# Patient Record
Sex: Female | Born: 1940 | Race: Black or African American | Hispanic: No | Smoking: Former smoker
Health system: Southern US, Community
[De-identification: ages and names within clinical notes are randomized; demographics above are authoritative.]

## PROBLEM LIST (undated history)

## (undated) DIAGNOSIS — E785 Hyperlipidemia, unspecified: Secondary | ICD-10-CM

## (undated) DIAGNOSIS — I10 Essential (primary) hypertension: Secondary | ICD-10-CM

## (undated) DIAGNOSIS — M1990 Unspecified osteoarthritis, unspecified site: Secondary | ICD-10-CM

## (undated) DIAGNOSIS — D333 Benign neoplasm of cranial nerves: Secondary | ICD-10-CM

## (undated) DIAGNOSIS — K8020 Calculus of gallbladder without cholecystitis without obstruction: Secondary | ICD-10-CM

## (undated) DIAGNOSIS — N62 Hypertrophy of breast: Secondary | ICD-10-CM

## (undated) DIAGNOSIS — T4145XA Adverse effect of unspecified anesthetic, initial encounter: Secondary | ICD-10-CM

## (undated) DIAGNOSIS — T7840XA Allergy, unspecified, initial encounter: Secondary | ICD-10-CM

## (undated) DIAGNOSIS — K5730 Diverticulosis of large intestine without perforation or abscess without bleeding: Secondary | ICD-10-CM

## (undated) DIAGNOSIS — T8859XA Other complications of anesthesia, initial encounter: Secondary | ICD-10-CM

## (undated) HISTORY — DX: Essential (primary) hypertension: I10

## (undated) HISTORY — DX: Hyperlipidemia, unspecified: E78.5

## (undated) HISTORY — DX: Benign neoplasm of cranial nerves: D33.3

## (undated) HISTORY — PX: TUBAL LIGATION: SHX77

## (undated) HISTORY — DX: Allergy, unspecified, initial encounter: T78.40XA

## (undated) HISTORY — PX: JOINT REPLACEMENT: SHX530

## (undated) HISTORY — DX: Diverticulosis of large intestine without perforation or abscess without bleeding: K57.30

## (undated) HISTORY — DX: Calculus of gallbladder without cholecystitis without obstruction: K80.20

## (undated) HISTORY — DX: Unspecified osteoarthritis, unspecified site: M19.90

---

## 1980-11-26 HISTORY — PX: CHOLECYSTECTOMY: SHX55

## 2003-11-27 HISTORY — PX: TONSILLECTOMY: SUR1361

## 2003-11-27 HISTORY — PX: REPLACEMENT TOTAL KNEE BILATERAL: SUR1225

## 2003-11-27 HISTORY — PX: OTHER SURGICAL HISTORY: SHX169

## 2005-11-26 HISTORY — PX: OTHER SURGICAL HISTORY: SHX169

## 2006-07-27 HISTORY — PX: OTHER SURGICAL HISTORY: SHX169

## 2006-12-04 ENCOUNTER — Ambulatory Visit: Payer: Self-pay | Admitting: Family Medicine

## 2006-12-27 HISTORY — PX: OTHER SURGICAL HISTORY: SHX169

## 2006-12-31 ENCOUNTER — Ambulatory Visit: Payer: Self-pay | Admitting: Family Medicine

## 2007-01-20 ENCOUNTER — Ambulatory Visit: Payer: Self-pay | Admitting: Family Medicine

## 2007-01-20 LAB — CONVERTED CEMR LAB
HDL: 43.2 mg/dL (ref >39.0)
Total CHOL/HDL Ratio: 4.4
Triglycerides: 56 mg/dL (ref 0–149)
VLDL: 11 mg/dL (ref 0–40)

## 2007-02-13 ENCOUNTER — Ambulatory Visit: Payer: Self-pay | Admitting: Internal Medicine

## 2007-02-18 ENCOUNTER — Other Ambulatory Visit: Admission: RE | Admit: 2007-02-18 | Discharge: 2007-02-18 | Payer: Self-pay | Admitting: Family Medicine

## 2007-02-18 ENCOUNTER — Encounter: Payer: Self-pay | Admitting: Family Medicine

## 2007-02-18 ENCOUNTER — Ambulatory Visit: Payer: Self-pay | Admitting: Family Medicine

## 2007-02-25 ENCOUNTER — Encounter: Payer: Self-pay | Admitting: Family Medicine

## 2007-02-25 ENCOUNTER — Ambulatory Visit: Payer: Self-pay | Admitting: Internal Medicine

## 2007-02-25 LAB — CONVERTED CEMR LAB: Pap Smear: NORMAL

## 2007-05-12 ENCOUNTER — Ambulatory Visit: Payer: Self-pay | Admitting: Family Medicine

## 2007-05-28 ENCOUNTER — Telehealth (INDEPENDENT_AMBULATORY_CARE_PROVIDER_SITE_OTHER): Payer: Self-pay | Admitting: *Deleted

## 2007-06-02 ENCOUNTER — Encounter: Payer: Self-pay | Admitting: Family Medicine

## 2007-07-18 ENCOUNTER — Ambulatory Visit: Payer: Self-pay | Admitting: Family Medicine

## 2007-07-18 DIAGNOSIS — R229 Localized swelling, mass and lump, unspecified: Secondary | ICD-10-CM

## 2007-08-01 ENCOUNTER — Telehealth: Payer: Self-pay | Admitting: Family Medicine

## 2007-08-01 ENCOUNTER — Ambulatory Visit: Payer: Self-pay | Admitting: Family Medicine

## 2007-08-22 ENCOUNTER — Encounter: Payer: Self-pay | Admitting: Family Medicine

## 2007-09-19 ENCOUNTER — Ambulatory Visit: Payer: Self-pay | Admitting: Family Medicine

## 2007-09-22 ENCOUNTER — Encounter: Payer: Self-pay | Admitting: Family Medicine

## 2007-09-22 DIAGNOSIS — K5730 Diverticulosis of large intestine without perforation or abscess without bleeding: Secondary | ICD-10-CM | POA: Insufficient documentation

## 2007-09-22 DIAGNOSIS — E7800 Pure hypercholesterolemia, unspecified: Secondary | ICD-10-CM | POA: Insufficient documentation

## 2007-09-22 DIAGNOSIS — J309 Allergic rhinitis, unspecified: Secondary | ICD-10-CM | POA: Insufficient documentation

## 2007-09-22 DIAGNOSIS — I8390 Asymptomatic varicose veins of unspecified lower extremity: Secondary | ICD-10-CM | POA: Insufficient documentation

## 2007-09-22 DIAGNOSIS — J4520 Mild intermittent asthma, uncomplicated: Secondary | ICD-10-CM | POA: Insufficient documentation

## 2007-09-22 DIAGNOSIS — E669 Obesity, unspecified: Secondary | ICD-10-CM | POA: Insufficient documentation

## 2007-09-22 DIAGNOSIS — M1990 Unspecified osteoarthritis, unspecified site: Secondary | ICD-10-CM

## 2007-09-22 DIAGNOSIS — I10 Essential (primary) hypertension: Secondary | ICD-10-CM | POA: Insufficient documentation

## 2007-09-22 DIAGNOSIS — I83819 Varicose veins of unspecified lower extremities with pain: Secondary | ICD-10-CM | POA: Insufficient documentation

## 2007-09-24 ENCOUNTER — Ambulatory Visit: Payer: Self-pay | Admitting: Family Medicine

## 2007-09-25 DIAGNOSIS — M5386 Other specified dorsopathies, lumbar region: Secondary | ICD-10-CM | POA: Insufficient documentation

## 2007-11-06 ENCOUNTER — Encounter: Payer: Self-pay | Admitting: Family Medicine

## 2008-03-23 ENCOUNTER — Ambulatory Visit: Payer: Self-pay | Admitting: Family Medicine

## 2008-03-23 DIAGNOSIS — R300 Dysuria: Secondary | ICD-10-CM | POA: Insufficient documentation

## 2008-03-23 LAB — CONVERTED CEMR LAB
Bacteria, UA: 0
Epithelial cells, urine: 0 /lpf
Ketones, urine, test strip: NEGATIVE
Mucus, UA: 0
Nitrite: NEGATIVE
RBC / HPF: 0
Specific Gravity, Urine: 1.015
Urine crystals, microscopic: 0 /hpf
Urobilinogen, UA: NEGATIVE

## 2008-03-24 ENCOUNTER — Ambulatory Visit: Payer: Self-pay | Admitting: Family Medicine

## 2008-03-26 LAB — CONVERTED CEMR LAB
ALT: 18 units/L (ref 0–35)
AST: 18 units/L (ref 0–37)
Bilirubin, Direct: 0.1 mg/dL (ref 0.0–0.3)
CO2: 31 meq/L (ref 19–32)
Calcium: 9.2 mg/dL (ref 8.4–10.5)
Chloride: 101 meq/L (ref 96–112)
Glucose, Bld: 81 mg/dL (ref 70–99)
HDL: 33.6 mg/dL — ABNORMAL LOW (ref >39.0)
Sodium: 138 meq/L (ref 135–145)
Total Bilirubin: 0.5 mg/dL (ref 0.3–1.2)
Total CHOL/HDL Ratio: 5.7
Total Protein: 7.5 g/dL (ref 6.0–8.3)
Triglycerides: 63 mg/dL (ref 0–149)

## 2008-03-31 ENCOUNTER — Encounter: Payer: Self-pay | Admitting: Family Medicine

## 2008-06-29 ENCOUNTER — Ambulatory Visit: Payer: Self-pay | Admitting: Family Medicine

## 2008-07-05 LAB — CONVERTED CEMR LAB
ALT: 18 units/L (ref 0–35)
AST: 20 units/L (ref 0–37)
LDL Cholesterol: 127 mg/dL — ABNORMAL HIGH (ref 0–99)
Total CHOL/HDL Ratio: 5

## 2008-07-07 ENCOUNTER — Encounter: Payer: Self-pay | Admitting: Family Medicine

## 2008-08-23 ENCOUNTER — Ambulatory Visit: Payer: Self-pay | Admitting: Family Medicine

## 2008-09-08 ENCOUNTER — Encounter: Payer: Self-pay | Admitting: Family Medicine

## 2008-09-16 ENCOUNTER — Telehealth: Payer: Self-pay | Admitting: Family Medicine

## 2008-09-21 ENCOUNTER — Ambulatory Visit: Payer: Self-pay | Admitting: Family Medicine

## 2008-10-26 ENCOUNTER — Encounter: Payer: Self-pay | Admitting: Family Medicine

## 2008-11-03 ENCOUNTER — Encounter (INDEPENDENT_AMBULATORY_CARE_PROVIDER_SITE_OTHER): Payer: Self-pay | Admitting: *Deleted

## 2008-11-23 ENCOUNTER — Ambulatory Visit: Payer: Self-pay | Admitting: Family Medicine

## 2008-11-23 LAB — CONVERTED CEMR LAB
Bilirubin Urine: NEGATIVE
Glucose, Urine, Semiquant: NEGATIVE
Specific Gravity, Urine: 1.01
WBC Urine, dipstick: NEGATIVE
pH: 6.5

## 2009-01-12 ENCOUNTER — Ambulatory Visit: Payer: Self-pay | Admitting: Family Medicine

## 2009-01-12 LAB — CONVERTED CEMR LAB
Cholesterol: 176 mg/dL (ref 0–200)
VLDL: 18 mg/dL (ref 0–40)

## 2009-03-19 ENCOUNTER — Encounter: Payer: Self-pay | Admitting: Family Medicine

## 2009-03-21 ENCOUNTER — Telehealth: Payer: Self-pay | Admitting: Family Medicine

## 2009-03-22 ENCOUNTER — Ambulatory Visit: Payer: Self-pay | Admitting: Family Medicine

## 2009-03-22 LAB — CONVERTED CEMR LAB
Bilirubin Urine: NEGATIVE
Glucose, Urine, Semiquant: NEGATIVE
Ketones, urine, test strip: NEGATIVE
Specific Gravity, Urine: 1.025

## 2009-03-28 ENCOUNTER — Ambulatory Visit: Payer: Self-pay | Admitting: Family Medicine

## 2009-03-30 ENCOUNTER — Ambulatory Visit: Payer: Self-pay | Admitting: Gastroenterology

## 2009-03-30 DIAGNOSIS — R1031 Right lower quadrant pain: Secondary | ICD-10-CM

## 2009-03-31 ENCOUNTER — Encounter: Payer: Self-pay | Admitting: Gastroenterology

## 2009-03-31 ENCOUNTER — Telehealth: Payer: Self-pay | Admitting: Gastroenterology

## 2009-03-31 LAB — CONVERTED CEMR LAB
Basophils Absolute: 0.1 10*3/uL (ref 0.0–0.1)
Eosinophils Absolute: 0.1 10*3/uL (ref 0.0–0.7)
Hemoglobin: 13.6 g/dL (ref 12.0–15.0)
INR: 1.1 — ABNORMAL HIGH (ref 0.8–1.0)
Lymphocytes Relative: 37.8 % (ref 12.0–46.0)
MCHC: 33 g/dL (ref 30.0–36.0)
MCV: 80.3 fL (ref 78.0–100.0)
Monocytes Absolute: 0.4 10*3/uL (ref 0.1–1.0)
Neutro Abs: 3.8 10*3/uL (ref 1.4–7.7)
Nitrite: POSITIVE
RDW: 14.5 % (ref 11.5–14.6)
Specific Gravity, Urine: 1.015 (ref 1.000–1.030)
Total Protein, Urine: NEGATIVE mg/dL
Urine Glucose: NEGATIVE mg/dL
pH: 6 (ref 5.0–8.0)

## 2009-04-04 ENCOUNTER — Telehealth: Payer: Self-pay | Admitting: Gastroenterology

## 2009-04-07 ENCOUNTER — Telehealth: Payer: Self-pay | Admitting: Gastroenterology

## 2009-04-07 ENCOUNTER — Ambulatory Visit (HOSPITAL_COMMUNITY): Admission: RE | Admit: 2009-04-07 | Discharge: 2009-04-07 | Payer: Self-pay | Admitting: Gastroenterology

## 2009-04-07 ENCOUNTER — Encounter: Payer: Self-pay | Admitting: Gastroenterology

## 2009-04-08 ENCOUNTER — Ambulatory Visit: Payer: Self-pay | Admitting: Family Medicine

## 2009-05-09 ENCOUNTER — Ambulatory Visit: Payer: Self-pay | Admitting: Family Medicine

## 2009-05-09 DIAGNOSIS — M25559 Pain in unspecified hip: Secondary | ICD-10-CM | POA: Insufficient documentation

## 2009-05-10 ENCOUNTER — Ambulatory Visit: Payer: Self-pay | Admitting: Internal Medicine

## 2009-05-10 ENCOUNTER — Encounter: Payer: Self-pay | Admitting: Family Medicine

## 2009-05-10 ENCOUNTER — Encounter: Admission: RE | Admit: 2009-05-10 | Discharge: 2009-05-10 | Payer: Self-pay | Admitting: Family Medicine

## 2009-05-13 ENCOUNTER — Telehealth: Payer: Self-pay | Admitting: Family Medicine

## 2009-05-23 DIAGNOSIS — M8580 Other specified disorders of bone density and structure, unspecified site: Secondary | ICD-10-CM

## 2009-05-24 ENCOUNTER — Encounter (INDEPENDENT_AMBULATORY_CARE_PROVIDER_SITE_OTHER): Payer: Self-pay | Admitting: *Deleted

## 2009-08-11 ENCOUNTER — Telehealth: Payer: Self-pay | Admitting: Family Medicine

## 2009-09-07 ENCOUNTER — Ambulatory Visit: Payer: Self-pay | Admitting: Family Medicine

## 2009-11-24 ENCOUNTER — Telehealth: Payer: Self-pay | Admitting: Family Medicine

## 2010-02-10 ENCOUNTER — Ambulatory Visit: Payer: Self-pay | Admitting: Family Medicine

## 2010-02-10 DIAGNOSIS — J40 Bronchitis, not specified as acute or chronic: Secondary | ICD-10-CM

## 2010-06-13 ENCOUNTER — Ambulatory Visit: Payer: Self-pay | Admitting: Family Medicine

## 2010-06-13 ENCOUNTER — Telehealth (INDEPENDENT_AMBULATORY_CARE_PROVIDER_SITE_OTHER): Payer: Self-pay | Admitting: *Deleted

## 2010-06-13 LAB — CONVERTED CEMR LAB
ALT: 15 units/L (ref 0–35)
AST: 19 units/L (ref 0–37)
BUN: 17 mg/dL (ref 6–23)
CO2: 33 meq/L — ABNORMAL HIGH (ref 19–32)
Chloride: 103 meq/L (ref 96–112)
Cholesterol: 190 mg/dL (ref 0–200)
Creatinine, Ser: 0.7 mg/dL (ref 0.4–1.2)
Glucose, Bld: 81 mg/dL (ref 70–99)
Potassium: 4.2 meq/L (ref 3.5–5.1)
Total Bilirubin: 0.3 mg/dL (ref 0.3–1.2)
Total Protein: 7.5 g/dL (ref 6.0–8.3)
Triglycerides: 62 mg/dL (ref 0.0–149.0)

## 2010-06-26 ENCOUNTER — Ambulatory Visit: Payer: Self-pay | Admitting: Family Medicine

## 2010-06-26 LAB — CONVERTED CEMR LAB

## 2010-07-25 ENCOUNTER — Encounter: Payer: Self-pay | Admitting: Family Medicine

## 2010-07-25 ENCOUNTER — Ambulatory Visit: Payer: Self-pay | Admitting: Family Medicine

## 2010-07-25 LAB — HM MAMMOGRAPHY: HM Mammogram: NORMAL

## 2010-08-03 ENCOUNTER — Encounter (INDEPENDENT_AMBULATORY_CARE_PROVIDER_SITE_OTHER): Payer: Self-pay | Admitting: *Deleted

## 2010-08-03 ENCOUNTER — Encounter: Payer: Self-pay | Admitting: Family Medicine

## 2010-08-30 ENCOUNTER — Ambulatory Visit: Payer: Self-pay | Admitting: Family Medicine

## 2010-11-03 ENCOUNTER — Ambulatory Visit: Payer: Self-pay | Admitting: Family Medicine

## 2010-12-26 NOTE — Assessment & Plan Note (Signed)
Summary: CPX/DLO   Vital Signs:  Patient profile:   70 year old female Height:      62.5 inches Weight:      167.4 pounds BMI:     30.24 Temp:     98.0 degrees F oral Pulse rate:   76 / minute Pulse rhythm:   regular BP sitting:   130 / 86  (left arm) Cuff size:   regular  Vitals Entered By: Benny Lennert CMA [facility]) (June 26, 2010 3:02 PM)  History of Present Illness: Chief complaint adult cpx  The patient is here for annual wellness exam and preventative care.    Has noted left leg peripheral swelling... has venous insufficincy. Has had laser treatment etc.  Has not been wearing compression hose. Intermittant tenderness.  Preventive Screening-Counseling & Management  Caffeine-Diet-Exercise     Diet Comments: healthy     Diet Counseling: to improve diet; diet is suboptimal     Does Patient Exercise: yes     Type of exercise: walking/bike     Exercise (avg: min/session): 30-60     Times/week: 4     Exercise Counseling: to improve exercise regimen  Problems Prior to Update: 1)  Acute Bronchitis  (ICD-466.0) 2)  Osteopenia  (ICD-733.90) 3)  Hip Pain, Right  (ICD-719.45) 4)  Abdominal Pain Right Lower Quadrant  (ICD-789.03) 5)  Need Prophylactic Vaccination&inoculation Flu  (ICD-V04.81) 6)  Dysuria, Chronic  (ICD-788.1) 7)  Varicose Veins, Lower Extremities  (ICD-454.9) 8)  Special Screening For Osteoporosis  (ICD-V82.81) 9)  Sciatica  (ICD-724.3) 10)  Obesity  (ICD-278.00) 11)  Loss, Neural Hearing,unilateral, 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 Take 1 Tablet By Mouth Once A Day 2)  Amlodipine Besylate 10 Mg Tabs (Amlodipine Besylate) .... Take 1 Tablet By Mouth Once A Day 3)  Hydrochlorothiazide 25 Mg Tabs (Hydrochlorothiazide) .... Take 1 Tablet By Mouth Once A Day 4)  Meloxicam 15 Mg Tabs (Meloxicam) .Marland Kitchen.. 1 Tab By Mouth Daily 5)  Proair Hfa 108 (90 Base) Mcg/act Aers (Albuterol Sulfate) .Marland Kitchen.. 1 Inh  Every 4-6 Hours As Needed For Wheeze, Sob  Allergies: 1)  ! * Contrast Dye  Past History:  Past medical, surgical, family and social histories (including risk factors) reviewed, and no changes noted (except as noted below).  Past Medical History: Reviewed history from 03/30/2009 and no changes required. Allergic rhinitis Asthma Diverticulosis, colon Hyperlipidemia Hypertension Osteoarthritis Arthritis Gallstones  Past Surgical History: Reviewed history from 09/22/2007 and no changes required. 2007        MRI brain:  acoustic neuroma (check yearly with MRI) 2005         Bilateral knee replacement                  BTL                  C-Section                  Tonsillectomy 1982  Cholecystectomy 12/2006      PFT's (-)   FEV1/FVC 79%, FEV1 84% 12/2006      CXR 07/2006      Carotid doppler (-)  Family History: Reviewed history from 03/30/2009 and no changes required. Father: Died 76 prostateCA Mother: Died 29, lung CA, CVA Siblings: None No MI < 21  Social History: Reviewed history from 09/22/2007 and no changes required. Former Smoker, 15 PYH (few cigs./day) Alcohol use-no Drug use-no Regular exercise-yes, walks daily Marital Status: Married x 43 years Children: 4, healthy Occupation: Retired, Forensic scientist Diet:  Fruits and veggies, rare FF, no water  Review of Systems General:  Denies fatigue and fever. ENT:  Denies decreased hearing. CV:  Denies chest pain or discomfort. Resp:   Denies shortness of breath. GI:  Denies abdominal pain, bloody stools, constipation, and diarrhea. GU:  Denies dysuria. Neuro:  occ pain in right wrist..no weakness, no numbness. occ back pain. Psych:  Denies anxiety and depression.  Physical Exam  General:  obverweight appearing female in NAD  Ears:  External ear exam shows no significant lesions or deformities.  Otoscopic examination reveals clear canals, tympanic membranes are intact bilaterally without bulging, retraction, inflammation or discharge. Hearing is grossly normal bilaterally. Nose:  External nasal examination shows no deformity or inflammation. Nasal mucosa are pink and moist without lesions or exudates. Mouth:  Oral mucosa and oropharynx without lesions or exudates.  Teeth in good repair. Neck:  no carotid bruit or thyromegaly no cervical or supraclavicular lymphadenopathy  Chest Wall:  No deformities, masses, or tenderness noted. Breasts:  No mass, nodules, thickening, tenderness, bulging, retraction, inflamation, nipple discharge or skin changes noted.   Lungs:  Normal respiratory effort, chest expands symmetrically. Lungs are clear to auscultation, no crackles or wheezes. Heart:  Normal rate and regular rhythm. S1 and S2 normal without gallop, murmur, click, rub or other extra sounds. Abdomen:  Bowel sounds positive,abdomen soft and mild diffse tender without masses, organomegaly or hernias noted. Genitalia:  Pelvic Exam:        External: normal female genitalia without lesions or masses        Vagina: normal without lesions or masses, vaginal atrophy and tightness        Cervix: nml by palpation        Adnexa: normal bimanual exam without masses or fullness        Uterus: normal by palpation        Pap smear: not performed Pulses:  R and L posterior tibial pulses are full and equal bilaterally  Extremities:  2+ left pedal edema.    B varicosities Skin:  Intact without suspicious lesions or rashes Psych:  Cognition  and judgment appear intact. Alert and cooperative with normal attention span and concentration. No apparent delusions, illusions, hallucinations   Impression & Recommendations:  Problem # 1:  VARICOSE VEINS, LOWER EXTREMITIES (ICD-454.9) Prescription for compression hose written.  Elevation  daily above heart. Return to vein doctor as needed.  Problem # 2:  HYPERTENSION (ICD-401.9)  Well controlled. Continue current medication. Encouraged exercise, weight loss, healthy eating habits.  Her updated medication list for this problem includes:    Amlodipine Besylate 10 Mg Tabs (Amlodipine besylate) .Marland Kitchen... Take 1 tablet by mouth once a day    Hydrochlorothiazide 25 Mg Tabs (Hydrochlorothiazide) .Marland Kitchen... Take 1 tablet by mouth once a day  BP today: 130/86 Prior BP: 148/88 (02/10/2010)  Labs Reviewed: K+: 4.2 (06/13/2010) Creat: : 0.7 (06/13/2010)   Chol: 190 (  06/13/2010)   HDL: 46.10 (06/13/2010)   LDL: 132 (06/13/2010)   TG: 62.0 (06/13/2010)  Problem # 3:  HYPERLIPIDEMIA (ICD-272.4) Well controlled with diet and exercsie.   Problem # 4:  Preventive Health Care (ICD-V70.0) Assessment: Comment Only Reviewed preventive care protocols, scheduled due services, and updated immunizations.   Counseled on the importance of diet, exercise, and its role in overall health and mortality. The patient's FH and SH was reviewed, including their home life, tobacco status, and drug and alcohol status.     Complete Medication List: 1)  Bayer Childrens Aspirin 81 Mg Chew (Aspirin) .... Take 1 tablet by mouth once a day 2)  Amlodipine Besylate 10 Mg Tabs (Amlodipine besylate) .... Take 1 tablet by mouth once a day 3)  Hydrochlorothiazide 25 Mg Tabs (Hydrochlorothiazide) .... Take 1 tablet by mouth once a day 4)  Meloxicam 15 Mg Tabs (Meloxicam) .Marland Kitchen.. 1 tab by mouth daily 5)  Proair Hfa 108 (90 Base) Mcg/act Aers (Albuterol sulfate) .Marland Kitchen.. 1 inh  every 4-6 hours as needed for wheeze, sob  Other  Orders: Radiology Referral (Radiology)  Patient Instructions: 1)  Referral Appointment Information 2)  Day/Date: 3)  Time: 4)  Place/MD: 5)  Address: 6)  Phone/Fax: 7)  Patient given appointment information. Information/Orders faxed/mailed.  8)  Contnue healthy eating and regualr exercsie. 9)   Follow up in 1 year for CPX.   Current Allergies (reviewed today): ! * CONTRAST DYE  Last Flu Vaccine:  Fluvax 3+ (09/07/2009 3:37:24 PM) Flu Vaccine Next Due:  1 yr Herpes Zoster Next Due:  Refused Flex Sig Next Due:  Not Indicated Hemoccult Next Due:  Not Indicated Last PAP:  Normal (02/25/2007 9:40:38 AM) PAP Result Date:  06/26/2010 PAP Result:  DVE nml, pap every 3 years, next in 2012 PAP Next Due:  1 yr Last Mammogram:  normal (10/26/2008 10:33:41 PM) Mammogram Next Due:  1 yr     Past Medical History:    Reviewed history from 03/30/2009 and no changes required:       Allergic rhinitis       Asthma       Diverticulosis, colon       Hyperlipidemia       Hypertension       Osteoarthritis       Arthritis       Gallstones  Past Surgical History:    Reviewed history from 09/22/2007 and no changes required:       2007        MRI brain:  acoustic neuroma (check yearly with MRI)       2005         Bilateral knee replacement                        BTL                        C-Section                        Tonsillectomy       1982         Cholecystectomy       12/2006      PFT's (-)   FEV1/FVC 79%, FEV1 84%       12/2006      CXR       07/2006      Carotid doppler (-)

## 2010-12-26 NOTE — Miscellaneous (Signed)
Summary: Clinical Update-Mammogram  Letter mailed to patient advising as instructed.  Linde Gillis CMA [facility])  August 03, 2010 11:22 AM    Clinical Lists Changes  Observations: Added new observation of MAMMO DUE: 07/2011 (08/03/2010 11:21) Added new observation of MAMMOGRAM: normal (07/25/2010 11:21)      Preventive Care Screening  Mammogram:    Date:  07/25/2010    Next Due:  07/2011    Results:  normal

## 2010-12-26 NOTE — Progress Notes (Signed)
----   Converted from flag ---- ---- 06/12/2010 11:17 PM, Kerby Nora MD wrote: CMEt, LIPIDs Dx 401.1   ---- 06/12/2010 1:44 PM, Liane Comber CMA (AAMA) wrote: Pt is scheduled for cpx labs tomorrow, what labs to draw and dx codes? Thanks Tasha ------------------------------

## 2010-12-26 NOTE — Letter (Signed)
Summary: Results Follow up Letter  [place] [facility]  [address] [place], [place] [address]   Phone: [phone]  Fax: [phone]    08/03/2010 MRN: [id number]  [facility] [address] [place], [place]  [address]  Dear Tracy Dixon,  The following are the results of your recent test(s):  Test         Result    Pap Smear:        Normal _____  Not Normal _____ Comments: ______________________________________________________ Cholesterol: LDL(Bad cholesterol):         Your goal is less than:         HDL (Good cholesterol):       Your goal is more than: Comments:  ______________________________________________________ Mammogram:        Normal __X___  Not Normal _____ Comments: Please repeat in one year.  ___________________________________________________________________ Hemoccult:        Normal _____  Not normal _______ Comments:    _____________________________________________________________________ Other Tests:    We routinely do not discuss normal results over the telephone.  If you desire a copy of the results, or you have any questions about this information we can discuss them at your next office visit.   Sincerely,     Hannah Beat, MD

## 2010-12-26 NOTE — Assessment & Plan Note (Signed)
Summary: FLU SHOT/CLE   Nurse Visit   Allergies: 1)  ! * Contrast Dye  Orders Added: 1)  Flu Vaccine 97yrs + MEDICARE PATIENTS [Q2039] 2)  Administration Flu vaccine - MCR [G0008]  Flu Vaccine Consent Questions     Do you have a history of severe allergic reactions to this vaccine? no    Any prior history of allergic reactions to egg and/or gelatin? no    Do you have a sensitivity to the preservative Thimersol? no    Do you have a past history of Guillan-Barre Syndrome? no    Do you currently have an acute febrile illness? no    Have you ever had a severe reaction to latex? no    Vaccine information given and explained to patient? yes    Are you currently pregnant? no    Lot Number:[id number]   Exp Date:05/26/2011   Site Given  Left Deltoid IMu

## 2010-12-26 NOTE — Assessment & Plan Note (Signed)
Summary: COUGH/TIGHT CHEST/DLO   Vital Signs:  Patient profile:   71 year old female Height:      62.5 inches Weight:      168.50 pounds BMI:     30.44 Temp:     97.8 degrees F oral Pulse rate:   66 / minute Pulse rhythm:   regular BP sitting:   130 / 84  (left arm)  Vitals Entered By: Melody Comas (November 03, 2010 3:33 PM) CC: cough, tight chest   History of Present Illness: CC: cough, chest tight  1 1/2 wk h/o coughing as well as this morning chest tightness.  Cough seems to be breaking up but still tight.  Not coughing up sputum but feels stuck in chest.  Some head pressure.  Trouble sleeping at night 2/2 cough.  + sore abd from coughing.    No nasal congestion, sneezing.  No fevesrs/chills, abd pain, n/v/d, rashes, myalgias, arthralgias.  No ear pain or tooth pain  + several sick contacts - husband, son in law, daughter, granddaughter.  Son in law told had bronchitis.  Did have flu shot  + h/o asthma, uses albuterol rarely.  h/o allergies to animals.  No smokers at home.  Current Medications (verified): 1)  Bayer Childrens Aspirin 81 Mg Chew (Aspirin) .... Take 1 Tablet By Mouth Once A Day 2)  Amlodipine Besylate 10 Mg Tabs (Amlodipine Besylate) .... Take 1 Tablet By Mouth Once A Day 3)  Hydrochlorothiazide 25 Mg Tabs (Hydrochlorothiazide) .... Take 1 Tablet By Mouth Once A Day 4)  Proair Hfa 108 (90 Base) Mcg/act Aers (Albuterol Sulfate) .Marland Kitchen.. 1 Inh  Every 4-6 Hours As Needed For Wheeze, Sob  Allergies: 1)  ! * Contrast Dye 2)  ! * Crab Meat  Past History:  Past Medical History: Last updated: 03/30/2009 Allergic rhinitis Asthma Diverticulosis, colon Hyperlipidemia Hypertension Osteoarthritis Arthritis Gallstones  Social History: Former Smoker, 15 PYH (few cigs./day) Alcohol use-no Drug use-no Regular exercise-yes, walks daily Marital Status: Married x 43 years Children: 4, healthy, one Clinical research associate, one pediatrician Occupation: Retired, Teacher, adult education Diet:  Fruits and veggies, rare FF, no water  Review of Systems       per HPI  Physical Exam  General:  overweight appearing female in NAD + cough Head:  sinuses nontender Eyes:  PERRLA, EOMI, no injection Ears:  TMs clear bilaterally Nose:  + congested nares Mouth:  slight pharyngeal erythemat, no exudates Neck:  no carotid bruit or thyromegaly no cervical or supraclavicular lymphadenopathy  Lungs:  good air movement.  + exp wheezing throughout.  normal wob. Heart:  Normal rate and regular rhythm. S1 and S2 normal without gallop, murmur, click, rub or other extra sounds. Pulses:  2+ rad pulses   Impression & Recommendations:  Problem # 1:  ACUTE BRONCHITIS (ICD-466.0) asthmatic bronchitis.  treat with steroid course, albuterol scheduled, and zpack.  red flags to return discussed, supportive care discussed.  Her updated medication list for this problem includes:    Proair Hfa 108 (90 Base) Mcg/act Aers (Albuterol sulfate) .Marland Kitchen... 1 inh  every 4-6 hours as needed for wheeze, sob    Cheratussin Ac 100-10 Mg/8ml Syrp (Guaifenesin-codeine) ..... One teaspoon at night as needed cough    Zithromax Z-pak 250 Mg Tabs (Azithromycin) .Marland Kitchen... Take as directed  Complete Medication List: 1)  Bayer Childrens Aspirin 81 Mg Chew (Aspirin) .... Take 1 tablet by mouth once a day 2)  Amlodipine Besylate 10 Mg Tabs (Amlodipine besylate) .... Take  1 tablet by mouth once a day 3)  Hydrochlorothiazide 25 Mg Tabs (Hydrochlorothiazide) .... Take 1 tablet by mouth once a day 4)  Proair Hfa 108 (90 Base) Mcg/act Aers (Albuterol sulfate) .Marland Kitchen.. 1 inh  every 4-6 hours as needed for wheeze, sob 5)  Prednisone 20 Mg Tabs (Prednisone) .... 2 pills daily for 7 days 6)  Cheratussin Ac 100-10 Mg/61ml Syrp (Guaifenesin-codeine) .... One teaspoon at night as needed cough 7)  Zithromax Z-pak 250 Mg Tabs (Azithromycin) .... Take as directed  Patient Instructions: 1)  I think you have asthmatic  bronchitis. 2)  Treat with course of steroids as well as scheduling albuterol for next 2 days. 3)  Azithromycin course as well. 4)  Guaifenesin IR 400mg  one pill in am and one at noon with plenty of fluid to mobilize mucous. 5)  Cheratussin at night to suppress cough and for some rest. 6)  Please return if you are not improving as expected, or if you have high fevers (>101.5) or difficulty swallowing. 7)  Call clinic with questions.  Pleasure to see you today!  Prescriptions: ZITHROMAX Z-PAK 250 MG TABS (AZITHROMYCIN) take as directed  #1 x 0   Entered and Authorized by:   Eustaquio Boyden  MD   Signed by:   Eustaquio Boyden  MD on 11/03/2010   Method used:   Electronically to        [facility] [address] (retail)       [address], [address]       [place], [place]  [address]       Ph: [phone]       Fax: [phone]   RxID:   [id number] CHERATUSSIN AC 100-10 MG/5ML SYRP (GUAIFENESIN-CODEINE) one teaspoon at night as needed cough  #100cc x 0   Entered and Authorized by:   Eustaquio Boyden  MD   Signed by:   Eustaquio Boyden  MD on 11/03/2010   Method used:   Print then Give to Patient   RxID:   [id number] PREDNISONE 20 MG TABS (PREDNISONE) 2 pills daily for 7 days  #14 x 0   Entered and Authorized by:   Eustaquio Boyden  MD   Signed by:   Eustaquio Boyden  MD on 11/03/2010   Method used:   Electronically to        [facility] [address] (retail)       [address], [address]       [place], [place]  [address]       Ph: [phone]       Fax: [phone]   RxID:   [id number]    Orders Added: 1)  Est. Patient Level III [76[address]]    Current Allergies (reviewed today): ! * CONTRAST DYE ! * CRAB MEAT

## 2010-12-26 NOTE — Assessment & Plan Note (Signed)
Summary: COUGH,FEVER/CLE   Vital Signs:  Patient profile:   70 year old female Height:      62.5 inches Weight:      167.50 pounds BMI:     30.26 Temp:     100.0 degrees F oral Pulse rate:   88 / minute Pulse rhythm:   regular BP sitting:   148 / 88  (left arm) Cuff size:   regular  Vitals Entered By: Delilah Shan CMA [facility]) (February 10, 2010 11:53 AM) CC: Cough, fever, weak   History of Present Illness: 70 yo with 3 days of sujbective fever, chills, productive cough, body aches. Felt nauseated this morning. Nasal congestion. No wheezing or SOB. No vomiting or diarrhea. No CP or HA.   No sore throat.    PMH:  ?asthma, has inhaler at home that she uses occassionally.  Current Medications (verified): 1)  Bayer Childrens Aspirin 81 Mg Chew (Aspirin) .... Take 1 Tablet By Mouth Once A Day 2)  Amlodipine Besylate 10 Mg Tabs (Amlodipine Besylate) .... Take 1 Tablet By Mouth Once A Day 3)  Hydrochlorothiazide 25 Mg Tabs (Hydrochlorothiazide) .... Take 1 Tablet By Mouth Once A Day 4)  Meloxicam 15 Mg Tabs (Meloxicam) .Marland Kitchen.. 1 Tab By Mouth Daily 5)  Proair Hfa 108 (90 Base) Mcg/act Aers (Albuterol Sulfate) .Marland Kitchen.. 1 Inh  Every 4-6 Hours As Needed For Wheeze, Sob 6)  Azithromycin 250 Mg  Tabs (Azithromycin) .... 2 By  Mouth Today and Then 1 Daily For 4 Days  Allergies: 1)  ! * Contrast Dye  Review of Systems      See HPI General:  Complains of chills and fever. ENT:  Complains of nasal congestion and sinus pressure; denies earache and sore throat. CV:  Denies chest pain or discomfort. Resp:  Complains of cough and sputum productive; denies shortness of breath and wheezing.  Physical Exam  General:  Well-developed,well-nourished,in no acute distress; alert,appropriate and cooperative throughout examination Ears:  TMs retracted bilaterally Nose:  mucosal erythema.   Mouth:  Oral mucosa and oropharynx without lesions or exudates.  Teeth in good repair. Lungs:  scant exp wheeze,  right> left No crackles No increased WOB Heart:  Normal rate and regular rhythm. S1 and S2 normal without gallop, murmur, click, rub or other extra sounds. Extremities:  no edema Psych:  Cognition and judgment appear intact. Alert and cooperative with normal attention span and concentration. No apparent delusions, illusions, hallucinations   Impression & Recommendations:  Problem # 1:  ACUTE BRONCHITIS (ICD-466.0) Assessment New Given fever, changes in sputum and PMH ?asthma, will treat with Zpack. Continue Proair as needed for wheezing. Red flags like worsening fever or shorntess of breath given requiring follow up over the weekend. Her updated medication list for this problem includes:    Proair Hfa 108 (90 Base) Mcg/act Aers (Albuterol sulfate) .Marland Kitchen... 1 inh  every 4-6 hours as needed for wheeze, sob    Azithromycin 250 Mg Tabs (Azithromycin) .Marland Kitchen... 2 by  mouth today and then 1 daily for 4 days  Complete Medication List: 1)  Bayer Childrens Aspirin 81 Mg Chew (Aspirin) .... Take 1 tablet by mouth once a day 2)  Amlodipine Besylate 10 Mg Tabs (Amlodipine besylate) .... Take 1 tablet by mouth once a day 3)  Hydrochlorothiazide 25 Mg Tabs (Hydrochlorothiazide) .... Take 1 tablet by mouth once a day 4)  Meloxicam 15 Mg Tabs (Meloxicam) .Marland Kitchen.. 1 tab by mouth daily 5)  Proair Hfa 108 (90 Base) Mcg/act Aers (Albuterol  sulfate) .Marland Kitchen.. 1 inh  every 4-6 hours as needed for wheeze, sob 6)  Azithromycin 250 Mg Tabs (Azithromycin) .... 2 by  mouth today and then 1 daily for 4 days Prescriptions: AZITHROMYCIN 250 MG  TABS (AZITHROMYCIN) 2 by  mouth today and then 1 daily for 4 days  #6 x 0   Entered and Authorized by:   Ruthe Mannan MD   Signed by:   Ruthe Mannan MD on 02/10/2010   Method used:   Electronically to        [facility] [address] (retail)       [address]       [place], [place]  [address]       Ph: [phone]       Fax: [phone]   RxID:    [id number]   Current Allergies (reviewed today): ! * CONTRAST DYE

## 2010-12-26 NOTE — Letter (Signed)
Summary: Results Follow up Letter  [place] [facility]  [address] [place], [place] [address]   Phone: [phone]  Fax: [phone]    08/03/2010 MRN: [id number]  [facility] [address] [place], [place]  [address]  Dear Ms. Thetford,  The following are the results of your recent test(s):  Test         Result    Pap Smear:        Normal _____  Not Normal _____ Comments: ______________________________________________________ Cholesterol: LDL(Bad cholesterol):         Your goal is less than:         HDL (Good cholesterol):       Your goal is more than: Comments:  ______________________________________________________ Mammogram:        Normal __X___  Not Normal _____ Comments:Repeat in year  ___________________________________________________________________ Hemoccult:        Normal _____  Not normal _______ Comments:    _____________________________________________________________________ Other Tests:    We routinely do not discuss normal results over the telephone.  If you desire a copy of the results, or you have any questions about this information we can discuss them at your next office visit.   Sincerely,       Janee Morn, CMA(AAMA)for Dr. Karleen Hampshire Copland

## 2010-12-26 NOTE — Miscellaneous (Signed)
Summary: Flowsheet update   Clinical Lists Changes  Observations: Added new observation of MAMMOGRAM: Normal (07/25/2010 13:11)

## 2011-03-26 ENCOUNTER — Ambulatory Visit (INDEPENDENT_AMBULATORY_CARE_PROVIDER_SITE_OTHER): Payer: Medicare Other | Admitting: Family Medicine

## 2011-03-26 ENCOUNTER — Encounter: Payer: Self-pay | Admitting: Family Medicine

## 2011-03-26 VITALS — BP 144/84 | HR 96 | Temp 98.4°F | Wt 175.1 lb

## 2011-03-26 DIAGNOSIS — R071 Chest pain on breathing: Secondary | ICD-10-CM

## 2011-03-26 DIAGNOSIS — R0789 Other chest pain: Secondary | ICD-10-CM | POA: Insufficient documentation

## 2011-03-26 NOTE — Patient Instructions (Signed)
Ibuprofen 200mg , 2-3 tabs by mouth 2-3 times a day with food.  Take as needed.  This should gradually get better.  Take care.

## 2011-03-26 NOTE — Progress Notes (Signed)
?   Rib fx?  Fell 8 days ago.  Fell into a stool.  Hit anterior R chest.  Pain with deep breath, moving, cough, sneeze. Pain is only on R side, no L sided CP.  No FCNAV.  No LOC.  Asking about options, timeline for healing.  Meds, vitals, and allergies reviewed.   ROS: See HPI.  Otherwise, noncontributory.  nad ncat Mmm Neck with normal rom rrr ctab with equal bs C and T spine not ttp L chest wall not ttp, lower anterior R chest wall ttp w/o bruising Inc in pain with deep breath

## 2011-03-26 NOTE — Assessment & Plan Note (Addendum)
Nsaids with GI caution and fu prn.  Benign exam.  No indication for imaging at this point.  She hasn't used ibuprofen yet.  I would try this and then fu prn.  She agrees.  It should gradually get better over the next few weeks.  Likely a soft tissue injury that will take time to heal.

## 2011-04-13 NOTE — Assessment & Plan Note (Signed)
HEALTHCARE                           [facility] OFFICE NOTE   Tracy, Dixon                        MRN:          [id number]  DATE:12/04/2006                            DOB:          09/30/1941    CHIEF COMPLAINT:  A 70 year old black female, here to establish a new  doctor.   HISTORY OF PRESENT ILLNESS:  Tracy Dixon moved to [place] from [place] in November 2007, with her husband.  They are both retired now.  She comes to the clinic to discuss the following:  1. Hypertension, well-controlled:  She states that she was diagnosed      in the middle of last year with high blood pressure, with an      initial measurement of 160/90.  She was initially treated with an      ACE inhibitor, but this caused a severe cough.  She was then      changed to Hyzaar.  She states that this is controlling her blood      pressure well, but she has also noticed cough symptoms that have      been associated and seem to bother her significantly.  She states      that in the past she had been on amlodipine with no problems.  She      denies chest pain, shortness of breath or palpitations.  2. Bilateral knee pain:  She states that this has come back mildly      over the past one to two months.  She is status post bilateral knee      replacement in 2005.  She states that the pain does not bother her      too much and only occurs with deep bending.  She feels like her      knees are getting more stiff as the years progress.  She      occasionally notes swelling in her knees as well as in her legs,      which she feels is secondary to her varicose veins.   REVIEW OF SYSTEMS:  No headache, no dizziness.  Wears glasses.  Hearing  loss, left ear.  No dyspnea.  No nausea, vomiting, diarrhea or  constipation.   PAST MEDICAL HISTORY:  1. Varicose veins.  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  3. Left hearing loss.  4. Osteoarthritis.  5. Mild  intermittent asthma.  6. Allergic rhinitis.  7. Hypertension.   HOSPITALIZATIONS/SURGERIES/PROCEDURES:  1. In 2007, an MRI of the brain, showing an acoustic neuroma.  The      plan is to check it yearly with an MRI to make sure it is not      growing.  2. In 2005, bilateral knee replacement.  3. BLT.  4. C-section.  5. Tonsillectomy.  6. In 1982, a cholecystectomy.   ALLERGIES:  No known drug allergies.   MEDICATIONS:  1. Hyzaar 50/12.5 mg, one tab daily.  2. Aspirin 81 mg daily.   SOCIAL HISTORY:  A 15-year-history of social tobacco use.  No alcohol  use, no drug use.  Retired Clinical biochemist at [employer], as well as  teaching.  She has been married for 43 years.  She has four children who  are healthy.  She walks daily and is gradually trying to increase this  to lose weight.  She eats fruits, vegetables and rare fast food.   FAMILY HISTORY:  Father deceased at age 27, with lung cancer.  Mother  deceased at age 63, lung cancer and stroke.  No family history of heart  attack before age 25.  No history of diabetes.  She is an only sibling.   PHYSICAL EXAMINATION:  VITAL SIGNS:  Height 62-1/2 inches, weight 173.6  pounds, blood pressure 130/86, pulse 84, temperature 97.8 degrees.  BMI  31.  GENERAL:  A healthy-appearing overweight female, in no apparent  distress.  HEENT:  Pupils equal, round, reactive to light and accommodation.  Extraocular muscles intact.  Oropharynx clear.  Tympanic membranes  clear.  Nares clear.  NECK:  No thyromegaly, no lymphadenopathy supraclavicular or cervical.  Obvious right venous pulse in the neck, greater than the neck, status  post workup.  CARDIOVASCULAR:  A regular rate and rhythm.  No murmurs, rubs or  gallops.  Normal PMI.  With 2+ peripheral pulses.  EXTREMITIES:  No peripheral edema but bilateral varicose veins, greater  on the left than the right.  LUNGS:  Clear to auscultation bilaterally.  No wheezes, rales or  rhonchi.  ABDOMEN:   Soft, obese, nontender.  Normoactive bowel sounds.  No  hepatosplenomegaly.  MUSCULOSKELETAL:  Strength 5/5 in the upper and lower extremities.  Diffuse enlargement bilaterally of the knee, status post knee  replacement.  No specific erythema.  No focal tenderness.  NO effusion.  Full extension and almost full flexion bilaterally.  NEUROLOGIC:  Alert and oriented x3.  Cranial nerves II-XII  grossly  intact.   ASSESSMENT/PLAN:  1. Hypertension, well-controlled:  Given her side effects with Hyzaar,      we will change her to hydrochlorothiazide 25 mg daily.  A      prescription was given for this today.  She will watch her blood      pressure at home.  We can always consider amlodipine, as in the      past it worked well for her, although she is having peripheral      edema and problems with her varicosities, and this may make it      worse.  2. Bilateral knee stiffness:  This is likely secondary to having her      knees replaced several years ago.  There is no reason for an acute      referral to an orthopedist at this point in time.  She will let me      know if her pain begins to bother her more consistently, and at      that point in time we can consider sending her.  In the meantime I      will get records from her knee replacement.  3. Prevention:  She will be scheduled for a colonoscopy.  She will      also return for a Pap smear and breast examination.  She is up-to-      date with mammogram.  I will review her records to find out when      tetanus was last done.  It also appears that she is up-to-date with      a      cholesterol  panel and was previously doing this every six months,      even though it was      normal.  We can review her labs and determine if we need to      continue this frequency.  She was encouraged to increase exercise      and to work on her diet.     Kerby Nora, MD  Electronically Signed    AB/MedQ  DD: 12/04/2006  DT: 12/04/2006  Job #: [id number]

## 2011-07-16 ENCOUNTER — Other Ambulatory Visit: Payer: Self-pay

## 2011-07-19 ENCOUNTER — Telehealth: Payer: Self-pay | Admitting: Family Medicine

## 2011-07-19 DIAGNOSIS — E785 Hyperlipidemia, unspecified: Secondary | ICD-10-CM

## 2011-07-19 DIAGNOSIS — I10 Essential (primary) hypertension: Secondary | ICD-10-CM

## 2011-07-19 DIAGNOSIS — M899 Disorder of bone, unspecified: Secondary | ICD-10-CM

## 2011-07-19 NOTE — Telephone Encounter (Signed)
Message copied by Excell Seltzer on Thu Jul 19, 2011  5:20 PM ------      Message from: Baldomero Lamy      Created: Mon Jul 09, 2011 10:48 AM      Regarding: cpx labs fri 8/24       Please order  future cpx labs for pt's upcomming lab appt.      Thanks      Rodney Booze

## 2011-07-20 ENCOUNTER — Encounter: Payer: Self-pay | Admitting: Family Medicine

## 2011-07-20 ENCOUNTER — Other Ambulatory Visit (INDEPENDENT_AMBULATORY_CARE_PROVIDER_SITE_OTHER): Payer: Medicare Other | Admitting: Family Medicine

## 2011-07-20 DIAGNOSIS — E785 Hyperlipidemia, unspecified: Secondary | ICD-10-CM

## 2011-07-20 DIAGNOSIS — M899 Disorder of bone, unspecified: Secondary | ICD-10-CM

## 2011-07-20 DIAGNOSIS — M949 Disorder of cartilage, unspecified: Secondary | ICD-10-CM

## 2011-07-20 LAB — COMPREHENSIVE METABOLIC PANEL
Albumin: 3.8 g/dL (ref 3.5–5.2)
Alkaline Phosphatase: 58 U/L (ref 39–117)
BUN: 19 mg/dL (ref 6–23)
CO2: 31 mEq/L (ref 19–32)
GFR: 108.06 mL/min (ref >60.00)
Glucose, Bld: 88 mg/dL (ref 70–99)
Sodium: 139 mEq/L (ref 135–145)
Total Bilirubin: 0.4 mg/dL (ref 0.3–1.2)
Total Protein: 7.9 g/dL (ref 6.0–8.3)

## 2011-07-20 LAB — LIPID PANEL
Cholesterol: 188 mg/dL (ref 0–200)
Triglycerides: 66 mg/dL (ref 0.0–149.0)

## 2011-07-24 ENCOUNTER — Encounter: Payer: Medicare Other | Admitting: Family Medicine

## 2011-07-26 ENCOUNTER — Encounter: Payer: Self-pay | Admitting: Family Medicine

## 2011-07-26 ENCOUNTER — Ambulatory Visit (INDEPENDENT_AMBULATORY_CARE_PROVIDER_SITE_OTHER): Payer: Medicare Other | Admitting: Family Medicine

## 2011-07-26 ENCOUNTER — Telehealth: Payer: Self-pay | Admitting: *Deleted

## 2011-07-26 DIAGNOSIS — Z0000 Encounter for general adult medical examination without abnormal findings: Secondary | ICD-10-CM

## 2011-07-26 DIAGNOSIS — Z1231 Encounter for screening mammogram for malignant neoplasm of breast: Secondary | ICD-10-CM

## 2011-07-26 DIAGNOSIS — M545 Low back pain, unspecified: Secondary | ICD-10-CM

## 2011-07-26 DIAGNOSIS — M899 Disorder of bone, unspecified: Secondary | ICD-10-CM

## 2011-07-26 DIAGNOSIS — M949 Disorder of cartilage, unspecified: Secondary | ICD-10-CM

## 2011-07-26 DIAGNOSIS — I10 Essential (primary) hypertension: Secondary | ICD-10-CM

## 2011-07-26 DIAGNOSIS — E785 Hyperlipidemia, unspecified: Secondary | ICD-10-CM

## 2011-07-26 DIAGNOSIS — M8580 Other specified disorders of bone density and structure, unspecified site: Secondary | ICD-10-CM

## 2011-07-26 DIAGNOSIS — G8929 Other chronic pain: Secondary | ICD-10-CM | POA: Insufficient documentation

## 2011-07-26 NOTE — Patient Instructions (Addendum)
Call insurance about shingles vaccine coverage.  Stop at front desk to set up referrals. Look into setting up living will. Work on Designer, fashion/clothing .Marland Kitchen Back and stomach stretches and strengthening exercises. Work on cardiovascular exercise and weight loss.

## 2011-07-26 NOTE — Assessment & Plan Note (Signed)
Due for DEXA. 

## 2011-07-26 NOTE — Telephone Encounter (Signed)
Patient says she forgot to mention to you that she is having cataract surgery in September.

## 2011-07-26 NOTE — Assessment & Plan Note (Signed)
Well controlled. Continue current medication.  

## 2011-07-26 NOTE — Progress Notes (Signed)
Subjective:    Patient ID: Tracy Dixon, female    DOB: 12-Apr-1941, 70 y.o.   MRN: [id number]  HPI  I have personally reviewed the Medicare Annual Wellness questionnaire and have noted 1. The patient's medical and social history 2. Their use of alcohol, tobacco or illicit drugs 3. Their current medications and supplements 4. The patient's functional ability including ADL's, fall risks, home safety risks and hearing or visual             impairment. 5. Diet and physical activities 6. Evidence for depression or mood disorders  The patients weight, height, BMI and visual acuity have been recorded in the chart I have made referrals, counseling and provided education to the patient based review of the above and I have provided the pt with a written personalized care plan for preventive services.  Hypertension:  Well controlled on norvasc and HCTZ. Using medication without problems or lightheadedness:  Chest pain with exertion: None Edema: Daily, wearing compression hose when it is not hot. Has varicose veins. Short of breath: None Average home BPs: Occ, normal at home Other issues:  Elevated Cholesterol: Al most at goal LDL <130 on no medicaiton.  She has noted that she has trouble standing up straight when walking... Has pain resulting in low back.  Gradually noting over last year. If she is leaning on something or pushing things, or sitting she does not have the issue.  Feels better to straighten back.  She is overweight and she has large breasts. No weakness or numbness, no radiating pain to legs. Not doing any stretching or exercise.    Review of Systems     Objective:   Physical Exam  Constitutional: Vital signs are normal. She appears well-developed and well-nourished. She is cooperative.  Non-toxic appearance. She does not appear ill. No distress.  HENT:  Head: Normocephalic.  Right Ear: Hearing, tympanic membrane, external ear and ear canal normal.  Left Ear:  Hearing, tympanic membrane, external ear and ear canal normal.  Nose: Nose normal.  Eyes: Conjunctivae, EOM and lids are normal. Pupils are equal, round, and reactive to light. No foreign bodies found.  Neck: Trachea normal and normal range of motion. Neck supple. Carotid bruit is not present. No mass and no thyromegaly present.  Cardiovascular: Normal rate, regular rhythm, S1 normal, S2 normal, normal heart sounds and intact distal pulses.  Exam reveals no gallop.   No murmur heard. Pulmonary/Chest: Effort normal and breath sounds normal. No respiratory distress. She has no wheezes. She has no rhonchi. She has no rales.  Abdominal: Soft. Normal appearance and bowel sounds are normal. She exhibits no distension, no fluid wave, no abdominal bruit and no mass. There is no hepatosplenomegaly. There is generalized tenderness. There is no rebound, no guarding and no CVA tenderness. No hernia.       Mild diffuse ttp inabdomen to palpation.  Genitourinary: No breast swelling, tenderness, discharge or bleeding. Pelvic exam was performed with patient prone.  Musculoskeletal:       No focal vertebral pain or pain over parapspinous muscles, neg SLR, neg Faber's  Lymphadenopathy:    She has no cervical adenopathy.    She has no axillary adenopathy.  Neurological: She is alert. She has normal strength. No cranial nerve deficit or sensory deficit.  Skin: Skin is warm, dry and intact. No rash noted.  Psychiatric: Her speech is normal and behavior is normal. Judgment normal. Her mood appears not anxious. Cognition and memory are normal. She does  not exhibit a depressed mood.          Assessment & Plan:  Annual Medicare Wellness: The patient's preventative maintenance and recommended screening tests for an annual wellness exam were reviewed in full today. Brought up to date unless services declined.  Counselled on the importance of diet, exercise, and its role in overall health and mortality. The  patient's FH and SH was reviewed, including their home life, tobacco status, and drug and alcohol status.   Up to date with TDap and PNA vaccine, discussed zostavax. Last colonoscopy 2008, next due in 10 years. Mammo: to be scheduled Has had BTL... No pap needed, DVE .Marland KitchenMarland Kitchen  No family history of uterine or ovarian cancer. No vaginal bleeding, no discharge, mild dryness. Bone density: last in 2010 showed osteopenia

## 2011-07-26 NOTE — Assessment & Plan Note (Signed)
Well controlled with diet and exercise.  

## 2011-07-26 NOTE — Assessment & Plan Note (Addendum)
No clear vertebral pain. She does have mild kyposis of upper back and large pendulous breasts.  Most consistent with weak paraspinous muscles and core... Work on Print production planner and weight loss.

## 2011-08-07 ENCOUNTER — Ambulatory Visit: Payer: Self-pay | Admitting: Ophthalmology

## 2011-08-07 DIAGNOSIS — I119 Hypertensive heart disease without heart failure: Secondary | ICD-10-CM

## 2011-08-14 ENCOUNTER — Ambulatory Visit: Payer: Self-pay | Admitting: Ophthalmology

## 2011-08-25 ENCOUNTER — Other Ambulatory Visit: Payer: Self-pay | Admitting: Family Medicine

## 2011-08-28 ENCOUNTER — Ambulatory Visit (INDEPENDENT_AMBULATORY_CARE_PROVIDER_SITE_OTHER): Payer: Medicare Other

## 2011-08-28 ENCOUNTER — Ambulatory Visit: Payer: Medicare Other

## 2011-08-28 DIAGNOSIS — Z23 Encounter for immunization: Secondary | ICD-10-CM

## 2011-09-04 ENCOUNTER — Ambulatory Visit (INDEPENDENT_AMBULATORY_CARE_PROVIDER_SITE_OTHER): Payer: Medicare Other | Admitting: Family Medicine

## 2011-09-04 ENCOUNTER — Encounter: Payer: Self-pay | Admitting: Family Medicine

## 2011-09-04 VITALS — BP 118/72 | HR 88 | Temp 98.4°F | Wt 172.8 lb

## 2011-09-04 DIAGNOSIS — R350 Frequency of micturition: Secondary | ICD-10-CM

## 2011-09-04 DIAGNOSIS — N390 Urinary tract infection, site not specified: Secondary | ICD-10-CM

## 2011-09-04 LAB — POCT URINALYSIS DIPSTICK
Bilirubin, UA: NEGATIVE
Ketones, UA: NEGATIVE
pH, UA: 6

## 2011-09-04 MED ORDER — NITROFURANTOIN MONOHYD MACRO 100 MG PO CAPS: 100.0000 mg | ORAL_CAPSULE | Freq: Two times a day (BID) | ORAL | Status: AC

## 2011-09-04 NOTE — Progress Notes (Signed)
  Subjective:    Patient ID: Tracy Dixon, female    DOB: [birth year], 70 y.o.   MRN: [id number]  HPI  Tracy Dixon, a 70 y.o. female presents today in the office for the following:    Thursday evening started -- now with pain with going to the bathroom. No blood. Feeling like she needs to go.  No back or flank pain. No gross blood.  Urgency  The PMH, PSH, Social History, Family History, Medications, and allergies have been reviewed in [facility], and have been updated if relevant.   Review of Systems ROS: GEN: Acute illness details above GI: Tolerating PO intake GU: maintaining adequate hydration and urination Pulm: No SOB Interactive and getting along well at home.  Otherwise, ROS is as per the HPI.     Objective:   Physical Exam   Physical Exam  Blood pressure 118/72, pulse 88, temperature 98.4 F (36.9 C), temperature source Oral, weight 172 lb 12 oz (78.359 kg).  GEN: WDWN, A&Ox4,NAD. Non-toxic HEENT: Atraumatc, normocephalic. CV: RRR, No M/G/R PULM: CTA B, No wheezes, crackles, or rhonchi ABD: S, NT, ND, +BS, no rebound. No CVAT. mild suprapubic tenderness. EXT: No c/c/e       Assessment & Plan:   1. Urine frequency  POCT urinalysis dipstick  2. UTI (lower urinary tract infection)  nitrofurantoin, macrocrystal-monohydrate, (MACROBID) 100 MG capsule, Urine culture

## 2011-09-06 LAB — URINE CULTURE

## 2011-09-11 ENCOUNTER — Ambulatory Visit: Payer: Medicare Other | Admitting: Family Medicine

## 2011-09-12 ENCOUNTER — Ambulatory Visit: Payer: Self-pay | Admitting: Family Medicine

## 2011-09-13 ENCOUNTER — Encounter: Payer: Self-pay | Admitting: Family Medicine

## 2011-09-13 DIAGNOSIS — M899 Disorder of bone, unspecified: Secondary | ICD-10-CM

## 2011-09-14 ENCOUNTER — Encounter: Payer: Self-pay | Admitting: *Deleted

## 2011-09-14 ENCOUNTER — Encounter: Payer: Self-pay | Admitting: Family Medicine

## 2011-09-28 ENCOUNTER — Ambulatory Visit (INDEPENDENT_AMBULATORY_CARE_PROVIDER_SITE_OTHER): Payer: Medicare Other | Admitting: Family Medicine

## 2011-09-28 ENCOUNTER — Encounter: Payer: Self-pay | Admitting: Family Medicine

## 2011-09-28 DIAGNOSIS — M5442 Lumbago with sciatica, left side: Secondary | ICD-10-CM | POA: Insufficient documentation

## 2011-09-28 DIAGNOSIS — R071 Chest pain on breathing: Secondary | ICD-10-CM

## 2011-09-28 DIAGNOSIS — M549 Dorsalgia, unspecified: Secondary | ICD-10-CM

## 2011-09-28 DIAGNOSIS — R0789 Other chest pain: Secondary | ICD-10-CM | POA: Insufficient documentation

## 2011-09-28 MED ORDER — CYCLOBENZAPRINE HCL 5 MG PO TABS: ORAL_TABLET | ORAL | Status: DC

## 2011-09-28 MED ORDER — TRAMADOL HCL 50 MG PO TABS: 50.0000 mg | ORAL_TABLET | Freq: Four times a day (QID) | ORAL | Status: AC | PRN

## 2011-09-28 NOTE — Assessment & Plan Note (Signed)
Liekly due to contusion and possible rib injury. Recommend pain med as needed.

## 2011-09-28 NOTE — Patient Instructions (Addendum)
Calcium 600 mg  and vit 400 IU  twice daily. Use muscle relaxant prn to help rest at night.  Use tramadol for pain as needed.   Gentle stretching of chest wall as tolerated.  Ice tender areas. Expect several weeks for recovery. Do not wear brace, can restrict breathing and make pneumonia more likely.

## 2011-09-28 NOTE — Assessment & Plan Note (Addendum)
Likely due to musle strain. No focal pain. No sign of spine injury.  Use muscle relaxant prn to help rest at night.   Ice, gentle stretching as able.

## 2011-09-28 NOTE — Progress Notes (Signed)
  Subjective:    Patient ID: Tracy Dixon, female    DOB: Dec 27, 1940, 70 y.o.   MRN: [id number]  HPI  70 year old female fell 1 week ago, lost balance, or tripped. NO CP, no SOB, no dizziness. Landed on left  Leg, arm of chair landed in right chest wall. Contusion  on left leg and right breast. Minimal pain on palpation.. Most painwith deep breath... Pain  In right chest wall and through right back on right. No numbness, no weakness.      Review of Systems  Constitutional: Negative for fever and fatigue.  Respiratory: Negative for shortness of breath.   Cardiovascular: Negative for chest pain, palpitations and leg swelling.  Gastrointestinal: Negative for abdominal pain.       Objective:   Physical Exam  Constitutional: She appears well-developed and well-nourished.  HENT:  Head: Normocephalic.  Eyes: Conjunctivae and EOM are normal. Pupils are equal, round, and reactive to light. Right eye exhibits no discharge. Left eye exhibits no discharge. No scleral icterus.  Neck: Normal range of motion. Neck supple.  Cardiovascular: Normal rate, regular rhythm, normal heart sounds and intact distal pulses.  Exam reveals no gallop and no friction rub.   No murmur heard. Pulmonary/Chest: Effort normal and breath sounds normal. No respiratory distress. She has no wheezes. She has no rales.  Musculoskeletal:       Cervical back: She exhibits decreased range of motion and tenderness.       Thoracic back: She exhibits tenderness and bony tenderness.       ttp over right thoracic spine  Contusion in right breast and over left leg, minimal pain in these areas ttp over lower rib cage anteriorly          Assessment & Plan:

## 2012-03-19 DIAGNOSIS — H2510 Age-related nuclear cataract, unspecified eye: Secondary | ICD-10-CM | POA: Diagnosis not present

## 2012-04-01 DIAGNOSIS — M216X9 Other acquired deformities of unspecified foot: Secondary | ICD-10-CM | POA: Diagnosis not present

## 2012-04-30 ENCOUNTER — Other Ambulatory Visit: Payer: Self-pay | Admitting: Family Medicine

## 2012-06-11 ENCOUNTER — Encounter: Payer: Self-pay | Admitting: Family Medicine

## 2012-06-11 ENCOUNTER — Ambulatory Visit (INDEPENDENT_AMBULATORY_CARE_PROVIDER_SITE_OTHER): Payer: Medicare Other | Admitting: Family Medicine

## 2012-06-11 ENCOUNTER — Ambulatory Visit (INDEPENDENT_AMBULATORY_CARE_PROVIDER_SITE_OTHER)
Admission: RE | Admit: 2012-06-11 | Discharge: 2012-06-11 | Disposition: A | Discharge status: Home / Self Care | Payer: Medicare Other | Source: Ambulatory Visit | Attending: Family Medicine | Admitting: Family Medicine

## 2012-06-11 VITALS — BP 130/74 | HR 92 | Temp 98.2°F | Wt 173.0 lb

## 2012-06-11 DIAGNOSIS — M5137 Other intervertebral disc degeneration, lumbosacral region: Secondary | ICD-10-CM | POA: Diagnosis not present

## 2012-06-11 DIAGNOSIS — M545 Low back pain: Secondary | ICD-10-CM

## 2012-06-11 NOTE — Progress Notes (Signed)
  Subjective:    Patient ID: Tracy Dixon, female    DOB: 12/02/1940, 71 y.o.   MRN: [id number]  HPI  71 year old female with history of low back pain presents with progressively worse diffuse  low back pain. Ongoing for longtime now... Years but getting worse.  Pain is intermittant, hurts when standing a long time, or walking. Feels better with leaning over and pushing something.  Goes away completely with sitting.  In past she has tried  home PT.Marland Kitchen No change in med.  Has not been using any pain med. No radiating pain. No weakness, no numbness, no fever.  Was seen in 09/2012 after fall with chest wall pain and low back pain. This pain feels seperate  His of osteopenia in hips, nml in spine on 2012 DEXA.    Review of Systems  Constitutional: Negative for fever and fatigue.  HENT: Negative for ear pain.   Eyes: Negative for pain.  Respiratory: Negative for chest tightness and shortness of breath.   Cardiovascular: Negative for chest pain, palpitations and leg swelling.  Gastrointestinal: Negative for abdominal pain.  Genitourinary: Negative for dysuria.       Objective:   Physical Exam  Constitutional: Vital signs are normal. She appears well-developed and well-nourished. She is cooperative.  Non-toxic appearance. She does not appear ill. No distress.  HENT:  Head: Normocephalic.  Right Ear: Hearing, tympanic membrane, external ear and ear canal normal. Tympanic membrane is not erythematous, not retracted and not bulging.  Left Ear: Hearing, tympanic membrane, external ear and ear canal normal. Tympanic membrane is not erythematous, not retracted and not bulging.  Nose: No mucosal edema or rhinorrhea. Right sinus exhibits no maxillary sinus tenderness and no frontal sinus tenderness. Left sinus exhibits no maxillary sinus tenderness and no frontal sinus tenderness.  Mouth/Throat: Uvula is midline, oropharynx is clear and moist and mucous membranes are normal.  Eyes: Conjunctivae,  EOM and lids are normal. Pupils are equal, round, and reactive to light. No foreign bodies found.  Neck: Trachea normal and normal range of motion. Neck supple. Carotid bruit is not present. No mass and no thyromegaly present.  Cardiovascular: Normal rate, regular rhythm, S1 normal, S2 normal, normal heart sounds, intact distal pulses and normal pulses.  Exam reveals no gallop and no friction rub.   No murmur heard. Pulmonary/Chest: Effort normal and breath sounds normal. Not tachypneic. No respiratory distress. She has no decreased breath sounds. She has no wheezes. She has no rhonchi. She has no rales.  Abdominal: Soft. Normal appearance and bowel sounds are normal. There is no tenderness.  Musculoskeletal:       Cervical back: Normal.       Thoracic back: Normal.       Lumbar back: She exhibits decreased range of motion. She exhibits no tenderness and no bony tenderness.       Neg SLR, neg Faber's   Neurological: She is alert.  Skin: Skin is warm, dry and intact. No rash noted.  Psychiatric: Her speech is normal and behavior is normal. Judgment and thought content normal. Her mood appears not anxious. Cognition and memory are normal. She does not exhibit a depressed mood.          Assessment & Plan:

## 2012-06-11 NOTE — Patient Instructions (Signed)
We will call with X-ray results.  

## 2012-06-12 ENCOUNTER — Other Ambulatory Visit: Payer: Self-pay | Admitting: Family Medicine

## 2012-06-12 DIAGNOSIS — M545 Low back pain: Secondary | ICD-10-CM

## 2012-06-17 DIAGNOSIS — M545 Low back pain: Secondary | ICD-10-CM | POA: Diagnosis not present

## 2012-06-25 DIAGNOSIS — M545 Low back pain: Secondary | ICD-10-CM | POA: Diagnosis not present

## 2012-06-27 NOTE — Assessment & Plan Note (Signed)
?   If spinal stenosis given pain with standing better with sitting, but usual that she has no focal tendernss to palpation in back at all. Will refer to Pm and R for furrther evaluation and treatment.

## 2012-06-30 DIAGNOSIS — M545 Low back pain: Secondary | ICD-10-CM | POA: Diagnosis not present

## 2012-07-02 DIAGNOSIS — M545 Low back pain: Secondary | ICD-10-CM | POA: Diagnosis not present

## 2012-07-08 DIAGNOSIS — L84 Corns and callosities: Secondary | ICD-10-CM | POA: Diagnosis not present

## 2012-07-09 DIAGNOSIS — M545 Low back pain: Secondary | ICD-10-CM | POA: Diagnosis not present

## 2012-07-14 DIAGNOSIS — M545 Low back pain: Secondary | ICD-10-CM | POA: Diagnosis not present

## 2012-07-16 DIAGNOSIS — M545 Low back pain: Secondary | ICD-10-CM | POA: Diagnosis not present

## 2012-07-18 DIAGNOSIS — M545 Low back pain: Secondary | ICD-10-CM | POA: Diagnosis not present

## 2012-07-21 DIAGNOSIS — M545 Low back pain: Secondary | ICD-10-CM | POA: Diagnosis not present

## 2012-07-23 DIAGNOSIS — M545 Low back pain: Secondary | ICD-10-CM | POA: Diagnosis not present

## 2012-07-25 DIAGNOSIS — M545 Low back pain: Secondary | ICD-10-CM | POA: Diagnosis not present

## 2012-07-29 DIAGNOSIS — M545 Low back pain: Secondary | ICD-10-CM | POA: Diagnosis not present

## 2012-07-31 DIAGNOSIS — M545 Low back pain: Secondary | ICD-10-CM | POA: Diagnosis not present

## 2012-08-04 DIAGNOSIS — M545 Low back pain: Secondary | ICD-10-CM | POA: Diagnosis not present

## 2012-08-05 DIAGNOSIS — M545 Low back pain: Secondary | ICD-10-CM | POA: Diagnosis not present

## 2012-08-05 DIAGNOSIS — M4310 Spondylolisthesis, site unspecified: Secondary | ICD-10-CM | POA: Diagnosis not present

## 2012-08-05 DIAGNOSIS — M5137 Other intervertebral disc degeneration, lumbosacral region: Secondary | ICD-10-CM | POA: Diagnosis not present

## 2012-08-27 DIAGNOSIS — M545 Low back pain: Secondary | ICD-10-CM | POA: Diagnosis not present

## 2012-08-28 ENCOUNTER — Ambulatory Visit (INDEPENDENT_AMBULATORY_CARE_PROVIDER_SITE_OTHER): Payer: Medicare Other

## 2012-08-28 DIAGNOSIS — Z23 Encounter for immunization: Secondary | ICD-10-CM | POA: Diagnosis not present

## 2012-09-09 ENCOUNTER — Telehealth: Payer: Self-pay | Admitting: Family Medicine

## 2012-09-09 DIAGNOSIS — I10 Essential (primary) hypertension: Secondary | ICD-10-CM

## 2012-09-09 DIAGNOSIS — E785 Hyperlipidemia, unspecified: Secondary | ICD-10-CM

## 2012-09-09 DIAGNOSIS — M949 Disorder of cartilage, unspecified: Secondary | ICD-10-CM

## 2012-09-09 NOTE — Telephone Encounter (Signed)
Message copied by Excell Seltzer on Tue Sep 09, 2012  4:58 PM ------      Message from: Alvina Chou      Created: Thu Sep 04, 2012 11:45 AM      Regarding: Lab orders for Thursday 10.17.13       Patient is scheduled for CPX labs, please order future labs, Thanks , Camelia Eng

## 2012-09-10 DIAGNOSIS — M545 Low back pain: Secondary | ICD-10-CM | POA: Diagnosis not present

## 2012-09-11 ENCOUNTER — Other Ambulatory Visit (INDEPENDENT_AMBULATORY_CARE_PROVIDER_SITE_OTHER): Payer: Medicare Other

## 2012-09-11 DIAGNOSIS — M899 Disorder of bone, unspecified: Secondary | ICD-10-CM

## 2012-09-11 DIAGNOSIS — E785 Hyperlipidemia, unspecified: Secondary | ICD-10-CM | POA: Diagnosis not present

## 2012-09-11 DIAGNOSIS — M949 Disorder of cartilage, unspecified: Secondary | ICD-10-CM | POA: Diagnosis not present

## 2012-09-11 LAB — COMPREHENSIVE METABOLIC PANEL
ALT: 21 U/L (ref 0–35)
Alkaline Phosphatase: 53 U/L (ref 39–117)
CO2: 33 mEq/L — ABNORMAL HIGH (ref 19–32)
Sodium: 138 mEq/L (ref 135–145)
Total Bilirubin: 0.4 mg/dL (ref 0.3–1.2)
Total Protein: 8.1 g/dL (ref 6.0–8.3)

## 2012-09-11 LAB — LIPID PANEL
LDL Cholesterol: 112 mg/dL — ABNORMAL HIGH (ref 0–99)
VLDL: 10.8 mg/dL (ref 0.0–40.0)

## 2012-09-12 LAB — VITAMIN D 25 HYDROXY (VIT D DEFICIENCY, FRACTURES): Vit D, 25-Hydroxy: 50 ng/mL (ref 30–89)

## 2012-09-16 ENCOUNTER — Ambulatory Visit: Payer: Self-pay | Admitting: Family Medicine

## 2012-09-16 DIAGNOSIS — Z1231 Encounter for screening mammogram for malignant neoplasm of breast: Secondary | ICD-10-CM | POA: Diagnosis not present

## 2012-09-17 ENCOUNTER — Encounter: Payer: Self-pay | Admitting: Family Medicine

## 2012-09-18 ENCOUNTER — Ambulatory Visit (INDEPENDENT_AMBULATORY_CARE_PROVIDER_SITE_OTHER): Payer: Medicare Other | Admitting: Family Medicine

## 2012-09-18 ENCOUNTER — Encounter: Payer: Self-pay | Admitting: Family Medicine

## 2012-09-18 VITALS — BP 128/80 | HR 91 | Temp 98.0°F | Ht 62.0 in | Wt 169.0 lb

## 2012-09-18 DIAGNOSIS — E785 Hyperlipidemia, unspecified: Secondary | ICD-10-CM | POA: Diagnosis not present

## 2012-09-18 DIAGNOSIS — N390 Urinary tract infection, site not specified: Secondary | ICD-10-CM

## 2012-09-18 DIAGNOSIS — I10 Essential (primary) hypertension: Secondary | ICD-10-CM

## 2012-09-18 DIAGNOSIS — R300 Dysuria: Secondary | ICD-10-CM

## 2012-09-18 DIAGNOSIS — Z0000 Encounter for general adult medical examination without abnormal findings: Secondary | ICD-10-CM

## 2012-09-18 LAB — POCT URINALYSIS DIPSTICK
Nitrite, UA: NEGATIVE
Protein, UA: NEGATIVE
pH, UA: 6.5

## 2012-09-18 MED ORDER — HYDROCHLOROTHIAZIDE 25 MG PO TABS: 25.0000 mg | ORAL_TABLET | Freq: Every day | ORAL | Status: DC

## 2012-09-18 MED ORDER — ALBUTEROL SULFATE HFA 108 (90 BASE) MCG/ACT IN AERS: 1.0000 | INHALATION_SPRAY | RESPIRATORY_TRACT | Status: DC | PRN

## 2012-09-18 MED ORDER — CIPROFLOXACIN HCL 250 MG PO TABS: 250.0000 mg | ORAL_TABLET | Freq: Two times a day (BID) | ORAL | Status: DC

## 2012-09-18 MED ORDER — AMLODIPINE BESYLATE 10 MG PO TABS: 10.0000 mg | ORAL_TABLET | Freq: Every day | ORAL | Status: DC

## 2012-09-18 NOTE — Assessment & Plan Note (Signed)
Well controlled. Continue current medication.  

## 2012-09-18 NOTE — Patient Instructions (Addendum)
Increase fluid intake. Stretching of legs at night, before getting out bed in morning stretch legs as well. Look into shingles vaccine, coverage.  Push fluids. Take cipro x 3 days.  Call if symptoms not resolved by then.  We will call with culture results. Return for follow up UTI appt in  2 weeks to make sure blood resolved.

## 2012-09-18 NOTE — Progress Notes (Signed)
I have personally reviewed the Medicare Annual Wellness questionnaire and have noted  1. The patient's medical and social history  2. Their use of alcohol, tobacco or illicit drugs  3. Their current medications and supplements  4. The patient's functional ability including ADL's, fall risks, home safety risks and hearing or visual  impairment.  5. Diet and physical activities  6. Evidence for depression or mood disorders  The patients weight, height, BMI and visual acuity have been recorded in the chart  I have made referrals, counseling and provided education to the patient based review of the above and I have provided the pt with a written personalized care plan for preventive services.   Hypertension: Well controlled on norvasc and HCTZ.  Using medication without problems or lightheadedness:  Chest pain with exertion: None  Edema: Daily, wearing compression hose when it is not hot. Has varicose veins.  Short of breath: None  Average home BPs: Occ, normal at home  Other issues:   Elevated Cholesterol: Now at goal LDL <130 on no medicaiton. Lab Results  Component Value Date   CHOL 160 09/11/2012   HDL 37.70* 09/11/2012   LDLCALC 112* 09/11/2012   TRIG 54.0 09/11/2012   CHOLHDL 4 09/11/2012     In process of eval for spinal stenosis,steroid injections with Dr.Ramos. If no benefit with second he will refer to back surgeon. Scheduled for steroid shot in nerve on 09/27/2012. She has noted that she has trouble standing up straight when walking... Has pain resulting in low back.  Gradually noting over last few years.  If she is leaning on something or pushing things, or sitting she does not have the issue.  Feels better to straighten back.  She is overweight and she has large breasts.  No weakness or numbness, no radiating pain to legs.   Has appt with Dr. Charlann Boxer for knees.. Not causing pain. Not doing any stretching or exercise.   Review of Systems  Leg cramps in morning, does not  drink much water No depression, no fatgigue She has had some urine frequency and slight burning in last 24 hours. Concerned about UTI.     Objective:   Physical Exam  Constitutional: Vital signs are normal. She appears well-developed and well-nourished. She is cooperative. Non-toxic appearance. She does not appear ill. No distress.  HENT:  Head: Normocephalic.  Right Ear: Hearing, tympanic membrane, external ear and ear canal normal.  Left Ear: Hearing, tympanic membrane, external ear and ear canal normal.  Nose: Nose normal.  Eyes: Conjunctivae, EOM and lids are normal. Pupils are equal, round, and reactive to light. No foreign bodies found.  Neck: Trachea normal and normal range of motion. Neck supple. Carotid bruit is not present. No mass and no thyromegaly present.  Cardiovascular: Normal rate, regular rhythm, S1 normal, S2 normal, normal heart sounds and intact distal pulses. Exam reveals no gallop.  No murmur heard.  Pulmonary/Chest: Effort normal and breath sounds normal. No respiratory distress. She has no wheezes. She has no rhonchi. She has no rales.  Abdominal: Soft. Normal appearance and bowel sounds are normal. She exhibits no distension, no fluid wave, no abdominal bruit and no mass. There is no hepatosplenomegaly. No tenderness.. There is no rebound, no guarding and no CVA tenderness. No hernia.  Genitourinary: No breast swelling, tenderness, discharge or bleeding. Pelvic exam was performed with patient prone.  Musculoskeletal:  No focal vertebral pain or pain over parapspinous muscles, neg SLR, neg Faber's  Lymphadenopathy:  She has  no cervical adenopathy.  She has no axillary adenopathy.  Neurological: She is alert. She has normal strength. No cranial nerve deficit or sensory deficit.  Skin: Skin is warm, dry and intact. No rash noted.  Psychiatric: Her speech is normal and behavior is normal. Judgment normal. Her mood appears not anxious. Cognition and memory are normal.  She does not exhibit a depressed mood.    Assessment & Plan:   Annual Medicare Wellness: The patient's preventative maintenance and recommended screening tests for an annual wellness exam were reviewed in full today.  Brought up to date unless services declined.  Counselled on the importance of diet, exercise, and its role in overall health and mortality.  The patient's FH and SH was reviewed, including their home life, tobacco status, and drug and alcohol status.   Up to date with TDap and PNA vaccine, discussed zostavax.  Last colonoscopy 2008, next due in 10 years.  Mammo: nml 09/16/2012 Has had BTL... No pap needed, no DVE .Marland KitchenMarland Kitchen No family history of uterine or ovarian cancer. No vaginal bleeding, no discharge, mild dryness.  Bone density: last in 2012 showed stable osteopenia

## 2012-09-18 NOTE — Assessment & Plan Note (Signed)
Send for culture. Treat with 3 days of  Cipro. Return in 2 weeks to make sure hematuria resolved.

## 2012-09-18 NOTE — Assessment & Plan Note (Signed)
Great control off medication.

## 2012-09-19 ENCOUNTER — Encounter: Payer: Self-pay | Admitting: *Deleted

## 2012-09-24 ENCOUNTER — Encounter: Payer: Self-pay | Admitting: Family Medicine

## 2012-10-07 ENCOUNTER — Ambulatory Visit (INDEPENDENT_AMBULATORY_CARE_PROVIDER_SITE_OTHER): Payer: Medicare Other | Admitting: Family Medicine

## 2012-10-07 ENCOUNTER — Encounter: Payer: Self-pay | Admitting: Family Medicine

## 2012-10-07 VITALS — Ht 62.0 in

## 2012-10-07 DIAGNOSIS — R319 Hematuria, unspecified: Secondary | ICD-10-CM

## 2012-10-07 DIAGNOSIS — N390 Urinary tract infection, site not specified: Secondary | ICD-10-CM

## 2012-10-07 DIAGNOSIS — M4310 Spondylolisthesis, site unspecified: Secondary | ICD-10-CM | POA: Diagnosis not present

## 2012-10-07 LAB — POCT URINALYSIS DIPSTICK
Blood, UA: NEGATIVE
Protein, UA: NEGATIVE
Spec Grav, UA: 1.02
Urobilinogen, UA: NEGATIVE

## 2012-10-07 NOTE — Assessment & Plan Note (Signed)
Resolved on cipro

## 2012-10-07 NOTE — Assessment & Plan Note (Signed)
Resolved

## 2012-10-07 NOTE — Progress Notes (Signed)
Pt with resolved UTI symptoms. No fever. No dysuria Feels a little lightheaded from spinal injection this morning with back Dr.. No benefit yet.

## 2012-10-21 DIAGNOSIS — M4310 Spondylolisthesis, site unspecified: Secondary | ICD-10-CM | POA: Diagnosis not present

## 2012-10-21 DIAGNOSIS — M5137 Other intervertebral disc degeneration, lumbosacral region: Secondary | ICD-10-CM | POA: Diagnosis not present

## 2012-10-21 DIAGNOSIS — M545 Low back pain: Secondary | ICD-10-CM | POA: Diagnosis not present

## 2012-10-28 ENCOUNTER — Ambulatory Visit (INDEPENDENT_AMBULATORY_CARE_PROVIDER_SITE_OTHER): Payer: Medicare Other | Admitting: Family Medicine

## 2012-10-28 ENCOUNTER — Encounter: Payer: Self-pay | Admitting: Family Medicine

## 2012-10-28 VITALS — BP 120/70 | HR 98 | Temp 98.2°F | Ht 62.0 in | Wt 165.0 lb

## 2012-10-28 DIAGNOSIS — J019 Acute sinusitis, unspecified: Secondary | ICD-10-CM | POA: Insufficient documentation

## 2012-10-28 MED ORDER — AMOXICILLIN 500 MG PO CAPS: 1000.0000 mg | ORAL_CAPSULE | Freq: Two times a day (BID) | ORAL | Status: DC

## 2012-10-28 NOTE — Progress Notes (Signed)
  Subjective:    Patient ID: Tracy Dixon, female    DOB: Jan 15, 1941, 71 y.o.   MRN: [id number]  Sinusitis This is a new problem. The current episode started 1 to 4 weeks ago (13 days ago). The problem has been gradually worsening since onset. There has been no fever. The pain is severe. Associated symptoms include sinus pressure. Pertinent negatives include no congestion, coughing, ear pain, shortness of breath or sore throat. (Started with runny nose now thicker mucus with blood from nose, and severe facial pressure) Treatments tried: coricidin. The treatment provided mild relief.   Most facial pain unilateral on left Former smoker.  Review of Systems  HENT: Positive for sinus pressure. Negative for ear pain, congestion and sore throat.   Respiratory: Negative for cough and shortness of breath.        Objective:   Physical Exam  Constitutional: Vital signs are normal. She appears well-developed and well-nourished. She is cooperative.  Non-toxic appearance. She does not appear ill. No distress.  HENT:  Head: Normocephalic.  Right Ear: Hearing, tympanic membrane, external ear and ear canal normal. Tympanic membrane is not erythematous, not retracted and not bulging.  Left Ear: Hearing, tympanic membrane, external ear and ear canal normal. Tympanic membrane is not erythematous, not retracted and not bulging.  Nose: Mucosal edema and rhinorrhea present. Right sinus exhibits no maxillary sinus tenderness and no frontal sinus tenderness. Left sinus exhibits maxillary sinus tenderness. Left sinus exhibits no frontal sinus tenderness.  Mouth/Throat: Uvula is midline, oropharynx is clear and moist and mucous membranes are normal.  Eyes: Conjunctivae normal, EOM and lids are normal. Pupils are equal, round, and reactive to light. No foreign bodies found.  Neck: Trachea normal and normal range of motion. Neck supple. Carotid bruit is not present. No mass and no thyromegaly present.  Cardiovascular:  Normal rate, regular rhythm, S1 normal, S2 normal, normal heart sounds, intact distal pulses and normal pulses.  Exam reveals no gallop and no friction rub.   No murmur heard. Pulmonary/Chest: Effort normal and breath sounds normal. Not tachypneic. No respiratory distress. She has no decreased breath sounds. She has no wheezes. She has no rhonchi. She has no rales.  Neurological: She is alert.  Skin: Skin is warm, dry and intact. No rash noted.  Psychiatric: Her speech is normal and behavior is normal. Judgment normal. Her mood appears not anxious. Cognition and memory are normal. She does not exhibit a depressed mood.          Assessment & Plan:

## 2012-10-28 NOTE — Patient Instructions (Addendum)
Antibiotics: complete 10 day course. Nasal saline irrigation. Mucinex, or Mucinex DM. Call if not turning the corner in 72 hours.

## 2012-11-03 DIAGNOSIS — M4310 Spondylolisthesis, site unspecified: Secondary | ICD-10-CM | POA: Diagnosis not present

## 2012-11-06 DIAGNOSIS — Z96659 Presence of unspecified artificial knee joint: Secondary | ICD-10-CM | POA: Diagnosis not present

## 2012-11-25 ENCOUNTER — Ambulatory Visit (INDEPENDENT_AMBULATORY_CARE_PROVIDER_SITE_OTHER): Payer: Medicare Other | Admitting: Family Medicine

## 2012-11-25 ENCOUNTER — Encounter: Payer: Self-pay | Admitting: Family Medicine

## 2012-11-25 VITALS — BP 118/76 | HR 93 | Temp 97.7°F | Ht 62.0 in | Wt 165.5 lb

## 2012-11-25 DIAGNOSIS — R300 Dysuria: Secondary | ICD-10-CM | POA: Diagnosis not present

## 2012-11-25 DIAGNOSIS — R350 Frequency of micturition: Secondary | ICD-10-CM | POA: Diagnosis not present

## 2012-11-25 LAB — POCT URINALYSIS DIPSTICK
Bilirubin, UA: NEGATIVE
Glucose, UA: NEGATIVE
Nitrite, UA: NEGATIVE

## 2012-11-25 NOTE — Assessment & Plan Note (Signed)
Not  A definate UTI. Symptoms better and only few wbc on UA and micro.   Will send for culture.  Push fluids.

## 2012-11-25 NOTE — Progress Notes (Signed)
  Subjective:    Patient ID: Tracy Dixon, female    DOB: 12/07/1940, 71 y.o.   MRN: [id number]  Urinary Tract Infection  This is a new (She had UTI earlier in 10/2012, resolved completely., UA clear on recheck.) problem. The current episode started yesterday. The problem occurs intermittently. The problem has been waxing and waning. The quality of the pain is described as burning. The pain is mild. There has been no fever. She is not sexually active. There is no history of pyelonephritis. Associated symptoms include frequency and urgency. Pertinent negatives include no chills, discharge, flank pain, hematuria, sweats or vomiting. She has tried increased fluids for the symptoms. The treatment provided mild relief. There is no history of catheterization, kidney stones, recurrent UTIs, a single kidney, urinary stasis or a urological procedure.      Review of Systems  Constitutional: Negative for chills.  Gastrointestinal: Negative for vomiting.  Genitourinary: Positive for urgency and frequency. Negative for hematuria and flank pain.       Objective:   Physical Exam  Constitutional: Vital signs are normal. She appears well-developed and well-nourished. She is cooperative.  Non-toxic appearance. She does not appear ill. No distress.  HENT:  Head: Normocephalic.  Right Ear: Hearing, tympanic membrane, external ear and ear canal normal. Tympanic membrane is not erythematous, not retracted and not bulging.  Left Ear: Hearing, tympanic membrane, external ear and ear canal normal. Tympanic membrane is not erythematous, not retracted and not bulging.  Nose: No mucosal edema or rhinorrhea. Right sinus exhibits no maxillary sinus tenderness and no frontal sinus tenderness. Left sinus exhibits no maxillary sinus tenderness and no frontal sinus tenderness.  Mouth/Throat: Uvula is midline, oropharynx is clear and moist and mucous membranes are normal.  Eyes: Conjunctivae normal, EOM and lids are normal.  Pupils are equal, round, and reactive to light. No foreign bodies found.  Neck: Trachea normal and normal range of motion. Neck supple. Carotid bruit is not present. No mass and no thyromegaly present.  Cardiovascular: Normal rate, regular rhythm, S1 normal, S2 normal, normal heart sounds, intact distal pulses and normal pulses.  Exam reveals no gallop and no friction rub.   No murmur heard. Pulmonary/Chest: Effort normal and breath sounds normal. Not tachypneic. No respiratory distress. She has no decreased breath sounds. She has no wheezes. She has no rhonchi. She has no rales.  Abdominal: Soft. Normal appearance and bowel sounds are normal. There is no hepatosplenomegaly. There is no tenderness. There is no CVA tenderness.  Neurological: She is alert.  Skin: Skin is warm, dry and intact. No rash noted.  Psychiatric: Her speech is normal and behavior is normal. Judgment and thought content normal. Her mood appears not anxious. Cognition and memory are normal. She does not exhibit a depressed mood.          Assessment & Plan:

## 2012-11-25 NOTE — Patient Instructions (Addendum)
Push fluids.  We will call with urine culture results in next 48 hours. Call us if you do not hear back please.

## 2012-11-27 LAB — URINE CULTURE: Colony Count: 60000

## 2012-12-02 DIAGNOSIS — L84 Corns and callosities: Secondary | ICD-10-CM | POA: Diagnosis not present

## 2013-02-27 DIAGNOSIS — M5137 Other intervertebral disc degeneration, lumbosacral region: Secondary | ICD-10-CM | POA: Diagnosis not present

## 2013-02-27 DIAGNOSIS — M4310 Spondylolisthesis, site unspecified: Secondary | ICD-10-CM | POA: Diagnosis not present

## 2013-02-27 DIAGNOSIS — M545 Low back pain: Secondary | ICD-10-CM | POA: Diagnosis not present

## 2013-03-07 DIAGNOSIS — M4310 Spondylolisthesis, site unspecified: Secondary | ICD-10-CM | POA: Diagnosis not present

## 2013-03-07 DIAGNOSIS — M545 Low back pain: Secondary | ICD-10-CM | POA: Diagnosis not present

## 2013-03-07 DIAGNOSIS — M5137 Other intervertebral disc degeneration, lumbosacral region: Secondary | ICD-10-CM | POA: Diagnosis not present

## 2013-03-15 ENCOUNTER — Encounter: Payer: Self-pay | Admitting: Family Medicine

## 2013-03-17 ENCOUNTER — Encounter: Payer: Self-pay | Admitting: Family Medicine

## 2013-03-17 ENCOUNTER — Ambulatory Visit (INDEPENDENT_AMBULATORY_CARE_PROVIDER_SITE_OTHER): Payer: Medicare Other | Admitting: Family Medicine

## 2013-03-17 VITALS — BP 130/82 | HR 89 | Temp 97.9°F | Ht 62.0 in | Wt 161.5 lb

## 2013-03-17 DIAGNOSIS — M25539 Pain in unspecified wrist: Secondary | ICD-10-CM | POA: Diagnosis not present

## 2013-03-17 DIAGNOSIS — M48061 Spinal stenosis, lumbar region without neurogenic claudication: Secondary | ICD-10-CM | POA: Insufficient documentation

## 2013-03-17 DIAGNOSIS — M25532 Pain in left wrist: Secondary | ICD-10-CM

## 2013-03-17 NOTE — Patient Instructions (Addendum)
Wear brace on left wrist at night. Stop at front desk to set up nerve conduction test. We will call with results. Can use advil for pain if needed. Ice wrist. Home exercises daily.

## 2013-03-17 NOTE — Assessment & Plan Note (Signed)
No sign of cervical radiculopathy.  Most likely carpal tunnel.  Treat with ice, exercises and bracing. NSAID prn pain.  Will send for nerve conduction given symptoms severe and causing weakness. May need referral to hand specialist.

## 2013-03-17 NOTE — Progress Notes (Signed)
  Subjective:    Patient ID: Tracy Dixon, female    DOB: 01-07-1941, 72 y.o.   MRN: [id number]  HPI  72 year old female with history of issue in left wrist (had cortisone shot many years ago, some tunnel issue not carpal tunnel)and low back pain from  lumbar spinal stenosis  She has noted numbness in left hand/all fingers 4 months ago. Worst in thumb and first 2-3 fingers. It has gradually worsened in last few weeks.Marland KitchenMarland KitchenNow constant numbness and pain in wrist.  Feels like she may drop things. Left hand feels weak.Shanon Rosser turn doorknob with left hand at times. No symptoms from arm, shoulder and neck.  Sleeps with left hand under pillow folded.    No know injury.  No fever.     Review of Systems  Constitutional: Negative for fever and fatigue.  HENT: Negative for ear pain.   Eyes: Negative for pain.  Respiratory: Negative for chest tightness and shortness of breath.   Cardiovascular: Negative for chest pain, palpitations and leg swelling.  Gastrointestinal: Negative for abdominal pain.  Genitourinary: Negative for dysuria.       Objective:   Physical Exam  Constitutional: Vital signs are normal. She appears well-developed and well-nourished. She is cooperative.  Non-toxic appearance. She does not appear ill. No distress.  HENT:  Head: Normocephalic.  Right Ear: Hearing, tympanic membrane, external ear and ear canal normal. Tympanic membrane is not erythematous, not retracted and not bulging.  Left Ear: Hearing, tympanic membrane, external ear and ear canal normal. Tympanic membrane is not erythematous, not retracted and not bulging.  Nose: No mucosal edema or rhinorrhea. Right sinus exhibits no maxillary sinus tenderness and no frontal sinus tenderness. Left sinus exhibits no maxillary sinus tenderness and no frontal sinus tenderness.  Mouth/Throat: Uvula is midline, oropharynx is clear and moist and mucous membranes are normal.  Eyes: Conjunctivae, EOM and lids are normal.  Pupils are equal, round, and reactive to light. No foreign bodies found.  Neck: Trachea normal and normal range of motion. Neck supple. Carotid bruit is not present. No mass and no thyromegaly present.  Cardiovascular: Normal rate, regular rhythm, S1 normal, S2 normal, normal heart sounds, intact distal pulses and normal pulses.  Exam reveals no gallop and no friction rub.   No murmur heard. Pulmonary/Chest: Effort normal and breath sounds normal. Not tachypneic. No respiratory distress. She has no decreased breath sounds. She has no wheezes. She has no rhonchi. She has no rales.  Abdominal: Soft. Normal appearance and bowel sounds are normal. There is no tenderness.  Musculoskeletal:       Left wrist: She exhibits decreased range of motion, tenderness and deformity.  Arthritic deformity, positive tinel and phalen left wrist, negative ulnar compresson.  Neurological: She is alert.  Skin: Skin is warm, dry and intact. No rash noted.  Psychiatric: Her speech is normal and behavior is normal. Judgment and thought content normal. Her mood appears not anxious. Cognition and memory are normal. She does not exhibit a depressed mood.          Assessment & Plan:

## 2013-03-24 DIAGNOSIS — M4310 Spondylolisthesis, site unspecified: Secondary | ICD-10-CM | POA: Diagnosis not present

## 2013-04-01 DIAGNOSIS — G5600 Carpal tunnel syndrome, unspecified upper limb: Secondary | ICD-10-CM | POA: Diagnosis not present

## 2013-04-02 ENCOUNTER — Encounter: Payer: Self-pay | Admitting: Family Medicine

## 2013-04-13 DIAGNOSIS — H903 Sensorineural hearing loss, bilateral: Secondary | ICD-10-CM | POA: Diagnosis not present

## 2013-04-13 DIAGNOSIS — D333 Benign neoplasm of cranial nerves: Secondary | ICD-10-CM | POA: Diagnosis not present

## 2013-04-14 ENCOUNTER — Encounter: Payer: Self-pay | Admitting: Family Medicine

## 2013-04-24 ENCOUNTER — Ambulatory Visit (INDEPENDENT_AMBULATORY_CARE_PROVIDER_SITE_OTHER): Payer: Medicare Other | Admitting: Family Medicine

## 2013-04-24 ENCOUNTER — Encounter: Payer: Self-pay | Admitting: Family Medicine

## 2013-04-24 VITALS — BP 130/82 | HR 89 | Temp 98.1°F | Ht 62.0 in | Wt 162.8 lb

## 2013-04-24 DIAGNOSIS — Z01818 Encounter for other preprocedural examination: Secondary | ICD-10-CM | POA: Diagnosis not present

## 2013-04-24 NOTE — Progress Notes (Signed)
  Subjective:    Patient ID: Tracy Dixon, female    DOB: 1941/07/12, 72 y.o.   MRN: [id number]  HPI   72 year old female with history of  HTN, high cholesterol and asthma presnets for pre op evaluation. S he has upcoming back surgery ( lumbar decompression and fusion)  planned for treatment of spinal stenosis. 06/03/2013.  Hypertension:  Well controlled on amlodipine, HCTZ. Using medication without problems or lightheadedness: None Chest pain with exertion:None Edema: occ... Has varicose veins. Short of breath:None Average home WUJ:[id number] Other issues:  Asthma intermittant mild, from dogs/cats: NO SOB. Last episode remotely.   Review of Systems  Constitutional: Negative for fever and fatigue.  HENT: Negative for ear pain.   Eyes: Negative for pain.  Respiratory: Negative for chest tightness and shortness of breath.   Cardiovascular: Negative for chest pain, palpitations and leg swelling.  Gastrointestinal: Negative for abdominal pain.  Genitourinary: Negative for dysuria.       Objective:   Physical Exam  Constitutional: She is oriented to person, place, and time. Vital signs are normal. She appears well-developed and well-nourished. She is cooperative.  Non-toxic appearance. She does not appear ill. No distress.  overweight  HENT:  Head: Normocephalic.  Right Ear: Hearing, tympanic membrane, external ear and ear canal normal. Tympanic membrane is not erythematous, not retracted and not bulging.  Left Ear: Hearing, tympanic membrane, external ear and ear canal normal. Tympanic membrane is not erythematous, not retracted and not bulging.  Nose: Nose normal. No mucosal edema or rhinorrhea. Right sinus exhibits no maxillary sinus tenderness and no frontal sinus tenderness. Left sinus exhibits no maxillary sinus tenderness and no frontal sinus tenderness.  Mouth/Throat: Uvula is midline, oropharynx is clear and moist and mucous membranes are normal.  Eyes: Conjunctivae, EOM and  lids are normal. Pupils are equal, round, and reactive to light. No foreign bodies found.  Neck: Trachea normal and normal range of motion. Neck supple. Carotid bruit is not present. No mass and no thyromegaly present.  Open oropharynx  Cardiovascular: Normal rate, regular rhythm, S1 normal, S2 normal, normal heart sounds, intact distal pulses and normal pulses.  Exam reveals no gallop and no friction rub.   No murmur heard. Pulmonary/Chest: Effort normal and breath sounds normal. Not tachypneic. No respiratory distress. She has no decreased breath sounds. She has no wheezes. She has no rhonchi. She has no rales.  Abdominal: Soft. Normal appearance and bowel sounds are normal. She exhibits no distension, no fluid wave, no abdominal bruit and no mass. There is no hepatosplenomegaly. There is no tenderness. There is no rebound, no guarding and no CVA tenderness. No hernia.  Lymphadenopathy:    She has no cervical adenopathy.    She has no axillary adenopathy.  Neurological: She is alert and oriented to person, place, and time. She has normal strength. No cranial nerve deficit or sensory deficit.  Skin: Skin is warm, dry and intact. No rash noted.  Psychiatric: Her speech is normal and behavior is normal. Judgment and thought content normal. Her mood appears not anxious. Cognition and memory are normal. She does not exhibit a depressed mood.          Assessment & Plan:

## 2013-04-24 NOTE — Patient Instructions (Addendum)
We will fax preop info to Dr. Shon Baton!

## 2013-05-15 DIAGNOSIS — M4310 Spondylolisthesis, site unspecified: Secondary | ICD-10-CM | POA: Diagnosis not present

## 2013-05-15 DIAGNOSIS — M545 Low back pain: Secondary | ICD-10-CM | POA: Diagnosis not present

## 2013-05-15 DIAGNOSIS — M5137 Other intervertebral disc degeneration, lumbosacral region: Secondary | ICD-10-CM | POA: Diagnosis not present

## 2013-05-26 ENCOUNTER — Encounter (HOSPITAL_COMMUNITY): Payer: Self-pay | Admitting: Pharmacy Technician

## 2013-06-01 DIAGNOSIS — M5137 Other intervertebral disc degeneration, lumbosacral region: Secondary | ICD-10-CM | POA: Diagnosis not present

## 2013-06-02 ENCOUNTER — Encounter (HOSPITAL_COMMUNITY)
Admission: RE | Admit: 2013-06-02 | Discharge: 2013-06-02 | Disposition: A | Discharge status: Home / Self Care | Payer: Medicare Other | Source: Ambulatory Visit | Attending: Orthopedic Surgery | Admitting: Orthopedic Surgery

## 2013-06-02 ENCOUNTER — Encounter (HOSPITAL_COMMUNITY): Payer: Self-pay

## 2013-06-02 DIAGNOSIS — Z01812 Encounter for preprocedural laboratory examination: Secondary | ICD-10-CM | POA: Insufficient documentation

## 2013-06-02 DIAGNOSIS — M47817 Spondylosis without myelopathy or radiculopathy, lumbosacral region: Secondary | ICD-10-CM | POA: Diagnosis not present

## 2013-06-02 DIAGNOSIS — Z01818 Encounter for other preprocedural examination: Secondary | ICD-10-CM | POA: Insufficient documentation

## 2013-06-02 DIAGNOSIS — M5137 Other intervertebral disc degeneration, lumbosacral region: Secondary | ICD-10-CM | POA: Diagnosis not present

## 2013-06-02 DIAGNOSIS — J40 Bronchitis, not specified as acute or chronic: Secondary | ICD-10-CM | POA: Diagnosis not present

## 2013-06-02 HISTORY — DX: Other complications of anesthesia, initial encounter: T88.59XA

## 2013-06-02 HISTORY — DX: Adverse effect of unspecified anesthetic, initial encounter: T41.45XA

## 2013-06-02 LAB — TYPE AND SCREEN
ABO/RH(D): A POS
Antibody Screen: NEGATIVE

## 2013-06-02 LAB — BASIC METABOLIC PANEL
Chloride: 99 mEq/L (ref 96–112)
GFR calc Af Amer: >90 mL/min (ref >90)
GFR calc non Af Amer: 87 mL/min — ABNORMAL LOW (ref >90)
Potassium: 3.7 mEq/L (ref 3.5–5.1)
Sodium: 137 mEq/L (ref 135–145)

## 2013-06-02 LAB — CBC
Hemoglobin: 14 g/dL (ref 12.0–15.0)
RBC: 5.36 MIL/uL — ABNORMAL HIGH (ref 3.87–5.11)

## 2013-06-02 LAB — ABO/RH: ABO/RH(D): A POS

## 2013-06-02 LAB — SURGICAL PCR SCREEN: MRSA, PCR: NEGATIVE

## 2013-06-02 NOTE — Pre-Procedure Instructions (Signed)
Tracy Dixon  06/02/2013   Your procedure is scheduled on:  Wednesday June 10, 2013  Report to [facility] at 0630 AM.  Call this number if you have problems the morning of surgery: [phone]   Remember:   Do not eat food or drink liquids after midnight.   Take these medicines the morning of surgery with A SIP OF WATER: Amlodipine and Albuterol inhaler bring day of surgery   Do not wear jewelry, make-up or nail polish.  Do not wear lotions, powders, or perfumes. You may wear deodorant.  Do not shave 48 hours prior to surgery.    Do not bring valuables to the hospital.  [facility] is not responsible                   for any belongings or valuables.  Contacts, dentures or bridgework may not be worn into surgery.  Leave suitcase in the car. After surgery it may be brought to your room.  For patients admitted to the hospital, checkout time is 11:00 AM the day of  discharge.   Patients discharged the day of surgery will not be allowed to drive  home.    Special Instructions: Shower using CHG 2 nights before surgery and the night before surgery.  If you shower the day of surgery use CHG.  Use special wash - you have one bottle of CHG for all showers.  You should use approximately 1/3 of the bottle for each shower.   Please read over the following fact sheets that you were given: Pain Booklet, Coughing and Deep Breathing, Blood Transfusion Information, MRSA Information and Surgical Site Infection Prevention

## 2013-06-05 DIAGNOSIS — M2040 Other hammer toe(s) (acquired), unspecified foot: Secondary | ICD-10-CM | POA: Diagnosis not present

## 2013-06-05 DIAGNOSIS — L84 Corns and callosities: Secondary | ICD-10-CM | POA: Diagnosis not present

## 2013-06-09 MED ORDER — CEFAZOLIN SODIUM-DEXTROSE 2-3 GM-% IV SOLR
2.0000 g | INTRAVENOUS | Status: AC
  Administered 2013-06-10: 2 g via INTRAVENOUS
  Filled 2013-06-09: qty 50

## 2013-06-09 NOTE — Progress Notes (Signed)
Patient instructed to arrive at 0930 am tomorrow for surgery at 1130 am.

## 2013-06-10 ENCOUNTER — Encounter (HOSPITAL_COMMUNITY): Payer: Self-pay | Admitting: Anesthesiology

## 2013-06-10 ENCOUNTER — Inpatient Hospital Stay (HOSPITAL_COMMUNITY): Payer: Medicare Other

## 2013-06-10 ENCOUNTER — Inpatient Hospital Stay (HOSPITAL_COMMUNITY)
Admission: RE | Admit: 2013-06-10 | Discharge: 2013-06-13 | DRG: 460 | Disposition: A | Discharge status: Home Health | Payer: Medicare Other | Source: Ambulatory Visit | Attending: Orthopedic Surgery | Admitting: Orthopedic Surgery

## 2013-06-10 ENCOUNTER — Encounter (HOSPITAL_COMMUNITY)
Admission: RE | Disposition: A | Discharge status: Home Health | Payer: Self-pay | Source: Ambulatory Visit | Attending: Orthopedic Surgery

## 2013-06-10 ENCOUNTER — Inpatient Hospital Stay (HOSPITAL_COMMUNITY): Payer: Medicare Other | Admitting: Anesthesiology

## 2013-06-10 DIAGNOSIS — Z79899 Other long term (current) drug therapy: Secondary | ICD-10-CM | POA: Diagnosis not present

## 2013-06-10 DIAGNOSIS — M545 Low back pain: Secondary | ICD-10-CM | POA: Diagnosis not present

## 2013-06-10 DIAGNOSIS — M539 Dorsopathy, unspecified: Secondary | ICD-10-CM | POA: Diagnosis not present

## 2013-06-10 DIAGNOSIS — M4310 Spondylolisthesis, site unspecified: Secondary | ICD-10-CM | POA: Diagnosis present

## 2013-06-10 DIAGNOSIS — J45909 Unspecified asthma, uncomplicated: Secondary | ICD-10-CM | POA: Diagnosis not present

## 2013-06-10 DIAGNOSIS — I10 Essential (primary) hypertension: Secondary | ICD-10-CM | POA: Diagnosis not present

## 2013-06-10 DIAGNOSIS — Z01812 Encounter for preprocedural laboratory examination: Secondary | ICD-10-CM

## 2013-06-10 DIAGNOSIS — Z96659 Presence of unspecified artificial knee joint: Secondary | ICD-10-CM | POA: Diagnosis not present

## 2013-06-10 DIAGNOSIS — M48061 Spinal stenosis, lumbar region without neurogenic claudication: Secondary | ICD-10-CM | POA: Diagnosis not present

## 2013-06-10 DIAGNOSIS — M5137 Other intervertebral disc degeneration, lumbosacral region: Principal | ICD-10-CM | POA: Diagnosis present

## 2013-06-10 DIAGNOSIS — E785 Hyperlipidemia, unspecified: Secondary | ICD-10-CM | POA: Diagnosis present

## 2013-06-10 DIAGNOSIS — M51379 Other intervertebral disc degeneration, lumbosacral region without mention of lumbar back pain or lower extremity pain: Principal | ICD-10-CM | POA: Diagnosis present

## 2013-06-10 DIAGNOSIS — Q762 Congenital spondylolisthesis: Secondary | ICD-10-CM | POA: Diagnosis not present

## 2013-06-10 DIAGNOSIS — Z7982 Long term (current) use of aspirin: Secondary | ICD-10-CM

## 2013-06-10 DIAGNOSIS — K5730 Diverticulosis of large intestine without perforation or abscess without bleeding: Secondary | ICD-10-CM | POA: Diagnosis not present

## 2013-06-10 DIAGNOSIS — M1990 Unspecified osteoarthritis, unspecified site: Secondary | ICD-10-CM | POA: Diagnosis present

## 2013-06-10 DIAGNOSIS — M549 Dorsalgia, unspecified: Secondary | ICD-10-CM | POA: Diagnosis not present

## 2013-06-10 DIAGNOSIS — M5126 Other intervertebral disc displacement, lumbar region: Secondary | ICD-10-CM | POA: Diagnosis not present

## 2013-06-10 HISTORY — PX: DECOMPRESSIVE LUMBAR LAMINECTOMY LEVEL 3: SHX5793

## 2013-06-10 SURGERY — POSTERIOR LUMBAR FUSION 1 LEVEL
Anesthesia: General | Site: Spine Lumbar | Wound class: Clean

## 2013-06-10 MED ORDER — HYDROMORPHONE HCL PF 1 MG/ML IJ SOLN: 0.2500 mg | INTRAMUSCULAR | Status: DC | PRN

## 2013-06-10 MED ORDER — FENTANYL CITRATE 0.05 MG/ML IJ SOLN
INTRAMUSCULAR | Status: DC | PRN
  Administered 2013-06-10: 50 ug via INTRAVENOUS
  Administered 2013-06-10: 100 ug via INTRAVENOUS
  Administered 2013-06-10 (×5): 50 ug via INTRAVENOUS

## 2013-06-10 MED ORDER — SODIUM CHLORIDE 0.9 % IJ SOLN
3.0000 mL | Freq: Two times a day (BID) | INTRAMUSCULAR | Status: DC
  Administered 2013-06-11 – 2013-06-12 (×2): 3 mL via INTRAVENOUS

## 2013-06-10 MED ORDER — ONDANSETRON HCL 4 MG/2ML IJ SOLN
INTRAMUSCULAR | Status: AC
  Filled 2013-06-10: qty 2

## 2013-06-10 MED ORDER — ZOLPIDEM TARTRATE 5 MG PO TABS
5.0000 mg | ORAL_TABLET | Freq: Every evening | ORAL | Status: DC | PRN
  Filled 2013-06-10: qty 1

## 2013-06-10 MED ORDER — METHOCARBAMOL 500 MG PO TABS
500.0000 mg | ORAL_TABLET | Freq: Four times a day (QID) | ORAL | Status: DC | PRN
  Administered 2013-06-12 – 2013-06-13 (×4): 500 mg via ORAL
  Filled 2013-06-10 (×4): qty 1

## 2013-06-10 MED ORDER — VANCOMYCIN HCL 500 MG IV SOLR
INTRAVENOUS | Status: AC
  Filled 2013-06-10: qty 500

## 2013-06-10 MED ORDER — SODIUM CHLORIDE 0.9 % IJ SOLN
3.0000 mL | INTRAMUSCULAR | Status: DC | PRN
  Administered 2013-06-11: 3 mL via INTRAVENOUS

## 2013-06-10 MED ORDER — LIDOCAINE HCL (CARDIAC) 20 MG/ML IV SOLN
INTRAVENOUS | Status: DC | PRN
  Administered 2013-06-10: 40 mg via INTRAVENOUS
  Administered 2013-06-10: 60 mg via INTRAVENOUS

## 2013-06-10 MED ORDER — OXYCODONE HCL 5 MG PO TABS
10.0000 mg | ORAL_TABLET | ORAL | Status: DC | PRN
  Administered 2013-06-13: 5 mg via ORAL
  Administered 2013-06-13: 10 mg via ORAL
  Filled 2013-06-10 (×2): qty 2
  Filled 2013-06-10: qty 1

## 2013-06-10 MED ORDER — ARTIFICIAL TEARS OP OINT
TOPICAL_OINTMENT | OPHTHALMIC | Status: DC | PRN
  Administered 2013-06-10: 1 via OPHTHALMIC

## 2013-06-10 MED ORDER — THROMBIN 20000 UNITS EX SOLR
CUTANEOUS | Status: AC
  Filled 2013-06-10: qty 20000

## 2013-06-10 MED ORDER — DEXAMETHASONE SODIUM PHOSPHATE 4 MG/ML IJ SOLN
4.0000 mg | Freq: Four times a day (QID) | INTRAMUSCULAR | Status: DC
  Administered 2013-06-11 (×2): 4 mg via INTRAVENOUS
  Filled 2013-06-10 (×6): qty 1

## 2013-06-10 MED ORDER — CEFAZOLIN SODIUM 1-5 GM-% IV SOLN
INTRAVENOUS | Status: AC
  Administered 2013-06-10: 1 g via INTRAVENOUS
  Filled 2013-06-10: qty 50

## 2013-06-10 MED ORDER — MENTHOL 3 MG MT LOZG: 1.0000 | LOZENGE | OROMUCOSAL | Status: DC | PRN

## 2013-06-10 MED ORDER — THROMBIN 20000 UNITS EX KIT
PACK | CUTANEOUS | Status: DC | PRN
  Administered 2013-06-10: 20000 [IU] via TOPICAL

## 2013-06-10 MED ORDER — DEXAMETHASONE SODIUM PHOSPHATE 10 MG/ML IJ SOLN
INTRAMUSCULAR | Status: AC
  Administered 2013-06-10: 10 mg via INTRAVENOUS
  Filled 2013-06-10: qty 1

## 2013-06-10 MED ORDER — PROPOFOL 10 MG/ML IV BOLUS
INTRAVENOUS | Status: DC | PRN
  Administered 2013-06-10: 150 mg via INTRAVENOUS

## 2013-06-10 MED ORDER — ACETAMINOPHEN 10 MG/ML IV SOLN
1000.0000 mg | Freq: Four times a day (QID) | INTRAVENOUS | Status: AC
Start: 2013-06-11 — End: 2013-06-11
  Administered 2013-06-11 (×4): 1000 mg via INTRAVENOUS
  Filled 2013-06-10 (×4): qty 100

## 2013-06-10 MED ORDER — CEFAZOLIN SODIUM 1-5 GM-% IV SOLN
1.0000 g | Freq: Three times a day (TID) | INTRAVENOUS | Status: AC
  Administered 2013-06-11: 1 g via INTRAVENOUS
  Filled 2013-06-10: qty 50

## 2013-06-10 MED ORDER — ROCURONIUM BROMIDE 100 MG/10ML IV SOLN
INTRAVENOUS | Status: DC | PRN
  Administered 2013-06-10: 50 mg via INTRAVENOUS

## 2013-06-10 MED ORDER — AMLODIPINE BESYLATE 10 MG PO TABS
10.0000 mg | ORAL_TABLET | Freq: Every day | ORAL | Status: DC
  Administered 2013-06-13: 10 mg via ORAL
  Filled 2013-06-10 (×3): qty 1

## 2013-06-10 MED ORDER — LACTATED RINGERS IV SOLN
INTRAVENOUS | Status: DC
  Administered 2013-06-10 (×3): via INTRAVENOUS

## 2013-06-10 MED ORDER — MORPHINE SULFATE 10 MG/ML IJ SOLN
INTRAMUSCULAR | Status: DC | PRN
  Administered 2013-06-10 (×3): 2 mg via INTRAVENOUS

## 2013-06-10 MED ORDER — SODIUM CHLORIDE 0.9 % IV SOLN
10.0000 mg | INTRAVENOUS | Status: DC | PRN
  Administered 2013-06-10: 10 ug/min via INTRAVENOUS

## 2013-06-10 MED ORDER — MORPHINE SULFATE 10 MG/ML IJ SOLN: INTRAMUSCULAR | Status: DC | PRN

## 2013-06-10 MED ORDER — LACTATED RINGERS IV SOLN
INTRAVENOUS | Status: DC
  Administered 2013-06-11: 06:00:00 via INTRAVENOUS

## 2013-06-10 MED ORDER — HYDROMORPHONE HCL PF 1 MG/ML IJ SOLN
INTRAMUSCULAR | Status: AC
  Administered 2013-06-10: 0.5 mg via INTRAVENOUS
  Filled 2013-06-10: qty 1

## 2013-06-10 MED ORDER — METHOCARBAMOL 100 MG/ML IJ SOLN
500.0000 mg | Freq: Four times a day (QID) | INTRAVENOUS | Status: DC | PRN
  Filled 2013-06-10: qty 5

## 2013-06-10 MED ORDER — NEOSTIGMINE METHYLSULFATE 1 MG/ML IJ SOLN
INTRAMUSCULAR | Status: DC | PRN
  Administered 2013-06-10: 5 mg via INTRAVENOUS

## 2013-06-10 MED ORDER — 0.9 % SODIUM CHLORIDE (POUR BTL) OPTIME
TOPICAL | Status: DC | PRN
  Administered 2013-06-10: 1000 mL

## 2013-06-10 MED ORDER — OXYCODONE HCL 5 MG/5ML PO SOLN: 5.0000 mg | Freq: Once | ORAL | Status: DC | PRN

## 2013-06-10 MED ORDER — MIDAZOLAM HCL 5 MG/5ML IJ SOLN
INTRAMUSCULAR | Status: DC | PRN
  Administered 2013-06-10 (×2): 1 mg via INTRAVENOUS

## 2013-06-10 MED ORDER — ONDANSETRON HCL 4 MG/2ML IJ SOLN
4.0000 mg | INTRAMUSCULAR | Status: DC | PRN
  Administered 2013-06-10 – 2013-06-11 (×2): 4 mg via INTRAVENOUS
  Filled 2013-06-10: qty 2

## 2013-06-10 MED ORDER — VANCOMYCIN HCL 500 MG IV SOLR
INTRAVENOUS | Status: DC | PRN
  Administered 2013-06-10: 500 mg

## 2013-06-10 MED ORDER — ALBUTEROL SULFATE HFA 108 (90 BASE) MCG/ACT IN AERS
1.0000 | INHALATION_SPRAY | Freq: Four times a day (QID) | RESPIRATORY_TRACT | Status: DC | PRN
  Filled 2013-06-10: qty 6.7

## 2013-06-10 MED ORDER — MORPHINE SULFATE 2 MG/ML IJ SOLN
1.0000 mg | INTRAMUSCULAR | Status: DC | PRN
  Administered 2013-06-11: 2 mg via INTRAVENOUS
  Filled 2013-06-10: qty 1

## 2013-06-10 MED ORDER — OXYCODONE HCL 5 MG PO TABS: 5.0000 mg | ORAL_TABLET | Freq: Once | ORAL | Status: DC | PRN

## 2013-06-10 MED ORDER — DEXAMETHASONE 4 MG PO TABS
4.0000 mg | ORAL_TABLET | Freq: Four times a day (QID) | ORAL | Status: DC
  Filled 2013-06-10 (×6): qty 1

## 2013-06-10 MED ORDER — SODIUM CHLORIDE 0.9 % IV SOLN: 250.0000 mL | INTRAVENOUS | Status: DC

## 2013-06-10 MED ORDER — DEXAMETHASONE SODIUM PHOSPHATE 4 MG/ML IJ SOLN
4.0000 mg | Freq: Once | INTRAMUSCULAR | Status: DC
  Filled 2013-06-10: qty 1

## 2013-06-10 MED ORDER — PHENOL 1.4 % MT LIQD: 1.0000 | OROMUCOSAL | Status: DC | PRN

## 2013-06-10 MED ORDER — ACETAMINOPHEN 10 MG/ML IV SOLN
1000.0000 mg | Freq: Once | INTRAVENOUS | Status: AC
  Administered 2013-06-10: 1000 mg via INTRAVENOUS
  Filled 2013-06-10: qty 100

## 2013-06-10 MED ORDER — VECURONIUM BROMIDE 10 MG IV SOLR
INTRAVENOUS | Status: DC | PRN
  Administered 2013-06-10 (×2): 2 mg via INTRAVENOUS

## 2013-06-10 MED ORDER — HYDROCHLOROTHIAZIDE 25 MG PO TABS
25.0000 mg | ORAL_TABLET | Freq: Every day | ORAL | Status: DC
Start: 2013-06-11 — End: 2013-06-13
  Administered 2013-06-12 – 2013-06-13 (×2): 25 mg via ORAL
  Filled 2013-06-10 (×3): qty 1

## 2013-06-10 MED ORDER — BUPIVACAINE-EPINEPHRINE PF 0.25-1:200000 % IJ SOLN
INTRAMUSCULAR | Status: AC
  Filled 2013-06-10: qty 30

## 2013-06-10 MED ORDER — EPHEDRINE SULFATE 50 MG/ML IJ SOLN
INTRAMUSCULAR | Status: DC | PRN
  Administered 2013-06-10: 10 mg via INTRAVENOUS
  Administered 2013-06-10: 5 mg via INTRAVENOUS

## 2013-06-10 MED ORDER — GLYCOPYRROLATE 0.2 MG/ML IJ SOLN
INTRAMUSCULAR | Status: DC | PRN
  Administered 2013-06-10: 0.6 mg via INTRAVENOUS

## 2013-06-10 MED ORDER — BUPIVACAINE-EPINEPHRINE PF 0.25-1:200000 % IJ SOLN
INTRAMUSCULAR | Status: DC | PRN
  Administered 2013-06-10: 10 mL

## 2013-06-10 MED ORDER — PROMETHAZINE HCL 25 MG/ML IJ SOLN: 6.2500 mg | INTRAMUSCULAR | Status: DC | PRN

## 2013-06-10 MED ORDER — ONDANSETRON HCL 4 MG/2ML IJ SOLN
INTRAMUSCULAR | Status: DC | PRN
  Administered 2013-06-10 (×2): 4 mg via INTRAVENOUS

## 2013-06-10 SURGICAL SUPPLY — 79 items
ADH SKN CLS APL DERMABOND .7 (GAUZE/BANDAGES/DRESSINGS) ×2
BANDAGE GAUZE ELAST BULKY 4 IN (GAUZE/BANDAGES/DRESSINGS) ×3 IMPLANT
BLADE SURG ROTATE 9660 (MISCELLANEOUS) IMPLANT
BUR EGG ELITE 4.0 (BURR) ×3 IMPLANT
CAP SPINAL LOCKING TI (Cap) ×4 IMPLANT
CLOTH BEACON ORANGE TIMEOUT ST (SAFETY) ×3 IMPLANT
CLSR STERI-STRIP ANTIMIC 1/2X4 (GAUZE/BANDAGES/DRESSINGS) ×1 IMPLANT
CORDS BIPOLAR (ELECTRODE) ×3 IMPLANT
COVER MAYO STAND STRL (DRAPES) ×6 IMPLANT
COVER SURGICAL LIGHT HANDLE (MISCELLANEOUS) ×3 IMPLANT
DERMABOND ADVANCED (GAUZE/BANDAGES/DRESSINGS) ×1
DERMABOND ADVANCED .7 DNX12 (GAUZE/BANDAGES/DRESSINGS) ×2 IMPLANT
DRAPE C-ARM 42X72 X-RAY (DRAPES) ×3 IMPLANT
DRAPE ORTHO SPLIT 77X108 STRL (DRAPES) ×3
DRAPE POUCH INSTRU U-SHP 10X18 (DRAPES) ×3 IMPLANT
DRAPE SURG 17X11 SM STRL (DRAPES) ×3 IMPLANT
DRAPE SURG 17X23 STRL (DRAPES) ×3 IMPLANT
DRAPE SURG ORHT 6 SPLT 77X108 (DRAPES) ×2 IMPLANT
DRAPE TABLE COVER HEAVY DUTY (DRAPES) ×1 IMPLANT
DRAPE U-SHAPE 47X51 STRL (DRAPES) ×3 IMPLANT
DRSG MEPILEX BORDER 4X4 (GAUZE/BANDAGES/DRESSINGS) ×3 IMPLANT
DRSG MEPILEX BORDER 4X8 (GAUZE/BANDAGES/DRESSINGS) ×3 IMPLANT
DURAPREP 26ML APPLICATOR (WOUND CARE) ×3 IMPLANT
ELECT BLADE 4.0 EZ CLEAN MEGAD (MISCELLANEOUS) ×3
ELECT BLADE 6.5 EXT (BLADE) ×2 IMPLANT
ELECT CAUTERY BLADE 6.4 (BLADE) ×3 IMPLANT
ELECT REM PT RETURN 9FT ADLT (ELECTROSURGICAL) ×3
ELECTRODE BLDE 4.0 EZ CLN MEGD (MISCELLANEOUS) ×2 IMPLANT
ELECTRODE REM PT RTRN 9FT ADLT (ELECTROSURGICAL) ×2 IMPLANT
FLOSEAL (HEMOSTASIS) IMPLANT
GLOVE BIOGEL PI IND STRL 6.5 (GLOVE) ×2 IMPLANT
GLOVE BIOGEL PI IND STRL 8.5 (GLOVE) ×2 IMPLANT
GLOVE BIOGEL PI INDICATOR 6.5 (GLOVE) ×1
GLOVE BIOGEL PI INDICATOR 8.5 (GLOVE) ×1
GLOVE ECLIPSE 6.0 STRL STRAW (GLOVE) ×3 IMPLANT
GLOVE ECLIPSE 8.5 STRL (GLOVE) ×3 IMPLANT
GOWN PREVENTION PLUS XXLARGE (GOWN DISPOSABLE) ×3 IMPLANT
GOWN STRL NON-REIN LRG LVL3 (GOWN DISPOSABLE) ×6 IMPLANT
IV CATH 14GX2 1/4 (CATHETERS) ×1 IMPLANT
KIT BASIN OR (CUSTOM PROCEDURE TRAY) ×3 IMPLANT
KIT POSITION SURG JACKSON T1 (MISCELLANEOUS) ×2 IMPLANT
KIT ROOM TURNOVER OR (KITS) ×3 IMPLANT
KIT STIMULAN RAPID CURE 5CC (Orthopedic Implant) ×1 IMPLANT
NDL SPNL 18GX3.5 QUINCKE PK (NEEDLE) ×4 IMPLANT
NEEDLE 22X1 1/2 (OR ONLY) (NEEDLE) ×3 IMPLANT
NEEDLE SPNL 18GX3.5 QUINCKE PK (NEEDLE) ×6 IMPLANT
NS IRRIG 1000ML POUR BTL (IV SOLUTION) ×4 IMPLANT
PACK LAMINECTOMY ORTHO (CUSTOM PROCEDURE TRAY) ×3 IMPLANT
PACK UNIVERSAL I (CUSTOM PROCEDURE TRAY) ×3 IMPLANT
PAD ARMBOARD 7.5X6 YLW CONV (MISCELLANEOUS) ×6 IMPLANT
PATTIES SURGICAL .5 X.5 (GAUZE/BANDAGES/DRESSINGS) IMPLANT
PATTIES SURGICAL .5 X1 (DISPOSABLE) ×4 IMPLANT
PUTTY DBX 5CC (Putty) ×1 IMPLANT
ROD 40MM (Rod) ×3 IMPLANT
ROD 45MM (Rod) ×3 IMPLANT
ROD SPNL CVD 40X5.5XHRD NS (Rod) IMPLANT
ROD SPNL CVD 45X5.5XHRD NS (Rod) IMPLANT
SCREW MATRIX MIS 6.0X40MM (Screw) ×3 IMPLANT
SCREW MATRIX MIS 6.0X45MM (Screw) ×1 IMPLANT
SPONGE LAP 4X18 X RAY DECT (DISPOSABLE) ×8 IMPLANT
SPONGE SURGIFOAM ABS GEL 100 (HEMOSTASIS) ×3 IMPLANT
STAPLER VISISTAT 35W (STAPLE) IMPLANT
STRIP CLOSURE SKIN 1/2X4 (GAUZE/BANDAGES/DRESSINGS) ×3 IMPLANT
SURGIFLO TRUKIT (HEMOSTASIS) ×2 IMPLANT
SUT MNCRL AB 3-0 PS2 18 (SUTURE) ×6 IMPLANT
SUT VIC AB 1 CT1 18XCR BRD 8 (SUTURE) IMPLANT
SUT VIC AB 1 CT1 27 (SUTURE) ×6
SUT VIC AB 1 CT1 27XBRD ANTBC (SUTURE) ×4 IMPLANT
SUT VIC AB 1 CT1 8-18 (SUTURE) ×3
SUT VIC AB 2-0 CT1 18 (SUTURE) ×6 IMPLANT
SUT VICRYL 0 UR6 27IN ABS (SUTURE) ×6 IMPLANT
SYR BULB IRRIGATION 50ML (SYRINGE) ×3 IMPLANT
SYR CONTROL 10ML LL (SYRINGE) ×6 IMPLANT
TAP CANN 5MM (TAP) ×1 IMPLANT
TOWEL OR 17X24 6PK STRL BLUE (TOWEL DISPOSABLE) ×3 IMPLANT
TOWEL OR 17X26 10 PK STRL BLUE (TOWEL DISPOSABLE) ×6 IMPLANT
TRAY FOLEY CATH 16FRSI W/METER (SET/KITS/TRAYS/PACK) ×2 IMPLANT
WATER STERILE IRR 1000ML POUR (IV SOLUTION) ×2 IMPLANT
YANKAUER SUCT BULB TIP NO VENT (SUCTIONS) ×3 IMPLANT

## 2013-06-10 NOTE — H&P (Signed)
History of Present Illness The patient is a 72 year old female who presents today for follow up of their back. The patient is being followed for their central back pain. Symptoms reported today include: pain (lower lumbar radiating into bilat. lower ext. ) and weakness (heaviness bilat. lower ext. ), while the patient does not report symptoms of: numbness or urinary incontinence. Current treatment includes: NSAIDs. The following medication has been used for pain control: antiinflammatory medication (Advil ). The patient presents today following discussion of surgery with literature. Surgery sch. on [id number] here for pre op. .    Subjective Transcription Tracy Dixon returns today for a pre-operative evaluation. All questions were encouraged and answered. We have gone over the risks and benefits of the surgery.    Allergies Iodinated Contrast   Family History Osteoarthritis. mother Cerebrovascular Accident. mother Cancer. mother, father, grandmother mothers side and grandfather mothers side   Social History Number of flights of stairs before winded. 1 Marital status. married Pain Contract. no Tobacco use. former smoker; smoke(d) less than 1/2 pack(s) per day Children. 4 Alcohol use. never consumed alcohol Current work status. working part time Living situation. live with spouse Exercise. Exercises monthly Tobacco / smoke exposure. no   Pregnancy / Birth History Pregnant. no   Past Surgical History Gallbladder Surgery. open Tonsillectomy Total Knee Replacement. bilateral Arthroscopy of Knee. left Cataract Surgery. left Cesarean Delivery. 3 or more times   Other Problems Osteoarthritis High blood pressure Asthma  EXAM:   A+O X3, no sob/cp.  Abd soft/NT.  1+ DP/PT pulses bilaterally.  EHL/TA/GA intact bilaterally.  Decreased sensation to LT in L4 and 5 distribution bilaterally.  Compartments soft.NT  FROM without pain hip/knee/ankle.    She has significant back pain once she attempts to return to neutral or extend. Relief with forward flexion. No shortness of breath or chest pain. Abdomen is soft and nontender. No real hip or ankle pain with joint range of motion.  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) Current Plans l Risks of surgery include, but are not limited to: Death, stroke, paralysis, nerve root damage/injury, bleeding, blood clots, loss of bowel/bladder control, sexual dysfunction, retrograde ejaculation, hardware failure, or malposition, spinal fluid leak, adjacent segment disease, non-union, need for further surgery, ongoing or worse pain, injury to bladder, bowel and abdominal contents, infection and recurrent disc herniation  l We have gone over the risks and benefits of surgery, which include infection, bleeding, nerve damage, death, stroke, paralysis, failure to heal, need for further surgery, ongoing or worse pain, loss of fixation, need for further surgery, CSF leak, loss of bowel or bladder control, ongoing or worse pain.   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

## 2013-06-10 NOTE — Transfer of Care (Signed)
Immediate Anesthesia Transfer of Care Note  Patient: Tracy Dixon  Procedure(s) Performed: Procedure(s) with comments: POSTERIOR SPINAL FUSION L4-L5   1 LEVEL (N/A) - lumbar four to five posterior fusion DECOMPRESSIVE LUMBAR LAMINECTOMY LEVEL 3 - lumbar three to five decompression  Patient Location: PACU  Anesthesia Type:General  Level of Consciousness: sedated, patient cooperative and responds to stimulation  Airway & Oxygen Therapy: Patient Spontanous Breathing and Patient connected to face mask oxygen  Post-op Assessment: Report given to PACU RN, Post -op Vital signs reviewed and stable, Patient moving all extremities and Patient moving all extremities X 4  Post vital signs: Reviewed and stable  Complications: No apparent anesthesia complications

## 2013-06-10 NOTE — Anesthesia Preprocedure Evaluation (Addendum)
Anesthesia Evaluation  Patient identified by MRN, date of birth, ID band Patient awake    Reviewed: Allergy & Precautions, H&P , NPO status   History of Anesthesia Complications (+) PROLONGED EMERGENCE  Airway Mallampati: I  Neck ROM: Full    Dental   Pulmonary asthma ,  breath sounds clear to auscultation        Cardiovascular hypertension, Rhythm:Regular Rate:Normal     Neuro/Psych    GI/Hepatic negative GI ROS, Neg liver ROS,   Endo/Other  negative endocrine ROS  Renal/GU negative Renal ROS     Musculoskeletal  (+) Arthritis -,   Abdominal   Peds  Hematology negative hematology ROS (+)   Anesthesia Other Findings   Reproductive/Obstetrics                          Anesthesia Physical Anesthesia Plan  ASA: II  Anesthesia Plan: General   Post-op Pain Management:    Induction: Intravenous  Airway Management Planned: Oral ETT  Additional Equipment:   Intra-op Plan:   Post-operative Plan: Extubation in OR  Informed Consent:   Dental advisory given  Plan Discussed with: CRNA and Surgeon  Anesthesia Plan Comments:         Anesthesia Quick Evaluation

## 2013-06-10 NOTE — Anesthesia Postprocedure Evaluation (Signed)
Anesthesia Post Note  Patient: Tracy Dixon  Procedure(s) Performed: Procedure(s) (LRB): POSTERIOR SPINAL FUSION L4-L5   1 LEVEL (N/A) DECOMPRESSIVE LUMBAR LAMINECTOMY LEVEL 3  Anesthesia type: general  Patient location: PACU  Post pain: Pain level controlled  Post assessment: Patient's Cardiovascular Status Stable  Last Vitals:  Filed Vitals:   06/10/13 1730  BP: 94/41  Pulse: 83  Temp:   Resp: 13    Post vital signs: Reviewed and stable  Level of consciousness: sedated  Complications: No apparent anesthesia complications

## 2013-06-10 NOTE — Anesthesia Procedure Notes (Addendum)
Procedure Name: Intubation Date/Time: 06/10/2013 12:14 PM Performed by: Wray Kearns A Pre-anesthesia Checklist: Patient identified, Timeout performed, Emergency Drugs available, Suction available and Patient being monitored Patient Re-evaluated:Patient Re-evaluated prior to inductionOxygen Delivery Method: Circle system utilized Preoxygenation: Pre-oxygenation with 100% oxygen Intubation Type: IV induction and Cricoid Pressure applied Ventilation: Mask ventilation without difficulty Laryngoscope Size: Miller and 2 Grade View: Grade I Tube type: Oral Tube size: 7.0 mm Number of attempts: 1 Airway Equipment and Method: Stylet Placement Confirmation: breath sounds checked- equal and bilateral and positive ETCO2 Secured at: 22 cm Tube secured with: Tape Dental Injury: Teeth and Oropharynx as per pre-operative assessment  Comments: Pt Intubation per A. Bartholomew Boards , Scientist, clinical (histocompatibility and immunogenetics) .

## 2013-06-10 NOTE — Preoperative (Signed)
Beta Blockers   Reason not to administer Beta Blockers:Not Applicable 

## 2013-06-10 NOTE — Brief Op Note (Addendum)
06/10/2013  3:53 PM  PATIENT:  Cordelia Pen  72 y.o. female  PRE-OPERATIVE DIAGNOSIS:  spinal stenosis with degenerative slip L4-L5   POST-OPERATIVE DIAGNOSIS:  spinal stenosis with degenerative slip L4-L5   PROCEDURE:  Procedure(s) with comments: POSTERIOR SPINAL FUSION L4-L5   1 LEVEL (N/A) - lumbar four to five posterior fusion DECOMPRESSIVE LUMBAR LAMINECTOMY LEVEL 3 - lumbar three to five decompression  SURGEON:  Surgeon(s) and Role:    * Venita Lick, MD - Primary  PHYSICIAN ASSISTANT:   ASSISTANTS: none   ANESTHESIA:   general  EBL:  Total I/O In: 2000 [I.V.:2000] Out: 600 [Urine:350; Blood:250]  BLOOD ADMINISTERED:none  DRAINS: none   LOCAL MEDICATIONS USED:  MARCAINE     SPECIMEN:  No Specimen  DISPOSITION OF SPECIMEN:  N/A  COUNTS:  NO - neural patty 1/2x1 not found.  Xray negative.  Wound explored no patty found.    TOURNIQUET:  * No tourniquets in log *  DICTATION: .Other Dictation: Dictation Number [id number]  PLAN OF CARE: Admit to inpatient   PATIENT DISPOSITION:  PACU - hemodynamically stable.

## 2013-06-11 NOTE — Progress Notes (Signed)
SW received a consult for possible placement. PT  At this time is recommending home with HH and not SNF. CSW will make CM aware. Clinical Social Worker will sign off for now as social work intervention is no longer needed. Please consult us again if new need arises.   Jamesen Stahnke, MSW [phone] 

## 2013-06-11 NOTE — Op Note (Signed)
Tracy Dixon, Tracy Dixon               ACCOUNT NO.:  [id number]  MEDICAL RECORD NO.:  [id number]  LOCATION:  [id number]                        FACILITY:  [facility]  PHYSICIAN:  Alvy Beal, MD    DATE OF BIRTH:  [birth year]  DATE OF PROCEDURE:  06/10/2013 DATE OF DISCHARGE:                              OPERATIVE REPORT   ADDENDUM  It should be noted that there was an incorrect count.  There was 0.5 x 1 patty that was missing.  I did directly inspect the wound, did not find it.  We also took an x-ray, which was read by Dr. Mariam Dollar, the radiologist and was also found to be negative for the patty.  At this point, having directly inspected the wound and had the x-ray, I was very confident that the patty was not retained in the surgical site. Unfortunately it most likely was lost in the drapes.  The family was made aware.     Alvy Beal, MD     DDB/MEDQ  D:  06/10/2013  T:  06/11/2013  Job:  [id number]

## 2013-06-11 NOTE — Evaluation (Signed)
Occupational Therapy Evaluation Patient Details Name: Tracy Dixon MRN: [id number] DOB: 02-01-1941 Today's Date: 06/11/2013 Time: 4098-1191 OT Time Calculation (min): 44 min  OT Assessment / Plan / Recommendation History of present illness Recovering from PLIF, L 4-5.   Clinical Impression   Pt is moving well POD 1.  Needs a lot verbal cues to generalize back precautions during ADL.  Able to cross foot over opposite knee to perform LB ADL.  Needs to practice bed mobility, shower transfer and implementation of precautions in daily living.    OT Assessment  Patient needs continued OT Services    Follow Up Recommendations  No OT follow up    Barriers to Discharge      Equipment Recommendations  3 in 1 bedside comode    Recommendations for Other Services    Frequency  Min 2X/week    Precautions / Restrictions Precautions Precautions: Back Precaution Booklet Issued: Yes (comment) Precaution Comments: instructed in back precautions related to ADL Required Braces or Orthoses: Spinal Brace Spinal Brace: Lumbar corset;Applied in sitting position Restrictions Weight Bearing Restrictions: No   Pertinent Vitals/Pain No c/o pain    ADL  Eating/Feeding: Independent Where Assessed - Eating/Feeding: Edge of bed Grooming: Wash/dry hands;Wash/dry face;Teeth care;Supervision/safety Where Assessed - Grooming: Unsupported standing Upper Body Bathing: Modified independent Where Assessed - Upper Body Bathing: Unsupported standing Lower Body Bathing: Supervision/safety Where Assessed - Lower Body Bathing: Unsupported sitting;Supported sit to stand Upper Body Dressing: Set up Where Assessed - Upper Body Dressing: Unsupported sitting Lower Body Dressing: Supervision/safety Where Assessed - Lower Body Dressing: Unsupported sitting;Supported sit to stand Toilet Transfer: Supervision/safety Toilet Transfer Method: Sit to Barista: Comfort height toilet;Grab  bars Toileting - Architect and Hygiene: Modified independent Where Assessed - Engineer, mining and Hygiene: Sit to stand from 3-in-1 or toilet Equipment Used: Back brace Transfers/Ambulation Related to ADLs: ambulated with supervision and use of IV pole ADL Comments: Pt required multiple verbal cues to avoid breaking back precaution during bathing, dressing and grooming.    OT Diagnosis: Generalized weakness  OT Problem List: Impaired balance (sitting and/or standing);Decreased knowledge of precautions OT Treatment Interventions: Self-care/ADL training;Patient/family education   OT Goals(Current goals can be found in the care plan section) Acute Rehab OT Goals Patient Stated Goal: to go home  OT Goal Formulation: With patient Time For Goal Achievement: 06/18/13 Potential to Achieve Goals: Good ADL Goals Pt Will Perform Lower Body Bathing: with modified independence;sit to/from stand Pt Will Perform Lower Body Dressing: with modified independence;sit to/from stand Pt Will Transfer to Toilet: with modified independence;ambulating Pt Will Perform Tub/Shower Transfer: with modified independence;ambulating Additional ADL Goal #1: Pt will perform bed mobility modified independently adhering to back precautions.  Visit Information  Last OT Received On: 06/11/13 Assistance Needed: +1 History of Present Illness: Recovering from PLIF, L 4-5.       Prior Functioning     Home Living Family/patient expects to be discharged to:: Private residence Living Arrangements: Spouse/significant other Available Help at Discharge: Family;Available 24 hours/day Type of Home: House Home Access: Stairs to enter [employer] of Steps: 4-5 Entrance Stairs-Rails: Left Home Layout: Two level;Able to live on main level with bedroom/bathroom;Full bath on main level Alternate Level Stairs-Number of Steps:  (lives on first floor) Home Equipment: Walker - standard;Cane -  single point;Other (comment) Additional Comments: has walk in shower Prior Function Level of Independence: Independent Communication Communication: No difficulties Dominant Hand: Right         Vision/Perception  Cognition  Cognition Arousal/Alertness: Awake/alert Behavior During Therapy: WFL for tasks assessed/performed Overall Cognitive Status: Within Functional Limits for tasks assessed    Extremity/Trunk Assessment Upper Extremity Assessment Upper Extremity Assessment: Overall WFL for tasks assessed Lower Extremity Assessment Lower Extremity Assessment: Overall WFL for tasks assessed Cervical / Trunk Assessment Cervical / Trunk Assessment: Normal     Mobility Bed Mobility Bed Mobility: Sit to Sidelying Left Sit to Sidelying Left: 4: Min assist Details for Bed Mobility Assistance: instructed in log rolling, assisted for LEs Transfers Transfers: Sit to Stand;Stand to Sit Sit to Stand: 5: Supervision;With upper extremity assist;From bed;From chair/3-in-1 Stand to Sit: 5: Supervision;With upper extremity assist;To bed;To chair/3-in-1 Details for Transfer Assistance: min cues for hand placement and to adhere to back precautions     Exercise     Balance Balance Balance Assessed: Yes Static Standing Balance Static Standing - Balance Support: No upper extremity supported;During functional activity Static Standing - Level of Assistance: 5: Stand by assistance   End of Session OT - End of Session Activity Tolerance: Patient tolerated treatment well Patient left: in bed;with call bell/phone within reach;with family/visitor present  GO     Evern Bio 06/11/2013, 12:35 PM [phone]

## 2013-06-11 NOTE — Op Note (Signed)
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  OPERATIVE PROCEDURES: 1. L3-5 decompression. 2. Posterior lateral arthrodesis with local bone graft plus DBX at L4-     5. 3. Pedicle screw fixation (nonsegmental instrumentation) L4-5 with     Synthes Matrix pedicle screws.  COMPLICATIONS:  None.  CONDITION:  Stable.  HISTORY:  This is a very pleasant 72 year old female.  She has been having severe debilitating back, buttock, and bilateral leg pain.  The patient's quality of life continued to deteriorate despite appropriate conservative medical management.  As a result, we elected to proceed with surgery.  All appropriate risks, benefits, and alternatives were discussed and consent was obtained.  OPERATIVE NOTE:  The patient was brought to the operating room, placed supine on the operating table.  After successful induction of general anesthesia and endotracheal intubation, TEDs, SCDs, and Foley were inserted.  She was turned prone onto the [place] frame.  All bony prominences were well padded.  The back was prepped and draped in standard fashion.  After appropriate time-out confirming the patient, procedure, and all other pertinent important data was done, I infiltrated the incision site with 0.25% Marcaine and a central incision.  This has been from L3-S1. Sharp dissection was carried out down to the deep fascia.  Deep fascia was sharply incised.  I stripped the paraspinal muscles using electrocautery from the  spinous processes L3, L4, and L5.  I then identified the L4-5 and L5-S1 facet capsules and then identified the L4 transverse process and the L5 transverse process.  I continued my dissection until I had all of this exposed bilaterally.  Once the exposure was complete, I used bipolar electrocautery to obtain hemostasis.  I then used a high-speed bur to start my pedicle location. This was done on the junction of the midportion of the transverse process at the junction of the transverse process and facet complex.  I then advanced the pedicle probe through the pedicle and into the vertebral body.  I confirmed trajectory using biplanar fluoroscopy as well as direct visualization.  Once the pedicle probe was inserted at L4, I repeated the procedure at L5.  I then did this on the contralateral side.  Once I had all 4 pedicle probes in place, I took final AP and lateral views, which demonstrated satisfactory position of all 4 probes.  I then sequentially removed the probe, palpated with a ball-tip Feeler, and then tapped, and then repalpated with ball-tipped Feeler.  I then placed on the right side at 45 mm, 6-0 diameter screw at L4, and a 40 mm screw at L5.  Again at all times, there was no evidence of breach.  I then repeated the procedure on the contralateral side. Once I  had all 4 pedicle screws in place, I then proceeded with my decompression.  The posterior spinous process of L5 and L4 were removed in bulk with double-action Leksell rongeur.  I then used a small curette to develop a plane underneath the lamina of L5.  I then performed a complete laminectomy of L5 using a 2 and 3 mm Kerrison rongeur.  Once I had the central decompression complete, I then widened the decompression out laterally until I could identify the L5 pedicle.  Once I could identify the L5 pedicle in the lateral recess, I then went into the L5 foramen. I could now palpate the medial and inferior aspect of the L5  pedicle and there was no evidence of breach.  I did this also on the contralateral side.  Once I had this portion of the decompression complete, I then noted significant thickening of the ligamentum flavum, (buckling) as well as per spur formation in the lateral recess between L4 and L5.  I then worked in the lateral recess with a 2-mm Kerrison, gently using a Penfield 4 to create a plane and then using a 2-mm Kerrison to resect the osteophyte.  Once this was done, I had excellent visualization of the L4 pedicle.  I then identified the L4 nerve root and performed a generous foraminotomy using a 2 and 3 mm Kerrison rongeur.  At this point, I then palpated the medial and inferior aspect of the L4 pedicle, again confirming that there was no breach.  I then went superiorly until that was above the L4 pedicles and across the L3-4 disk space to complete decompression.  There was no evidence of any ongoing compression above the L3-4 disk space.  At this point, with central decompression complete and the lateral recess decompression complete, I ensured that I had an adequate foraminotomy of L4-5.  Once this was done, and I was satisfied with the decompression, I then irrigated the wound copiously with normal saline.  I obtained hemostasis using bipolar electrocautery.  I then decorticated the transverse process of L4 and L5 and removed the L4-5 facet capsule.  I then placed the bone that I had harvested from the decompression in the lateral gutter and placed DBX over it to keep it in place.  I then contoured the rods and secured them to the pedicle screws and tightened each one down according manufacturer's standard.  At this point, with the screw-rod complex complete, and the decompression adequate, I then irrigated copiously with normal saline.  I then placed a thrombin-soaked Gelfoam patty over the exposed thecal sac.  I then placed vancomycin impregnated beads into the wound.  I then closed  the deep fascia with interrupted #1 Vicryl suture, and then superficial with 2-0 Vicryl sutures and 3-0 Monocryl for the skin.  Steri-Strips and dry dressing were applied.  The patient was ultimately extubated and transferred to the PACU without incident. At the end of the case, all needle and sponge counts were correct. There was no adverse intraoperative events.     Alvy Beal, MD     DDB/MEDQ  D:  06/10/2013  T:  06/11/2013  Job:  [id number]

## 2013-06-11 NOTE — Progress Notes (Signed)
    Subjective: Procedure(s) (LRB): POSTERIOR SPINAL FUSION L4-L5   1 LEVEL (N/A) DECOMPRESSIVE LUMBAR LAMINECTOMY LEVEL 3 1 Day Post-Op  Patient reports pain as 3 on 0-10 scale.  Reports decreased leg pain reports incisional back pain   Positive void Negative bowel movement Positive flatus Negative chest pain or shortness of breath  Objective: Vital signs in last 24 hours: Temp:  [97.3 F (36.3 C)-98.4 F (36.9 C)] 98.4 F (36.9 C) (07/17 0500) Pulse Rate:  [69-99] 95 (07/17 0500) Resp:  [9-20] 20 (07/17 0500) BP: (89-144)/(30-65) 112/49 mmHg (07/17 0500) SpO2:  [90 %-100 %] 95 % (07/17 0500)  Intake/Output from previous day: 07/16 0701 - 07/17 0700 In: 3000 [I.V.:3000] Out: 1050 [Urine:700; Blood:350]  Labs: No results found for this basename: WBC, RBC, HCT, PLT,  in the last 72 hours No results found for this basename: NA, K, CL, CO2, BUN, CREATININE, GLUCOSE, CALCIUM,  in the last 72 hours No results found for this basename: LABPT, INR,  in the last 72 hours  Physical Exam: Neurologically intact ABD soft Neurovascular intact Incision: dressing C/D/I and no drainage Compartment soft  Assessment/Plan: Patient stable  xrays pending Continue mobilization with physical therapy Continue care  Advance diet Up with therapy D/C IV fluids Mobilization today    Venita Lick, MD [facility] [phone]

## 2013-06-11 NOTE — Evaluation (Signed)
Physical Therapy Evaluation Patient Details Name: Tracy Dixon MRN: [id number] DOB: [birth year] Today's Date: 06/11/2013 Time: 4098-1191 PT Time Calculation (min): 26 min  PT Assessment / Plan / Recommendation History of Present Illness   s/p lumbar fusion L4-L5  Clinical Impression  Patient is s/p lumbar fusion L4-L5 surgery resulting in functional limitations due to the deficits listed below (see PT Problem List). Pt moving well with min balance deficits with dynamic gt; would benefit from RW to increase stability during gt. Patient will benefit from skilled PT to increase their independence and safety with mobility to allow discharge to the venue listed below. Will plan to address stairs during next session and practice log rolling technique.      PT Assessment  Patient needs continued PT services    Follow Up Recommendations  Home health PT;Supervision - Intermittent;Supervision for mobility/OOB    Does the patient have the potential to tolerate intense rehabilitation      Barriers to Discharge   none    Equipment Recommendations  Rolling walker with 5" wheels;3in1 (PT)    Recommendations for Other Services     Frequency Min 5X/week    Precautions / Restrictions Precautions Precautions: Back Precaution Booklet Issued: Yes (comment) Restrictions Weight Bearing Restrictions: No   Pertinent Vitals/Pain 5/10       Mobility  Bed Mobility Bed Mobility: Not assessed Details for Bed Mobility Assistance: discussed log rolling technique; will address tomorrow  Transfers Transfers: Sit to Stand;Stand to Sit Sit to Stand: 4: Min guard;From chair/3-in-1;With armrests Stand to Sit: 4: Min guard;To chair/3-in-1;With armrests Details for Transfer Assistance: min cues for hand placement and to adhere to back precautions Ambulation/Gait Ambulation/Gait Assistance: 4: Min guard Ambulation Distance (Feet): 150 Feet Assistive device: 1 person hand held assist;Other (Comment) (IV  pole) Ambulation/Gait Assistance Details: pt progressed to amb with IV pole only; would benefit from RW to increase stability and safety with amb; pt agreeable; pt encouraged to increase BOS for stability  Gait Pattern: Narrow base of support Gait velocity: decreased Stairs: No Wheelchair Mobility Wheelchair Mobility: No    Exercises     PT Diagnosis: Difficulty walking;Acute pain  PT Problem List: Decreased mobility;Decreased balance;Decreased knowledge of use of DME;Pain PT Treatment Interventions: DME instruction;Gait training;Stair training;Functional mobility training;Therapeutic activities;Balance training;Neuromuscular re-education;Patient/family education     PT Goals(Current goals can be found in the care plan section) Acute Rehab PT Goals Patient Stated Goal: to go home  PT Goal Formulation: With patient Time For Goal Achievement: 06/14/13 Potential to Achieve Goals: Good  Visit Information  Last PT Received On: 06/11/13 Assistance Needed: +1       Prior Functioning  Home Living Family/patient expects to be discharged to:: Private residence Living Arrangements: Spouse/significant other Available Help at Discharge: Family;Available 24 hours/day Type of Home: House Home Access: Stairs to enter [employer] of Steps: 4-5 Entrance Stairs-Rails: Left (has wall she can reach on Right) Home Layout: Two level;Able to live on main level with bedroom/bathroom;Full bath on main level Alternate Level Stairs-Number of Steps:  (lives on first floor) Home Equipment: Walker - standard;Cane - single point;Other (comment) (has handicap height toilet seat) Additional Comments: has walk in shower Prior Function Level of Independence: Independent Communication Communication: No difficulties Dominant Hand: Right    Cognition  Cognition Arousal/Alertness: Awake/alert Behavior During Therapy: WFL for tasks assessed/performed Overall Cognitive Status: Within Functional  Limits for tasks assessed    Extremity/Trunk Assessment Upper Extremity Assessment Upper Extremity Assessment: Defer to OT evaluation Lower Extremity  Assessment Lower Extremity Assessment: Overall WFL for tasks assessed Cervical / Trunk Assessment Cervical / Trunk Assessment: Normal   Balance Balance Balance Assessed: Yes Static Standing Balance Static Standing - Balance Support: No upper extremity supported;During functional activity Static Standing - Level of Assistance: 5: Stand by assistance  End of Session PT - End of Session Equipment Utilized During Treatment: Gait belt;Back brace Activity Tolerance: Patient tolerated treatment well Patient left: Other (comment) (in bathroom with OT) Nurse Communication: Mobility status  GP     Donell Sievert, [place] [phone] 06/11/2013, 12:10 PM

## 2013-06-11 NOTE — Progress Notes (Signed)
UR COMPLETED  

## 2013-06-12 ENCOUNTER — Encounter (HOSPITAL_COMMUNITY): Payer: Self-pay | Admitting: Orthopedic Surgery

## 2013-06-12 MED ORDER — ONDANSETRON HCL 4 MG PO TABS: 4.0000 mg | ORAL_TABLET | Freq: Three times a day (TID) | ORAL | Status: DC | PRN

## 2013-06-12 MED ORDER — FLEET ENEMA 7-19 GM/118ML RE ENEM
1.0000 | ENEMA | Freq: Once | RECTAL | Status: AC
  Administered 2013-06-12: 1 via RECTAL
  Filled 2013-06-12: qty 1

## 2013-06-12 MED ORDER — POLYETHYLENE GLYCOL 3350 17 GM/SCOOP PO POWD: 17.0000 g | Freq: Every day | ORAL | Status: DC

## 2013-06-12 MED ORDER — DOCUSATE SODIUM 100 MG PO CAPS: 100.0000 mg | ORAL_CAPSULE | Freq: Three times a day (TID) | ORAL | Status: DC | PRN

## 2013-06-12 MED ORDER — OXYCODONE-ACETAMINOPHEN 10-325 MG PO TABS: 1.0000 | ORAL_TABLET | ORAL | Status: DC | PRN

## 2013-06-12 MED ORDER — METHOCARBAMOL 500 MG PO TABS: 500.0000 mg | ORAL_TABLET | Freq: Three times a day (TID) | ORAL | Status: DC | PRN

## 2013-06-12 NOTE — Progress Notes (Signed)
Occupational Therapy Treatment Patient Details Name: Tracy Dixon MRN: [id number] DOB: 1941-09-17 Today's Date: 06/12/2013 Time: 4098-1191 OT Time Calculation (min): 13 min  OT Assessment / Plan / Recommendation  OT comments  Pt c/o pain today. Educated pt on importance of following back precautions. Pt is not able to cross legs to donn panties/ socks. Etc, therefore nec for her to learn how to use AE. Husband present. Will see in am to reinforce use of AE in order to adhere to back precautions. Pt should be ready for D/C after therapy tomorrow.  Follow Up Recommendations  No OT follow up    Barriers to Discharge       Equipment Recommendations  3 in 1 bedside comode    Recommendations for Other Services    Frequency Min 2X/week   Progress towards OT Goals Progress towards OT goals: Progressing toward goals  Plan Discharge plan remains appropriate    Precautions / Restrictions Precautions Precautions: Back Precaution Comments: reviewed back precautions; pt able to indepdently recall 3/3  Required Braces or Orthoses: Spinal Brace Spinal Brace: Lumbar corset;Applied in sitting position   Pertinent Vitals/Pain 5. back    ADL  ADL Comments: Pt able to state bacck precautions but does not appear to be adhering to them Pt staes that she put on her panties and bathed yesterday without use of AE. discussed importance of following  back precautions    OT Diagnosis:    OT Problem List:   OT Treatment Interventions:     OT Goals(current goals can now be found in the care plan section) Acute Rehab OT Goals Patient Stated Goal: home first thing in the morning  OT Goal Formulation: With patient Time For Goal Achievement: 06/18/13 Potential to Achieve Goals: Good ADL Goals Pt Will Perform Lower Body Bathing: with modified independence;sit to/from stand Pt Will Perform Lower Body Dressing: with modified independence;sit to/from stand Pt Will Transfer to Toilet: with modified  independence;ambulating Pt Will Perform Tub/Shower Transfer: with modified independence;ambulating Additional ADL Goal #1: Pt will perform bed mobility modified independently adhering to back precautions.  Visit Information  Last OT Received On: 06/12/13 Assistance Needed: +1 History of Present Illness: Recovering from PLIF, L 4-5.    Subjective Data      Prior Functioning       Cognition  Cognition Arousal/Alertness: Awake/alert Behavior During Therapy: WFL for tasks assessed/performed    Mobility       Exercises      Balance     End of Session OT - End of Session Activity Tolerance: Patient tolerated treatment well Patient left: in bed;with call bell/phone within reach;with family/visitor present Nurse Communication: Mobility status  GO     Otha Monical,HILLARY 06/12/2013, 3:48 PM [facility], OTR/L  [phone] 06/12/2013

## 2013-06-12 NOTE — Progress Notes (Signed)
    Subjective: Procedure(s) (LRB): POSTERIOR SPINAL FUSION L4-L5   1 LEVEL (N/A) DECOMPRESSIVE LUMBAR LAMINECTOMY LEVEL 3 2 Days Post-Op  Patient reports pain as 3 on 0-10 scale.  Reports decreased leg pain reports incisional back pain   Positive void Negative bowel movement Positive flatus Negative chest pain or shortness of breath  Objective: Vital signs in last 24 hours: Temp:  [98.3 F (36.8 C)-98.5 F (36.9 C)] 98.5 F (36.9 C) (07/18 0500) Pulse Rate:  [77-88] 77 (07/18 0500) Resp:  [18] 18 (07/18 0500) BP: (117-140)/(47-56) 140/49 mmHg (07/18 0500) SpO2:  [96 %-98 %] 97 % (07/18 0500) Weight:  [72.122 kg (159 lb)] 72.122 kg (159 lb) (07/17 2045)  Intake/Output from previous day: 07/17 0701 - 07/18 0700 In: 1732.9 [P.O.:720; I.V.:1012.9] Out: -   Labs: No results found for this basename: WBC, RBC, HCT, PLT,  in the last 72 hours No results found for this basename: NA, K, CL, CO2, BUN, CREATININE, GLUCOSE, CALCIUM,  in the last 72 hours No results found for this basename: LABPT, INR,  in the last 72 hours  Physical Exam: Neurologically intact ABD soft Intact pulses distally Dorsiflexion/Plantar flexion intact Incision: dressing C/D/I and no drainage Compartment soft  Assessment/Plan: Patient stable  xrays satisfactory Continue mobilization with physical therapy Continue care  Advance diet Up with therapy Discharge home with home health  Venita Lick, MD [facility] [phone]

## 2013-06-12 NOTE — Care Management Note (Unsigned)
    Page 1 of 2   06/12/2013     11:19:39 AM   CARE MANAGEMENT NOTE 06/12/2013  Patient:  Tracy Dixon, Tracy Dixon   Account Number:  [id number]  Date Initiated:  06/12/2013  Documentation initiated by:  Isidoro Donning  Subjective/Objective Assessment:   POSTERIOR SPINAL FUSION L4-L5   1 LEVEL (N/A)  DECOMPRESSIVE LUMBAR LAMINECTOMY LEVEL     Action/Plan:   HH PT, DME recommended   Anticipated DC Date:  06/13/2013   Anticipated DC Plan:  HOME W HOME HEALTH SERVICES      DC Planning Services  CM consult      Choice offered to / List presented to:  C-1 Patient   DME arranged  3-N-1  Levan Hurst      DME agency  [facility]     HH arranged  HH-2 PT      [facility] agency  [facility]   Status of service:  In process, will continue to follow Medicare Important Message given?   (If response is "NO", the following Medicare IM given date fields will be blank) Date Medicare IM given:   Date Additional Medicare IM given:    Discharge Disposition:    Per UR Regulation:    If discussed at Long Length of Stay Meetings, dates discussed:    Comments:  06/12/13 Spoke with patient about HHC. She chose [facility]. Contacted Peni Rupard at [facility] and set up HHPT. Contacted Darian with [facility] and requested rolling walker and 3N1. Patient's husband to assist after d/c. Jacquelynn Cree RN, BSN, CCM

## 2013-06-12 NOTE — Progress Notes (Signed)
Physical Therapy Treatment Patient Details Name: Tracy Dixon MRN: [id number] DOB: 1941/11/08 Today's Date: 06/12/2013 Time: 8469-6295 PT Time Calculation (min): 24 min  PT Assessment / Plan / Recommendation  PT Comments   Pt unable to increase amb today due to pain and soreness. Pt reported she had a rough night and did not sleep well. Anticipates D/C home in the morning. Next session will need to address stairs to ensure safe D/C home with husband.   Follow Up Recommendations  Home health PT;Supervision - Intermittent;Supervision for mobility/OOB     Does the patient have the potential to tolerate intense rehabilitation     Barriers to Discharge        Equipment Recommendations  Rolling walker with 5" wheels;3in1 (PT)    Recommendations for Other Services    Frequency Min 5X/week   Progress towards PT Goals Progress towards PT goals: Progressing toward goals  Plan Current plan remains appropriate    Precautions / Restrictions Precautions Precautions: Back Precaution Comments: reviewed back precautions; pt able to indepdently recall 3/3  Required Braces or Orthoses: Spinal Brace Spinal Brace: Lumbar corset;Applied in sitting position Restrictions Weight Bearing Restrictions: No   Pertinent Vitals/Pain BP sitting in chair after activity 140/55. While sitting states "i wouldn't even give my pain a number"     Mobility  Bed Mobility Bed Mobility: Rolling Left;Left Sidelying to Sit Rolling Left: 4: Min assist Left Sidelying to Sit: 4: Min assist;HOB flat Details for Bed Mobility Assistance: instructed log rolling technique; gave husband of pt instructions for (A) at home due to lack of hand rails; pt and husband able to demo good technique after cueing; requires increased time due to pain  Transfers Transfers: Sit to Stand;Stand to Sit Sit to Stand: 5: Supervision;From bed Stand to Sit: 5: Supervision;To chair/3-in-1;With armrests Details for Transfer Assistance: min cues  for hand placement and to adhere to back precautions; pt requires 3 in 1 for toilet transfer for arm rests in order to adhere to back precautions Ambulation/Gait Ambulation/Gait Assistance: 5: Supervision Ambulation Distance (Feet): 30 Feet Assistive device: Rolling walker Ambulation/Gait Assistance Details: RW increased stability and safety with gt; pt required min cues for safety and to not turn trunk when manuvering around turns; pt demo increased stability and balance with RW and is agreeable to amb with when returing home  Gait Pattern: Narrow base of support;Decreased stride length Gait velocity: decreased General Gait Details: c/o pain in R LE with amb  Stairs: No Wheelchair Mobility Wheelchair Mobility: No    Exercises     PT Diagnosis:    PT Problem List:   PT Treatment Interventions:     PT Goals (current goals can now be found in the care plan section) Acute Rehab PT Goals Patient Stated Goal: home first thing in the morning  PT Goal Formulation: With patient Time For Goal Achievement: 06/14/13 Potential to Achieve Goals: Good  Visit Information  Last PT Received On: 06/12/13 Assistance Needed: +1 History of Present Illness: Recovering from PLIF, L 4-5.    Subjective Data  Subjective: "the doctor said i walked too much yesterday. im really hurting today. can we just go to the bathroom and do a little walk?" Patient Stated Goal: home first thing in the morning    Cognition  Cognition Arousal/Alertness: Awake/alert Behavior During Therapy: WFL for tasks assessed/performed Overall Cognitive Status: Within Functional Limits for tasks assessed    Balance  Balance Balance Assessed: Yes Static Standing Balance Static Standing - Balance Support: No  upper extremity supported;During functional activity Static Standing - Level of Assistance: 5: Stand by assistance  End of Session PT - End of Session Equipment Utilized During Treatment: Gait belt;Back brace Activity  Tolerance: Patient tolerated treatment well Patient left: in chair;with call bell/phone within reach;with family/visitor present Nurse Communication: Mobility status   GP     Donell Sievert, [place] [phone] 06/12/2013, 9:18 AM

## 2013-06-12 NOTE — Discharge Summary (Signed)
Patient ID: Tracy Dixon MRN: [id number] DOB/AGE: 12/21/1940 72 y.o.  Admit date: 06/10/2013 Discharge date: 06/12/2013  Admission Diagnoses:  Active Problems:   * No active hospital problems. *   Discharge Diagnoses:  Active Problems:   * No active hospital problems. *  status post Procedure(s): POSTERIOR SPINAL FUSION L4-L5   1 LEVEL DECOMPRESSIVE LUMBAR LAMINECTOMY LEVEL 3  Past Medical History  Diagnosis Date  . Allergy   . Asthma   . Hyperlipidemia   . Hypertension   . Arthritis   . Diverticulosis of colon   . Osteoarthritis   . Gallstones   . Acoustic neuroma     followed by [facility], dx in [place]  . Complication of anesthesia     stays sleepy    Surgeries: Procedure(s): POSTERIOR SPINAL FUSION L4-L5   1 LEVEL DECOMPRESSIVE LUMBAR LAMINECTOMY LEVEL 3 on 06/10/2013   Consultants:  none  Discharged Condition: Improved  Hospital Course: Tracy Dixon is an 72 y.o. female who was admitted 06/10/2013 for operative treatment of back and leg pain. Patient failed conservative treatments (please see the history and physical for the specifics) and had severe unremitting pain that affects sleep, daily activities and work/hobbies. After pre-op clearance, the patient was taken to the operating room on 06/10/2013 and underwent  Procedure(s): POSTERIOR SPINAL FUSION L4-L5   1 LEVEL DECOMPRESSIVE LUMBAR LAMINECTOMY LEVEL 3.    Patient was given perioperative antibiotics: Anti-infectives   Start     Dose/Rate Route Frequency Ordered Stop   06/10/13 1800  ceFAZolin (ANCEF) IVPB 1 g/50 mL premix     1 g 100 mL/hr over 30 Minutes Intravenous Every 8 hours 06/10/13 1756 06/11/13 0352   06/10/13 1453  vancomycin (VANCOCIN) powder  Status:  Discontinued       As needed 06/10/13 1454 06/10/13 1646   06/09/13 1427  ceFAZolin (ANCEF) IVPB 2 g/50 mL premix     2 g 100 mL/hr over 30 Minutes Intravenous 30 min pre-op 06/09/13 1427 06/10/13 1212       Patient was given  sequential compression devices and early ambulation to prevent DVT.   Patient benefited maximally from hospital stay and there were no complications. At the time of discharge, the patient was urinating/moving their bowels without difficulty, tolerating a regular diet, pain is controlled with oral pain medications and they have been cleared by PT/OT.   Recent vital signs: Patient Vitals for the past 24 hrs:  BP Temp Temp src Pulse Resp SpO2 Height Weight  06/12/13 0500 140/49 mmHg 98.5 F (36.9 C) Oral 77 18 97 % - -  06/11/13 2050 117/47 mmHg 98.3 F (36.8 C) Oral 88 18 96 % - -  06/11/13 2045 - - - - - - 5\' 3"  (1.6 m) 72.122 kg (159 lb)  06/11/13 1459 120/56 mmHg 98.5 F (36.9 C) - 82 18 98 % - -     Recent laboratory studies: No results found for this basename: WBC, HGB, HCT, PLT, NA, K, CL, CO2, BUN, CREATININE, GLUCOSE, PT, INR, CALCIUM, 2,  in the last 72 hours   Discharge Medications:     Medication List         albuterol 108 (90 BASE) MCG/ACT inhaler  Commonly known as:  PROVENTIL HFA;VENTOLIN HFA  Inhale 1-2 puffs into the lungs every 6 (six) hours as needed for shortness of breath (for use when in contact with dogs or cats.).     amLODipine 10 MG tablet  Commonly known  as:  NORVASC  Take 10 mg by mouth daily.     aspirin 81 MG tablet  Take 81 mg by mouth daily.     cholecalciferol 1000 UNITS tablet  Commonly known as:  VITAMIN D  Take 1,000 Units by mouth daily.     docusate sodium 100 MG capsule  Commonly known as:  COLACE  Take 1 capsule (100 mg total) by mouth 3 (three) times daily as needed for constipation.     hydrochlorothiazide 25 MG tablet  Commonly known as:  HYDRODIURIL  Take 25 mg by mouth daily.     methocarbamol 500 MG tablet  Commonly known as:  ROBAXIN  Take 1 tablet (500 mg total) by mouth 3 (three) times daily as needed.     ondansetron 4 MG tablet  Commonly known as:  ZOFRAN  Take 1 tablet (4 mg total) by mouth every 8 (eight) hours as  needed for nausea.     oxyCODONE-acetaminophen 10-325 MG per tablet  Commonly known as:  PERCOCET  Take 1 tablet by mouth every 4 (four) hours as needed for pain.     polyethylene glycol powder powder  Commonly known as:  GLYCOLAX  Take 17 g by mouth daily.        Diagnostic Studies: Dg Chest 2 View  06/02/2013   *RADIOLOGY REPORT*  Clinical Data: Preoperative evaluation for lumbar fusion, history hypertension, asthma, former smoker  CHEST - 2 VIEW  Comparison: None  Findings: Normal heart size, mediastinal contours, and pulmonary vascularity. Peribronchial thickening. No pulmonary infiltrate, pleural effusion or pneumothorax. Bones appear diffusely demineralized with multilevel scattered mild endplate spur formation of the thoracic spine peri  IMPRESSION: Bronchitic changes.   Original Report Authenticated By: Ulyses Southward, M.D.   Dg Lumbar Spine 2-3 Views  06/11/2013   *RADIOLOGY REPORT*  Clinical Data: Postop radiograph, status post spinal fusion at L4- L5.  LUMBAR SPINE - 2-3 VIEW  Comparison: Lumbar spine radiographs performed earlier today at 04:01 p.m.  Findings: The patient is status post posterior lumbar spinal fusion at L4-L5.  Mild grade 1 anterolisthesis of L4-L5 is less prominent than on prior studies.  Overlying radiopaque beads reflect antibiotic beads, per correlation with operative report. Vacuum phenomenon is noted at L4-L5.  Mild degenerative change is seen at L5-S1, and along the lower thoracic spine.  The visualized bowel gas pattern is grossly unremarkable.  IMPRESSION: Status post posterior lumbar spinal fusion at L4-L5.   Original Report Authenticated By: Tonia Ghent, M.D.   Dg Lumbar Spine 2-3 Views  06/10/2013   *RADIOLOGY REPORT*  Clinical Data: Back pain.  DG C-ARM 61-120 MIN,LUMBAR SPINE - 2-3 VIEW  Technique:  C-arm fluoroscopic images were obtained intraoperatively and submitted for postoperative interpretation. Please see the performing provider's procedural report  for the fluoroscopy time utilized.  Comparison: Plain films 06/02/2013.  Findings: L4-5 bilateral pedicle screw and rod construct.  Mild anterolisthesis.  Antibiotic pellets are seen in the wound.  IMPRESSION: L4-5 fusion as described.   Original Report Authenticated By: Davonna Belling, M.D.   Dg Lumbar Spine 2-3 Views  06/02/2013   *RADIOLOGY REPORT*  Clinical Data: Preoperative evaluation for lumbar spine surgery  LUMBAR SPINE - 2-3 VIEW  Comparison: 06/11/2012  Findings: Osseous demineralization. Five non-rib bearing lumbar vertebrae. Multilevel disc space narrowing and endplate spur formation. Grade 1 anterolisthesis at L4-L5 secondary to facet degenerative changes. Mild biconvex thoracolumbar scoliosis. No acute fracture, additional subluxation or bone destruction. Facet degenerative changes at multiple levels in the  lower lumbar spine. SI joints symmetric.  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   Original Report Authenticated By: Ulyses Southward, M.D.   Dg Spine Portable 1 View  06/10/2013   *RADIOLOGY REPORT*  Clinical Data: Incorrect patchy count.  PORTABLE SPINE - 1 VIEW  Comparison: Lumbar spine radiographs 06/02/2013.  Findings: L4-L5 fusion construct.  No definite radiopaque foreign body is observed.  IMPRESSION: No visible pattie is seen in the wound or surrounding soft tissues.   Original Report Authenticated By: Davonna Belling, M.D.   Dg C-arm [phone] Min  06/10/2013   *RADIOLOGY REPORT*  Clinical Data: Back pain.  DG C-ARM 61-120 MIN,LUMBAR SPINE - 2-3 VIEW  Technique:  C-arm fluoroscopic images were obtained intraoperatively and submitted for postoperative interpretation. Please see the performing provider's procedural report for the fluoroscopy time utilized.  Comparison: Plain films 06/02/2013.  Findings: L4-5 bilateral pedicle screw and rod construct.  Mild  anterolisthesis.  Antibiotic pellets are seen in the wound.  IMPRESSION: L4-5 fusion as described.   Original Report Authenticated By: Davonna Belling, M.D.          Follow-up Information   Follow up with Alvy Beal, MD. Schedule an appointment as soon as possible for a visit in 2 weeks.   Contact information:   [address] [place] [address] [phone]       Discharge Plan:  discharge to home  Disposition: stable    Signed: Venita Lick D for Dr. Venita Lick [facility] [phone] 06/12/2013, 7:57 AM

## 2013-06-13 NOTE — Progress Notes (Addendum)
Occupational Therapy Treatment Patient Details Name: Tracy Dixon MRN: [id number] DOB: [birth year] Today's Date: 06/13/2013 Time: 4098-1191 OT Time Calculation (min): 58 min  OT Assessment / Plan / Recommendation  OT comments  Practiced with AE for LB ADLs, shower transfer, toileting, and grooming at sink. Also, reviewed and practiced bed mobility. Educated pt and husband and answered any questions they had.   Follow Up Recommendations  No OT follow up    Barriers to Discharge       Equipment Recommendations  3 in 1 bedside comode    Recommendations for Other Services    Frequency Min 2X/week   Progress towards OT Goals Progress towards OT goals: Progressing toward goals  Plan Discharge plan remains appropriate    Precautions / Restrictions Precautions Precautions: Back Precaution Comments: reviewed back precautions; pt able to indepdently recall 3/3  Required Braces or Orthoses: Spinal Brace Spinal Brace: Lumbar corset;Applied in sitting position Restrictions Weight Bearing Restrictions: No   Pertinent Vitals/Pain Minimal pain, but did not state number. Premedicated.     ADL  Grooming: Performed;Wash/dry hands;Supervision/safety Where Assessed - Grooming: Unsupported standing Lower Body Bathing: Simulated;Set up;Supervision/safety Where Assessed - Lower Body Bathing: Unsupported sitting Upper Body Dressing: Set up;Supervision/safety Where Assessed - Upper Body Dressing: Unsupported sitting Lower Body Dressing: Performed;Supervision/safety;Set up Where Assessed - Lower Body Dressing: Supported sit to stand Toilet Transfer: Supervision/safety Statistician Method: Sit to Barista: Raised toilet seat with arms (or 3-in-1 over toilet) Toileting - Clothing Manipulation and Hygiene: Supervision/safety Where Assessed - Toileting Clothing Manipulation and Hygiene: Sit to stand from 3-in-1 or toilet Tub/Shower Transfer: Simulated;Minimal  assistance Tub/Shower Transfer Method: Science writer: Walk in shower;Other (comment) (3 in 1) Equipment Used: Gait belt;Rolling walker;Back brace;Reacher;Long-handled sponge;Long-handled shoe horn;Sock aid Transfers/Ambulation Related to ADLs: Minguard/supervision level for ambulation and transfers.  ADL Comments: Pt practiced with reacher, sockaid, and long handled sponge. Cues to maintain precautions. Also educated to have someone with her 24/7. Also discussed and recommended having bed or chair behind her with walker in front when pulling up pants/underwear and also to have husband with her. Practiced shower transfer and pt at [facility] A level for technique and maneuvering walker. Educated on toilet aid to assist with hygiene. Pt also practiced bed mobility. Educated husband on how to assist.     OT Diagnosis:    OT Problem List:   OT Treatment Interventions:     OT Goals(current goals can now be found in the care plan section) Acute Rehab OT Goals Patient Stated Goal: to go home OT Goal Formulation: With patient Time For Goal Achievement: 06/18/13 Potential to Achieve Goals: Good ADL Goals Pt Will Perform Lower Body Bathing: with modified independence;sit to/from stand Pt Will Perform Lower Body Dressing: with modified independence;sit to/from stand Pt Will Transfer to Toilet: with modified independence;ambulating Pt Will Perform Tub/Shower Transfer: with modified independence;ambulating Additional ADL Goal #1: Pt will perform bed mobility modified independently adhering to back precautions.  Visit Information  Last OT Received On: 06/13/13 Assistance Needed: +1 History of Present Illness: Recovering from PLIF, L 4-5.    Subjective Data      Prior Functioning       Cognition  Cognition Arousal/Alertness: Awake/alert Behavior During Therapy: WFL for tasks assessed/performed Overall Cognitive Status: Within Functional Limits for tasks assessed     Mobility  Bed Mobility Bed Mobility: Rolling Left;Right Sidelying to Sit;Sitting - Scoot to [place] of Bed;Sit to Sidelying Left;Scooting to [facility] Rolling Left: 4: Min assist  Left Sidelying to Sit: 4: Min assist Sit to Sidelying Left: 4: Min assist Scooting to [facility]: 2: Max assist Details for Bed Mobility Assistance: A for LE's. Educated to be sure to sit towards head of bed before going to sidelying positiong to prevent having to scoot HOB. Transfers Transfers: Sit to Stand;Stand to Sit Sit to Stand: 4: Min guard;5: Supervision;With upper extremity assist;From chair/3-in-1 Stand to Sit: 5: Supervision;With upper extremity assist;To chair/3-in-1;To bed Details for Transfer Assistance: Minguard from recliner chair, but sueprvision level from 3 in1.     Exercises      Balance     End of Session OT - End of Session Equipment Utilized During Treatment: Gait belt;Rolling walker;Back brace Activity Tolerance: Patient tolerated treatment well Patient left: in bed;with call bell/phone within reach;with family/visitor present  GO     Earlie Raveling OTR/L [phone] 06/13/2013, 11:18 AM

## 2013-06-13 NOTE — Progress Notes (Signed)
Orthopedics Progress Note  Subjective: I feel a lot better today.  Objective:  Filed Vitals:   06/13/13 0552  BP: 122/69  Pulse: 95  Temp: 98.7 F (37.1 C)  Resp: 18    General: Awake and alert  Musculoskeletal: lumbar wound clean and dry and intact, dressing changed, no erythema, no drainage Neurovascularly intact  Lab Results  Component Value Date   WBC 5.2 06/02/2013   HGB 14.0 06/02/2013   HCT 41.5 06/02/2013   MCV 77.4* 06/02/2013   PLT 209 06/02/2013       Component Value Date/Time   NA 137 06/02/2013 0952   K 3.7 06/02/2013 0952   CL 99 06/02/2013 0952   CO2 26 06/02/2013 0952   GLUCOSE 87 06/02/2013 0952   BUN 17 06/02/2013 0952   CREATININE 0.65 06/02/2013 0952   CALCIUM 9.7 06/02/2013 0952   GFRNONAA 87* 06/02/2013 0952   GFRAA >90 06/02/2013 0952    Lab Results  Component Value Date   INR 1.1 ratio* 03/30/2009    Assessment/Plan: POD #3 s/p Procedure(s): POSTERIOR SPINAL FUSION L4-L5   1 LEVEL DECOMPRESSIVE LUMBAR LAMINECTOMY LEVEL 3 Patient doing very well, stable for D/C  Viviann Spare R. Ranell Patrick, MD 06/13/2013 12:06 PM

## 2013-06-13 NOTE — Progress Notes (Signed)
Physical Therapy Treatment Patient Details Name: Tracy Dixon MRN: [id number] DOB: Mar 14, 1941 Today's Date: 06/13/2013 Time: 1000-1025 PT Time Calculation (min): 25 min  PT Assessment / Plan / Recommendation  PT Comments   Pt making great progress toward her goals with increased mobility today. Pt able to incr ambulation distance and completed stair education.  Follow Up Recommendations  Home health PT;Supervision - Intermittent;Supervision for mobility/OOB           Equipment Recommendations  Rolling walker with 5" wheels;3in1 (PT)       Frequency Min 5X/week   Progress towards PT Goals Progress towards PT goals: Progressing toward goals  Plan Current plan remains appropriate    Precautions / Restrictions Precautions Precautions: Back Precaution Booklet Issued: Yes (comment) Precaution Comments: reviewed back precautions; pt able to indepdently recall 3/3  Required Braces or Orthoses: Spinal Brace Spinal Brace: Lumbar corset;Applied in sitting position (pt independent with brace management) Restrictions Weight Bearing Restrictions: No       Mobility  Bed Mobility Bed Mobility: Right Sidelying to Sit;Sit to Sidelying Right Rolling Right: 4: Min guard Rolling Left: 4: Min assist Right Sidelying to Sit: 4: Min guard;HOB flat Left Sidelying to Sit: 4: Min assist Sit to Sidelying Right: 4: Min assist;HOB flat Sit to Sidelying Left: 4: Min assist Scooting to [facility]: 2: Max assist Details for Bed Mobility Assistance: min cues for technique and min assist to clear bed surface with legs when lying back down. Repeated x2 for education and incr practice. Changed to exiting bed on right as this is the side the pt uses at home. Transfers Sit to Stand: 5: Supervision;From bed;With upper extremity assist Stand to Sit: 5: Supervision;To bed;With upper extremity assist Details for Transfer Assistance: no cues or assist needed Ambulation/Gait Ambulation/Gait Assistance: 5:  Supervision;6: Modified independent (Device/Increase time) Ambulation Distance (Feet): 200 Feet Assistive device: Rolling walker Ambulation/Gait Assistance Details: Occasional cues for walker position intitially, progressed to mod I with RW and no cues or assistance needed. Gait Pattern: Step-through pattern;Decreased stride length;Decreased step length - right;Decreased step length - left Gait velocity: decreased Stairs: Yes Stairs Assistance: 4: Min assist Stairs Assistance Details (indicate cue type and reason): min HHA on right with ascending and on left with descending stairs. cues for sequence and technique. Stair Management Technique: One rail Left;Forwards Number of Stairs: 5         PT Goals (current goals can now be found in the care plan section) Acute Rehab PT Goals Patient Stated Goal: to go home PT Goal Formulation: With patient Time For Goal Achievement: 06/14/13 Potential to Achieve Goals: Good  Visit Information  Last PT Received On: 06/13/13 Assistance Needed: +1 History of Present Illness: Recovering from PLIF, L 4-5.    Subjective Data  Patient Stated Goal: to go home   Cognition  Cognition Arousal/Alertness: Awake/alert Behavior During Therapy: WFL for tasks assessed/performed Overall Cognitive Status: Within Functional Limits for tasks assessed       End of Session PT - End of Session Equipment Utilized During Treatment: Gait belt;Back brace Activity Tolerance: Patient tolerated treatment well Patient left: in bed;with call bell/phone within reach;with family/visitor present Nurse Communication: Mobility status   GP     Sallyanne Kuster 06/13/2013, 12:33 PM  Sallyanne Kuster, PTA Office- [phone]

## 2013-06-14 DIAGNOSIS — Z4789 Encounter for other orthopedic aftercare: Secondary | ICD-10-CM | POA: Diagnosis not present

## 2013-06-14 DIAGNOSIS — R269 Unspecified abnormalities of gait and mobility: Secondary | ICD-10-CM | POA: Diagnosis not present

## 2013-06-14 DIAGNOSIS — IMO0001 Reserved for inherently not codable concepts without codable children: Secondary | ICD-10-CM | POA: Diagnosis not present

## 2013-06-14 DIAGNOSIS — M545 Low back pain: Secondary | ICD-10-CM | POA: Diagnosis not present

## 2013-06-14 DIAGNOSIS — M6281 Muscle weakness (generalized): Secondary | ICD-10-CM | POA: Diagnosis not present

## 2013-06-15 DIAGNOSIS — Z4789 Encounter for other orthopedic aftercare: Secondary | ICD-10-CM | POA: Diagnosis not present

## 2013-06-15 DIAGNOSIS — R269 Unspecified abnormalities of gait and mobility: Secondary | ICD-10-CM | POA: Diagnosis not present

## 2013-06-15 DIAGNOSIS — M6281 Muscle weakness (generalized): Secondary | ICD-10-CM | POA: Diagnosis not present

## 2013-06-15 DIAGNOSIS — M545 Low back pain: Secondary | ICD-10-CM | POA: Diagnosis not present

## 2013-06-15 DIAGNOSIS — IMO0001 Reserved for inherently not codable concepts without codable children: Secondary | ICD-10-CM | POA: Diagnosis not present

## 2013-06-16 ENCOUNTER — Telehealth: Payer: Self-pay | Admitting: Family Medicine

## 2013-06-16 NOTE — Telephone Encounter (Signed)
Left message at patients home to either bring by urine or call for appt tomorrow to see another provider at out practice. Left message since it is 5:00 pm

## 2013-06-16 NOTE — Telephone Encounter (Signed)
Needs urine for UA with culture.  She or family can drop it by this time given recent surgery  if no appts available.

## 2013-06-16 NOTE — Telephone Encounter (Signed)
Patient Information:  Caller Name: Ciji  Phone: [phone]  Patient: Tracy Dixon, Tracy Dixon  Gender: Female  DOB: 06-May-1941  Age: 72 Years  PCP: Kerby Nora (Family Practice)  Office Follow Up:  Does the office need to follow up with this patient?: Yes  Instructions For The Office: Asks for Rx for urinary symptoms; had back surgery 06/10/13 with catheter.  Refused to go to another [place] location or Urgent Care.  States she will try to contact surgeon who did her procedure.  Pharmacy information in Notes section.  RN Note:  Statistician at [employer]  [phone]  Symptoms  Reason For Call & Symptoms: Patient calling.  States she  had back surgery 06/10/13 , thinks she has UTI.  Frequent urination with inontinence; had catheter during / after surgery.    States PCP usually orders antibiotics for same.Temp 100.3.  Pain with urination rated at 4-5 of 10.  See Today in Office per Urination Pain - Female guideline due to "Age > 50 years.  Caller requested Rx, declined appointment at another [place] office.  Reviewed Health History In EMR: Yes  Reviewed Medications In EMR: Yes  Reviewed Allergies In EMR: Yes  Reviewed Surgeries / Procedures: Yes  Date of Onset of Symptoms: 06/15/2013  Any Fever: Yes  Fever Taken: Oral  Fever Time Of Reading: 14:55:00  Fever Last Reading: 100.3  Guideline(s) Used:  Urination Pain - Female  Disposition Per Guideline:   See Today in Office  Reason For Disposition Reached:   Age > 50 years  Advice Given:  Fluids:   Drink extra fluids. Drink 8-10 glasses of liquids a day (Reason: to produce a dilute, non-irritating urine).  Cranberry Juice:   Caution: Do not drink more than 16 oz (480 ml). Here is the reason: too much cranberry juice can also be irritating to the bladder.  Call Back If:  You become worse.  Patient Refused Recommendation:  Patient Requests Prescription  Asks for Rx for urinary symptoms; had back surgery 06/10/13 with catheter.   Refused to go to another [place] location or Urgent Care.  States she will try to contact surgeon who did her procedure.  Pharmacy information in Notes section.

## 2013-06-17 DIAGNOSIS — R269 Unspecified abnormalities of gait and mobility: Secondary | ICD-10-CM | POA: Diagnosis not present

## 2013-06-17 DIAGNOSIS — M545 Low back pain: Secondary | ICD-10-CM | POA: Diagnosis not present

## 2013-06-17 DIAGNOSIS — Z4789 Encounter for other orthopedic aftercare: Secondary | ICD-10-CM | POA: Diagnosis not present

## 2013-06-17 DIAGNOSIS — M6281 Muscle weakness (generalized): Secondary | ICD-10-CM | POA: Diagnosis not present

## 2013-06-17 DIAGNOSIS — IMO0001 Reserved for inherently not codable concepts without codable children: Secondary | ICD-10-CM | POA: Diagnosis not present

## 2013-06-22 ENCOUNTER — Telehealth (INDEPENDENT_AMBULATORY_CARE_PROVIDER_SITE_OTHER): Payer: Medicare Other | Admitting: *Deleted

## 2013-06-22 DIAGNOSIS — R300 Dysuria: Secondary | ICD-10-CM

## 2013-06-22 LAB — POCT URINALYSIS DIPSTICK
Ketones, UA: NEGATIVE
Protein, UA: 30
Spec Grav, UA: 1.015

## 2013-06-23 ENCOUNTER — Other Ambulatory Visit: Payer: Self-pay | Admitting: Family Medicine

## 2013-06-23 ENCOUNTER — Other Ambulatory Visit: Payer: Medicare Other

## 2013-06-23 DIAGNOSIS — Z4789 Encounter for other orthopedic aftercare: Secondary | ICD-10-CM | POA: Diagnosis not present

## 2013-06-23 DIAGNOSIS — R300 Dysuria: Secondary | ICD-10-CM | POA: Diagnosis not present

## 2013-06-23 DIAGNOSIS — M545 Low back pain: Secondary | ICD-10-CM | POA: Diagnosis not present

## 2013-06-23 DIAGNOSIS — R269 Unspecified abnormalities of gait and mobility: Secondary | ICD-10-CM | POA: Diagnosis not present

## 2013-06-23 DIAGNOSIS — M6281 Muscle weakness (generalized): Secondary | ICD-10-CM | POA: Diagnosis not present

## 2013-06-23 DIAGNOSIS — IMO0001 Reserved for inherently not codable concepts without codable children: Secondary | ICD-10-CM | POA: Diagnosis not present

## 2013-06-23 MED ORDER — CIPROFLOXACIN HCL 250 MG PO TABS: 250.0000 mg | ORAL_TABLET | Freq: Two times a day (BID) | ORAL | Status: DC

## 2013-06-23 NOTE — Telephone Encounter (Signed)
Error

## 2013-06-25 DIAGNOSIS — M6281 Muscle weakness (generalized): Secondary | ICD-10-CM | POA: Diagnosis not present

## 2013-06-25 DIAGNOSIS — R269 Unspecified abnormalities of gait and mobility: Secondary | ICD-10-CM | POA: Diagnosis not present

## 2013-06-25 DIAGNOSIS — IMO0001 Reserved for inherently not codable concepts without codable children: Secondary | ICD-10-CM | POA: Diagnosis not present

## 2013-06-25 DIAGNOSIS — M545 Low back pain: Secondary | ICD-10-CM | POA: Diagnosis not present

## 2013-06-25 DIAGNOSIS — Z4789 Encounter for other orthopedic aftercare: Secondary | ICD-10-CM | POA: Diagnosis not present

## 2013-06-26 DIAGNOSIS — M48061 Spinal stenosis, lumbar region without neurogenic claudication: Secondary | ICD-10-CM | POA: Diagnosis not present

## 2013-06-26 LAB — URINE CULTURE

## 2013-06-30 DIAGNOSIS — R269 Unspecified abnormalities of gait and mobility: Secondary | ICD-10-CM | POA: Diagnosis not present

## 2013-06-30 DIAGNOSIS — M6281 Muscle weakness (generalized): Secondary | ICD-10-CM | POA: Diagnosis not present

## 2013-06-30 DIAGNOSIS — M545 Low back pain: Secondary | ICD-10-CM | POA: Diagnosis not present

## 2013-06-30 DIAGNOSIS — IMO0001 Reserved for inherently not codable concepts without codable children: Secondary | ICD-10-CM | POA: Diagnosis not present

## 2013-06-30 DIAGNOSIS — Z4789 Encounter for other orthopedic aftercare: Secondary | ICD-10-CM | POA: Diagnosis not present

## 2013-07-07 DIAGNOSIS — Z4789 Encounter for other orthopedic aftercare: Secondary | ICD-10-CM | POA: Diagnosis not present

## 2013-07-07 DIAGNOSIS — M545 Low back pain: Secondary | ICD-10-CM | POA: Diagnosis not present

## 2013-07-07 DIAGNOSIS — R269 Unspecified abnormalities of gait and mobility: Secondary | ICD-10-CM | POA: Diagnosis not present

## 2013-07-07 DIAGNOSIS — IMO0001 Reserved for inherently not codable concepts without codable children: Secondary | ICD-10-CM | POA: Diagnosis not present

## 2013-07-07 DIAGNOSIS — M6281 Muscle weakness (generalized): Secondary | ICD-10-CM | POA: Diagnosis not present

## 2013-07-24 DIAGNOSIS — Z981 Arthrodesis status: Secondary | ICD-10-CM | POA: Diagnosis not present

## 2013-08-04 ENCOUNTER — Ambulatory Visit (INDEPENDENT_AMBULATORY_CARE_PROVIDER_SITE_OTHER): Payer: Medicare Other | Admitting: Family Medicine

## 2013-08-04 ENCOUNTER — Encounter: Payer: Medicare Other | Admitting: Family Medicine

## 2013-08-04 DIAGNOSIS — Z23 Encounter for immunization: Secondary | ICD-10-CM | POA: Diagnosis not present

## 2013-08-05 ENCOUNTER — Encounter: Payer: Self-pay | Admitting: Family Medicine

## 2013-08-06 ENCOUNTER — Encounter: Payer: Self-pay | Admitting: Family Medicine

## 2013-08-06 DIAGNOSIS — M545 Low back pain: Secondary | ICD-10-CM | POA: Diagnosis not present

## 2013-08-06 NOTE — Telephone Encounter (Signed)
Can you fax the info as requested and let the pt know if it can be done?

## 2013-08-06 NOTE — Telephone Encounter (Signed)
Nerve Conduction Study faxed to Dr. Romana Juniper.

## 2013-08-07 ENCOUNTER — Telehealth: Payer: Self-pay

## 2013-08-07 DIAGNOSIS — G5600 Carpal tunnel syndrome, unspecified upper limb: Secondary | ICD-10-CM | POA: Diagnosis not present

## 2013-08-07 NOTE — Telephone Encounter (Signed)
Dr.Gramig office request nerve conduction study faxed to [phone];  spoke with Angie to let know done.

## 2013-08-11 DIAGNOSIS — M545 Low back pain: Secondary | ICD-10-CM | POA: Diagnosis not present

## 2013-08-13 DIAGNOSIS — M545 Low back pain: Secondary | ICD-10-CM | POA: Diagnosis not present

## 2013-08-14 DIAGNOSIS — B351 Tinea unguium: Secondary | ICD-10-CM | POA: Diagnosis not present

## 2013-08-14 DIAGNOSIS — L84 Corns and callosities: Secondary | ICD-10-CM | POA: Diagnosis not present

## 2013-08-17 DIAGNOSIS — M25549 Pain in joints of unspecified hand: Secondary | ICD-10-CM | POA: Diagnosis not present

## 2013-08-17 DIAGNOSIS — G5600 Carpal tunnel syndrome, unspecified upper limb: Secondary | ICD-10-CM | POA: Diagnosis not present

## 2013-08-17 DIAGNOSIS — M545 Low back pain: Secondary | ICD-10-CM | POA: Diagnosis not present

## 2013-08-18 ENCOUNTER — Telehealth: Payer: Self-pay | Admitting: *Deleted

## 2013-08-18 DIAGNOSIS — Z23 Encounter for immunization: Secondary | ICD-10-CM

## 2013-08-18 MED ORDER — ZOSTER VACCINE LIVE 19400 UNT/0.65ML ~~LOC~~ SOLR: 0.6500 mL | Freq: Once | SUBCUTANEOUS | Status: DC

## 2013-08-18 NOTE — Telephone Encounter (Signed)
Rx in out box

## 2013-08-18 NOTE — Telephone Encounter (Signed)
Mrs. Tracy Dixon left voicemail stating she had checked with her insurance and the Shingles vaccine would be covered under their prescription plan. Requesting appointment to have this done.  Advised Tracy Dixon that if it was going to be covered by her prescription plan,  She will need to have this done at a pharmacy.  Will send prescription in to [facility] [address]

## 2013-08-19 DIAGNOSIS — M545 Low back pain: Secondary | ICD-10-CM | POA: Diagnosis not present

## 2013-08-19 NOTE — Telephone Encounter (Signed)
Order was sent to [facility] electronically. 

## 2013-08-24 DIAGNOSIS — M545 Low back pain: Secondary | ICD-10-CM | POA: Diagnosis not present

## 2013-08-26 DIAGNOSIS — M545 Low back pain: Secondary | ICD-10-CM | POA: Diagnosis not present

## 2013-08-26 DIAGNOSIS — G5600 Carpal tunnel syndrome, unspecified upper limb: Secondary | ICD-10-CM | POA: Diagnosis not present

## 2013-08-26 DIAGNOSIS — M19049 Primary osteoarthritis, unspecified hand: Secondary | ICD-10-CM | POA: Diagnosis not present

## 2013-08-27 DIAGNOSIS — G5600 Carpal tunnel syndrome, unspecified upper limb: Secondary | ICD-10-CM | POA: Diagnosis not present

## 2013-09-01 DIAGNOSIS — M545 Low back pain: Secondary | ICD-10-CM | POA: Diagnosis not present

## 2013-09-03 DIAGNOSIS — M545 Low back pain: Secondary | ICD-10-CM | POA: Diagnosis not present

## 2013-09-04 DIAGNOSIS — M545 Low back pain: Secondary | ICD-10-CM | POA: Diagnosis not present

## 2013-09-07 DIAGNOSIS — M545 Low back pain: Secondary | ICD-10-CM | POA: Diagnosis not present

## 2013-09-08 DIAGNOSIS — M79609 Pain in unspecified limb: Secondary | ICD-10-CM | POA: Diagnosis not present

## 2013-09-10 DIAGNOSIS — M79609 Pain in unspecified limb: Secondary | ICD-10-CM | POA: Diagnosis not present

## 2013-09-15 DIAGNOSIS — M79609 Pain in unspecified limb: Secondary | ICD-10-CM | POA: Diagnosis not present

## 2013-09-17 DIAGNOSIS — M79609 Pain in unspecified limb: Secondary | ICD-10-CM | POA: Diagnosis not present

## 2013-09-18 ENCOUNTER — Telehealth: Payer: Self-pay | Admitting: Family Medicine

## 2013-09-18 ENCOUNTER — Other Ambulatory Visit (INDEPENDENT_AMBULATORY_CARE_PROVIDER_SITE_OTHER): Payer: Medicare Other

## 2013-09-18 DIAGNOSIS — M899 Disorder of bone, unspecified: Secondary | ICD-10-CM

## 2013-09-18 DIAGNOSIS — E785 Hyperlipidemia, unspecified: Secondary | ICD-10-CM | POA: Diagnosis not present

## 2013-09-18 LAB — LIPID PANEL
Cholesterol: 186 mg/dL (ref 0–200)
HDL: 46.8 mg/dL (ref >39.00)
Total CHOL/HDL Ratio: 4
Triglycerides: 66 mg/dL (ref 0.0–149.0)
VLDL: 13.2 mg/dL (ref 0.0–40.0)

## 2013-09-18 LAB — COMPREHENSIVE METABOLIC PANEL
ALT: 16 U/L (ref 0–35)
AST: 20 U/L (ref 0–37)
Alkaline Phosphatase: 60 U/L (ref 39–117)
Calcium: 9.2 mg/dL (ref 8.4–10.5)
Creatinine, Ser: 0.7 mg/dL (ref 0.4–1.2)
GFR: 100.63 mL/min (ref >60.00)
Total Bilirubin: 0.3 mg/dL (ref 0.3–1.2)

## 2013-09-18 NOTE — Telephone Encounter (Signed)
Message copied by Excell Seltzer on Fri Sep 18, 2013  8:28 AM ------      Message from: Alvina Chou      Created: Fri Sep 04, 2013  4:36 PM      Regarding: Lab orders for Friday, 10.24.14       Patient is scheduled for CPX labs, please order future labs, Thanks , Tracy Dixon       ------

## 2013-09-19 LAB — VITAMIN D 25 HYDROXY (VIT D DEFICIENCY, FRACTURES): Vit D, 25-Hydroxy: 52 ng/mL (ref 30–89)

## 2013-09-21 ENCOUNTER — Ambulatory Visit: Payer: Self-pay | Admitting: Family Medicine

## 2013-09-21 DIAGNOSIS — M79609 Pain in unspecified limb: Secondary | ICD-10-CM | POA: Diagnosis not present

## 2013-09-21 DIAGNOSIS — Z1231 Encounter for screening mammogram for malignant neoplasm of breast: Secondary | ICD-10-CM | POA: Diagnosis not present

## 2013-09-22 ENCOUNTER — Encounter: Payer: Self-pay | Admitting: Family Medicine

## 2013-09-22 ENCOUNTER — Ambulatory Visit (INDEPENDENT_AMBULATORY_CARE_PROVIDER_SITE_OTHER): Payer: Medicare Other | Admitting: Family Medicine

## 2013-09-22 ENCOUNTER — Encounter: Payer: Medicare Other | Admitting: Family Medicine

## 2013-09-22 VITALS — BP 120/80 | HR 83 | Temp 98.3°F | Ht 61.0 in | Wt 166.0 lb

## 2013-09-22 DIAGNOSIS — M79609 Pain in unspecified limb: Secondary | ICD-10-CM | POA: Diagnosis not present

## 2013-09-22 DIAGNOSIS — Z0000 Encounter for general adult medical examination without abnormal findings: Secondary | ICD-10-CM | POA: Diagnosis not present

## 2013-09-22 DIAGNOSIS — E785 Hyperlipidemia, unspecified: Secondary | ICD-10-CM

## 2013-09-22 DIAGNOSIS — M899 Disorder of bone, unspecified: Secondary | ICD-10-CM

## 2013-09-22 DIAGNOSIS — M48061 Spinal stenosis, lumbar region without neurogenic claudication: Secondary | ICD-10-CM

## 2013-09-22 DIAGNOSIS — I10 Essential (primary) hypertension: Secondary | ICD-10-CM

## 2013-09-22 MED ORDER — HYDROCHLOROTHIAZIDE 25 MG PO TABS: 25.0000 mg | ORAL_TABLET | Freq: Every day | ORAL | Status: DC

## 2013-09-22 MED ORDER — AMLODIPINE BESYLATE 10 MG PO TABS: 10.0000 mg | ORAL_TABLET | Freq: Every day | ORAL | Status: DC

## 2013-09-22 MED ORDER — ALBUTEROL SULFATE HFA 108 (90 BASE) MCG/ACT IN AERS: 1.0000 | INHALATION_SPRAY | Freq: Four times a day (QID) | RESPIRATORY_TRACT | Status: DC | PRN

## 2013-09-22 NOTE — Assessment & Plan Note (Signed)
Vit D at goal.

## 2013-09-22 NOTE — Assessment & Plan Note (Signed)
At goal on no med. Encouraged exercise, weight loss, healthy eating habits.  

## 2013-09-22 NOTE — Progress Notes (Signed)
I have personally reviewed the Medicare Annual Wellness questionnaire and have noted  1. The patient's medical and social history  2. Their use of alcohol, tobacco or illicit drugs  3. Their current medications and supplements  4. The patient's functional ability including ADL's, fall risks, home safety risks and hearing or visual  impairment.  5. Diet and physical activities  6. Evidence for depression or mood disorders  The patients weight, height, BMI and visual acuity have been recorded in the chart  I have made referrals, counseling and provided education to the patient based review of the above and I have provided the pt with a written personalized care plan for preventive services.    Since last OV she has undergone surgery for spinal stenosis, rods placed... Dr. Shon Baton 06/10/2013  No further leg pain, still some low back pain if standing a long time.  She has been going to PE. She has been walking. She has also had left carpal tunnel surgery.. Dr.Gramig.Marland Kitchen4 weeks ago 10/2  Hypertension: Well controlled on norvasc and HCTZ.  Wt Readings from Last 3 Encounters:  09/22/13 166 lb (75.297 kg)  06/11/13 159 lb (72.122 kg)  06/11/13 159 lb (72.122 kg)  Using medication without problems or lightheadedness:  Chest pain with exertion: None  Edema: Daily, wearing compression hose when it is not hot. Has varicose veins.  Short of breath: None  Average home BPs: Occ, normal at other MD visits. Other issues:   Elevated Cholesterol: Now at goal LDL <130 on no medication.  Lab Results  Component Value Date   CHOL 186 09/18/2013   HDL 46.80 09/18/2013   LDLCALC 126* 09/18/2013   TRIG 66.0 09/18/2013   CHOLHDL 4 09/18/2013    Review of Systems ROS:  See HPI.  Otherwise negative. No depression, no fatigue  Objective:   Physical Exam  Constitutional: Vital signs are normal. She appears well-developed and well-nourished. She is cooperative. Non-toxic appearance. She does not appear ill. No  distress.  HENT:  Head: Normocephalic.  Right Ear: Hearing, tympanic membrane, external ear and ear canal normal.  Left Ear: Hearing, tympanic membrane, external ear and ear canal normal.  Nose: Nose normal.  Eyes: Conjunctivae, EOM and lids are normal. Pupils are equal, round, and reactive to light. No foreign bodies found.  Neck: Trachea normal and normal range of motion. Neck supple. Carotid bruit is not present. No mass and no thyromegaly present.  Cardiovascular: Normal rate, regular rhythm, S1 normal, S2 normal, normal heart sounds and intact distal pulses. Exam reveals no gallop.  No murmur heard.  Pulmonary/Chest: Effort normal and breath sounds normal. No respiratory distress. She has no wheezes. She has no rhonchi. She has no rales.  Abdominal: Soft. Normal appearance and bowel sounds are normal. She exhibits no distension, no fluid wave, no abdominal bruit and no mass. There is no hepatosplenomegaly. No tenderness.. There is no rebound, no guarding and no CVA tenderness. No hernia.  Genitourinary: No breast swelling, tenderness, discharge or bleeding. Pelvic exam was performed with patient prone.  Musculoskeletal:  No focal vertebral pain or pain over parapspinous muscles, neg SLR, neg Faber's  Lymphadenopathy:  She has no cervical adenopathy.  She has no axillary adenopathy.  Neurological: She is alert. She has normal strength. No cranial nerve deficit or sensory deficit.  Skin: Skin is warm, dry and intact. No rash noted.  Psychiatric: Her speech is normal and behavior is normal. Judgment normal. Her mood appears not anxious. Cognition and memory are normal.  She does not exhibit a depressed mood.  Assessment & Plan:   Annual Medicare Wellness: The patient's preventative maintenance and recommended screening tests for an annual wellness exam were reviewed in full today.  Brought up to date unless services declined.  Counselled on the importance of diet, exercise, and its role in  overall health and mortality.  The patient's FH and SH was reviewed, including their home life, tobacco status, and drug and alcohol status.   Up to date with TDap, zostavax, flu and PNA vaccine Last colonoscopy 2008, next due in 10 years.  Mammo: nml 09/16/2012,  Had done yesterday. Has had BTL... No pap needed, no DVE .Marland KitchenMarland Kitchen No family history of uterine or ovarian cancer. No vaginal bleeding, no discharge, mild dryness.  Bone density: last in 2012 showed stable osteopenia, repeat in 5 years

## 2013-09-22 NOTE — Assessment & Plan Note (Signed)
S/P spinal fusion 2014

## 2013-09-22 NOTE — Assessment & Plan Note (Signed)
Well controlled. Continue current medication.  

## 2013-09-23 DIAGNOSIS — M79609 Pain in unspecified limb: Secondary | ICD-10-CM | POA: Diagnosis not present

## 2013-09-25 ENCOUNTER — Encounter: Payer: Medicare Other | Admitting: Family Medicine

## 2013-09-29 DIAGNOSIS — M79609 Pain in unspecified limb: Secondary | ICD-10-CM | POA: Diagnosis not present

## 2013-10-10 ENCOUNTER — Other Ambulatory Visit: Payer: Self-pay | Admitting: Family Medicine

## 2013-11-24 DIAGNOSIS — M79609 Pain in unspecified limb: Secondary | ICD-10-CM | POA: Diagnosis not present

## 2013-12-11 DIAGNOSIS — M545 Low back pain, unspecified: Secondary | ICD-10-CM | POA: Diagnosis not present

## 2013-12-11 DIAGNOSIS — R262 Difficulty in walking, not elsewhere classified: Secondary | ICD-10-CM | POA: Diagnosis not present

## 2013-12-11 DIAGNOSIS — R293 Abnormal posture: Secondary | ICD-10-CM | POA: Diagnosis not present

## 2013-12-15 DIAGNOSIS — M545 Low back pain, unspecified: Secondary | ICD-10-CM | POA: Diagnosis not present

## 2013-12-15 DIAGNOSIS — R293 Abnormal posture: Secondary | ICD-10-CM | POA: Diagnosis not present

## 2013-12-15 DIAGNOSIS — R262 Difficulty in walking, not elsewhere classified: Secondary | ICD-10-CM | POA: Diagnosis not present

## 2013-12-17 DIAGNOSIS — M545 Low back pain, unspecified: Secondary | ICD-10-CM | POA: Diagnosis not present

## 2013-12-17 DIAGNOSIS — R262 Difficulty in walking, not elsewhere classified: Secondary | ICD-10-CM | POA: Diagnosis not present

## 2013-12-17 DIAGNOSIS — R293 Abnormal posture: Secondary | ICD-10-CM | POA: Diagnosis not present

## 2013-12-18 DIAGNOSIS — M545 Low back pain, unspecified: Secondary | ICD-10-CM | POA: Diagnosis not present

## 2013-12-18 DIAGNOSIS — R262 Difficulty in walking, not elsewhere classified: Secondary | ICD-10-CM | POA: Diagnosis not present

## 2013-12-18 DIAGNOSIS — R293 Abnormal posture: Secondary | ICD-10-CM | POA: Diagnosis not present

## 2013-12-21 DIAGNOSIS — M545 Low back pain, unspecified: Secondary | ICD-10-CM | POA: Diagnosis not present

## 2013-12-21 DIAGNOSIS — R293 Abnormal posture: Secondary | ICD-10-CM | POA: Diagnosis not present

## 2013-12-21 DIAGNOSIS — R262 Difficulty in walking, not elsewhere classified: Secondary | ICD-10-CM | POA: Diagnosis not present

## 2013-12-22 DIAGNOSIS — R262 Difficulty in walking, not elsewhere classified: Secondary | ICD-10-CM | POA: Diagnosis not present

## 2013-12-22 DIAGNOSIS — M545 Low back pain, unspecified: Secondary | ICD-10-CM | POA: Diagnosis not present

## 2013-12-22 DIAGNOSIS — R293 Abnormal posture: Secondary | ICD-10-CM | POA: Diagnosis not present

## 2013-12-24 DIAGNOSIS — R262 Difficulty in walking, not elsewhere classified: Secondary | ICD-10-CM | POA: Diagnosis not present

## 2013-12-24 DIAGNOSIS — R293 Abnormal posture: Secondary | ICD-10-CM | POA: Diagnosis not present

## 2013-12-24 DIAGNOSIS — M545 Low back pain, unspecified: Secondary | ICD-10-CM | POA: Diagnosis not present

## 2013-12-28 DIAGNOSIS — M545 Low back pain, unspecified: Secondary | ICD-10-CM | POA: Diagnosis not present

## 2013-12-28 DIAGNOSIS — R293 Abnormal posture: Secondary | ICD-10-CM | POA: Diagnosis not present

## 2013-12-28 DIAGNOSIS — R262 Difficulty in walking, not elsewhere classified: Secondary | ICD-10-CM | POA: Diagnosis not present

## 2013-12-29 DIAGNOSIS — M545 Low back pain, unspecified: Secondary | ICD-10-CM | POA: Diagnosis not present

## 2013-12-29 DIAGNOSIS — R262 Difficulty in walking, not elsewhere classified: Secondary | ICD-10-CM | POA: Diagnosis not present

## 2013-12-29 DIAGNOSIS — R293 Abnormal posture: Secondary | ICD-10-CM | POA: Diagnosis not present

## 2013-12-31 DIAGNOSIS — M545 Low back pain, unspecified: Secondary | ICD-10-CM | POA: Diagnosis not present

## 2013-12-31 DIAGNOSIS — R293 Abnormal posture: Secondary | ICD-10-CM | POA: Diagnosis not present

## 2013-12-31 DIAGNOSIS — R262 Difficulty in walking, not elsewhere classified: Secondary | ICD-10-CM | POA: Diagnosis not present

## 2014-01-04 DIAGNOSIS — M545 Low back pain, unspecified: Secondary | ICD-10-CM | POA: Diagnosis not present

## 2014-01-04 DIAGNOSIS — R293 Abnormal posture: Secondary | ICD-10-CM | POA: Diagnosis not present

## 2014-01-04 DIAGNOSIS — R262 Difficulty in walking, not elsewhere classified: Secondary | ICD-10-CM | POA: Diagnosis not present

## 2014-01-06 DIAGNOSIS — R262 Difficulty in walking, not elsewhere classified: Secondary | ICD-10-CM | POA: Diagnosis not present

## 2014-01-06 DIAGNOSIS — R293 Abnormal posture: Secondary | ICD-10-CM | POA: Diagnosis not present

## 2014-01-06 DIAGNOSIS — M545 Low back pain, unspecified: Secondary | ICD-10-CM | POA: Diagnosis not present

## 2014-01-07 DIAGNOSIS — M545 Low back pain, unspecified: Secondary | ICD-10-CM | POA: Diagnosis not present

## 2014-01-07 DIAGNOSIS — R293 Abnormal posture: Secondary | ICD-10-CM | POA: Diagnosis not present

## 2014-01-07 DIAGNOSIS — R262 Difficulty in walking, not elsewhere classified: Secondary | ICD-10-CM | POA: Diagnosis not present

## 2014-01-11 DIAGNOSIS — R262 Difficulty in walking, not elsewhere classified: Secondary | ICD-10-CM | POA: Diagnosis not present

## 2014-01-11 DIAGNOSIS — M545 Low back pain, unspecified: Secondary | ICD-10-CM | POA: Diagnosis not present

## 2014-01-11 DIAGNOSIS — R293 Abnormal posture: Secondary | ICD-10-CM | POA: Diagnosis not present

## 2014-01-13 DIAGNOSIS — R262 Difficulty in walking, not elsewhere classified: Secondary | ICD-10-CM | POA: Diagnosis not present

## 2014-01-13 DIAGNOSIS — M545 Low back pain, unspecified: Secondary | ICD-10-CM | POA: Diagnosis not present

## 2014-01-13 DIAGNOSIS — R293 Abnormal posture: Secondary | ICD-10-CM | POA: Diagnosis not present

## 2014-01-14 DIAGNOSIS — R262 Difficulty in walking, not elsewhere classified: Secondary | ICD-10-CM | POA: Diagnosis not present

## 2014-01-14 DIAGNOSIS — M545 Low back pain, unspecified: Secondary | ICD-10-CM | POA: Diagnosis not present

## 2014-01-14 DIAGNOSIS — R293 Abnormal posture: Secondary | ICD-10-CM | POA: Diagnosis not present

## 2014-01-18 DIAGNOSIS — R262 Difficulty in walking, not elsewhere classified: Secondary | ICD-10-CM | POA: Diagnosis not present

## 2014-01-18 DIAGNOSIS — M545 Low back pain, unspecified: Secondary | ICD-10-CM | POA: Diagnosis not present

## 2014-01-18 DIAGNOSIS — R293 Abnormal posture: Secondary | ICD-10-CM | POA: Diagnosis not present

## 2014-01-20 DIAGNOSIS — R293 Abnormal posture: Secondary | ICD-10-CM | POA: Diagnosis not present

## 2014-01-20 DIAGNOSIS — R262 Difficulty in walking, not elsewhere classified: Secondary | ICD-10-CM | POA: Diagnosis not present

## 2014-01-20 DIAGNOSIS — M545 Low back pain, unspecified: Secondary | ICD-10-CM | POA: Diagnosis not present

## 2014-01-22 DIAGNOSIS — M545 Low back pain, unspecified: Secondary | ICD-10-CM | POA: Diagnosis not present

## 2014-01-22 DIAGNOSIS — R262 Difficulty in walking, not elsewhere classified: Secondary | ICD-10-CM | POA: Diagnosis not present

## 2014-01-22 DIAGNOSIS — R293 Abnormal posture: Secondary | ICD-10-CM | POA: Diagnosis not present

## 2014-01-25 DIAGNOSIS — M545 Low back pain, unspecified: Secondary | ICD-10-CM | POA: Diagnosis not present

## 2014-01-25 DIAGNOSIS — R293 Abnormal posture: Secondary | ICD-10-CM | POA: Diagnosis not present

## 2014-01-25 DIAGNOSIS — R262 Difficulty in walking, not elsewhere classified: Secondary | ICD-10-CM | POA: Diagnosis not present

## 2014-01-27 DIAGNOSIS — M545 Low back pain, unspecified: Secondary | ICD-10-CM | POA: Diagnosis not present

## 2014-01-27 DIAGNOSIS — R293 Abnormal posture: Secondary | ICD-10-CM | POA: Diagnosis not present

## 2014-01-27 DIAGNOSIS — R262 Difficulty in walking, not elsewhere classified: Secondary | ICD-10-CM | POA: Diagnosis not present

## 2014-01-29 DIAGNOSIS — M545 Low back pain, unspecified: Secondary | ICD-10-CM | POA: Diagnosis not present

## 2014-01-29 DIAGNOSIS — R262 Difficulty in walking, not elsewhere classified: Secondary | ICD-10-CM | POA: Diagnosis not present

## 2014-01-29 DIAGNOSIS — R293 Abnormal posture: Secondary | ICD-10-CM | POA: Diagnosis not present

## 2014-02-01 DIAGNOSIS — M545 Low back pain, unspecified: Secondary | ICD-10-CM | POA: Diagnosis not present

## 2014-02-01 DIAGNOSIS — R293 Abnormal posture: Secondary | ICD-10-CM | POA: Diagnosis not present

## 2014-02-01 DIAGNOSIS — R262 Difficulty in walking, not elsewhere classified: Secondary | ICD-10-CM | POA: Diagnosis not present

## 2014-02-19 ENCOUNTER — Encounter: Payer: Self-pay | Admitting: Podiatry

## 2014-02-19 ENCOUNTER — Ambulatory Visit (INDEPENDENT_AMBULATORY_CARE_PROVIDER_SITE_OTHER): Payer: Medicare Other | Admitting: Podiatry

## 2014-02-19 VITALS — BP 141/84 | HR 95 | Resp 16

## 2014-02-19 DIAGNOSIS — L84 Corns and callosities: Secondary | ICD-10-CM | POA: Diagnosis not present

## 2014-02-19 DIAGNOSIS — B351 Tinea unguium: Secondary | ICD-10-CM

## 2014-02-19 DIAGNOSIS — M79609 Pain in unspecified limb: Secondary | ICD-10-CM

## 2014-02-19 NOTE — Progress Notes (Signed)
Subjective:     Patient ID: Tracy Dixon, female   DOB: [birth year], 73 y.o.   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

## 2014-03-18 DIAGNOSIS — I890 Lymphedema, not elsewhere classified: Secondary | ICD-10-CM | POA: Diagnosis not present

## 2014-03-18 DIAGNOSIS — I8310 Varicose veins of unspecified lower extremity with inflammation: Secondary | ICD-10-CM | POA: Diagnosis not present

## 2014-03-18 DIAGNOSIS — M7989 Other specified soft tissue disorders: Secondary | ICD-10-CM | POA: Diagnosis not present

## 2014-03-18 DIAGNOSIS — I872 Venous insufficiency (chronic) (peripheral): Secondary | ICD-10-CM | POA: Diagnosis not present

## 2014-03-29 DIAGNOSIS — Z981 Arthrodesis status: Secondary | ICD-10-CM | POA: Diagnosis not present

## 2014-05-04 ENCOUNTER — Ambulatory Visit (INDEPENDENT_AMBULATORY_CARE_PROVIDER_SITE_OTHER): Payer: Medicare Other | Admitting: Podiatry

## 2014-05-04 VITALS — BP 124/66 | HR 90 | Resp 16

## 2014-05-04 DIAGNOSIS — B351 Tinea unguium: Secondary | ICD-10-CM

## 2014-05-04 DIAGNOSIS — M79609 Pain in unspecified limb: Secondary | ICD-10-CM

## 2014-05-04 DIAGNOSIS — M2040 Other hammer toe(s) (acquired), unspecified foot: Secondary | ICD-10-CM | POA: Diagnosis not present

## 2014-05-04 DIAGNOSIS — L84 Corns and callosities: Secondary | ICD-10-CM | POA: Diagnosis not present

## 2014-05-04 NOTE — Progress Notes (Signed)
Subjective:     Patient ID: Tracy Dixon, female   DOB: 1941/10/02, 73 y.o.   MRN: [id number]  HPI patient presents with nail disease 1-5 both feet that are painful and thick and she cannot cut and lesions on both feet that she cannot take care of   Review of Systems     Objective:   Physical Exam Neurovascular status intact with thick nail disease 1-5 both feet and lesions on the bottom of both feet that are painful    Assessment:     Mycotic nail infection with pain both feet and lesion formation both feet    Plan:     Debridement painful nailbeds 1-5 both feet and lesions with no iatrogenic bleeding noted

## 2014-05-27 DIAGNOSIS — I8310 Varicose veins of unspecified lower extremity with inflammation: Secondary | ICD-10-CM | POA: Diagnosis not present

## 2014-05-27 DIAGNOSIS — M79609 Pain in unspecified limb: Secondary | ICD-10-CM | POA: Diagnosis not present

## 2014-05-27 DIAGNOSIS — I872 Venous insufficiency (chronic) (peripheral): Secondary | ICD-10-CM | POA: Diagnosis not present

## 2014-05-27 DIAGNOSIS — M7989 Other specified soft tissue disorders: Secondary | ICD-10-CM | POA: Diagnosis not present

## 2014-06-06 IMAGING — CR DG CHEST 2V
2 series · 2 of 2 positions shown · non-contrast
Comparison: None

CLINICAL DATA: Preoperative evaluation for lumbar fusion, history
hypertension, asthma, former smoker

CHEST - 2 VIEW

[w chest pa]
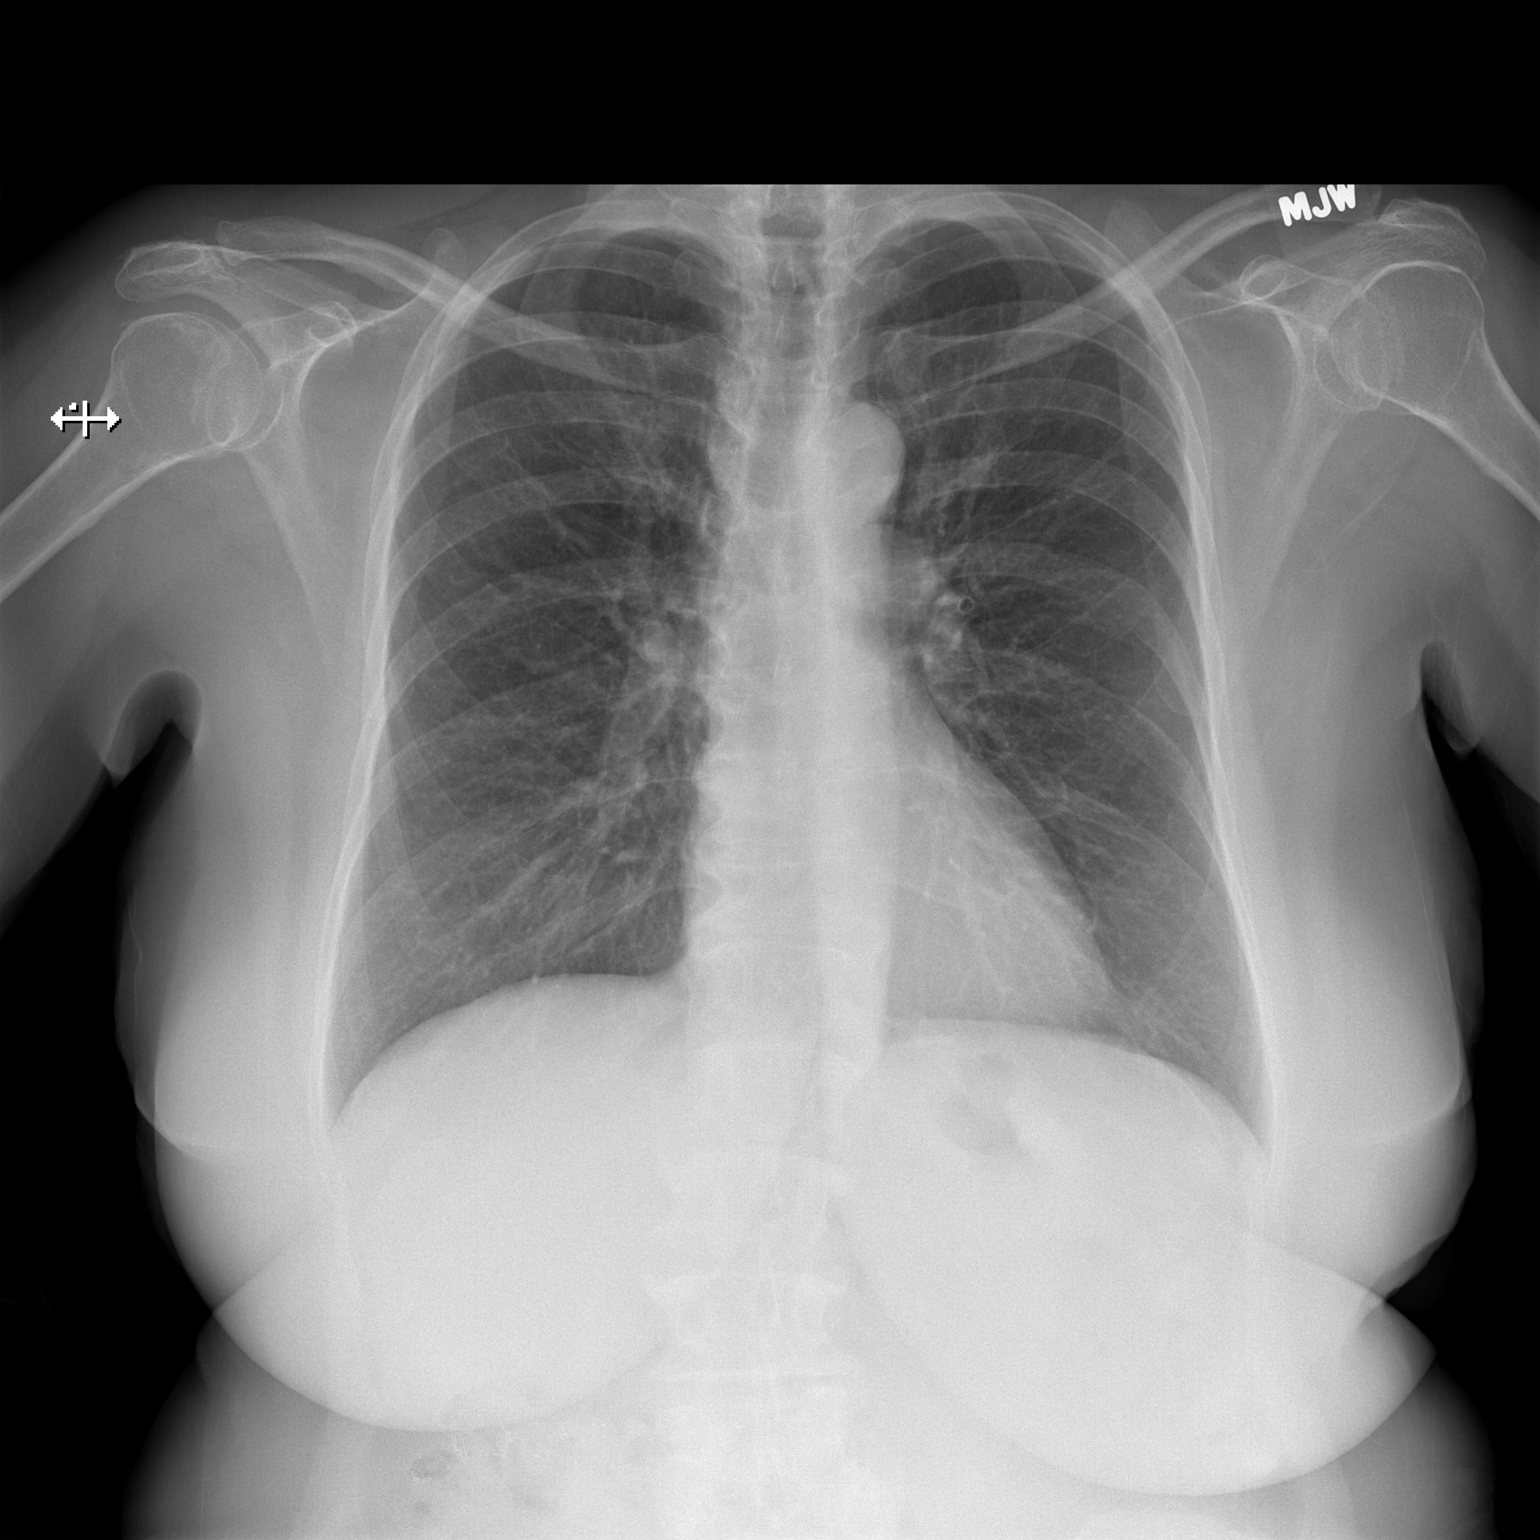

[w chest lat]
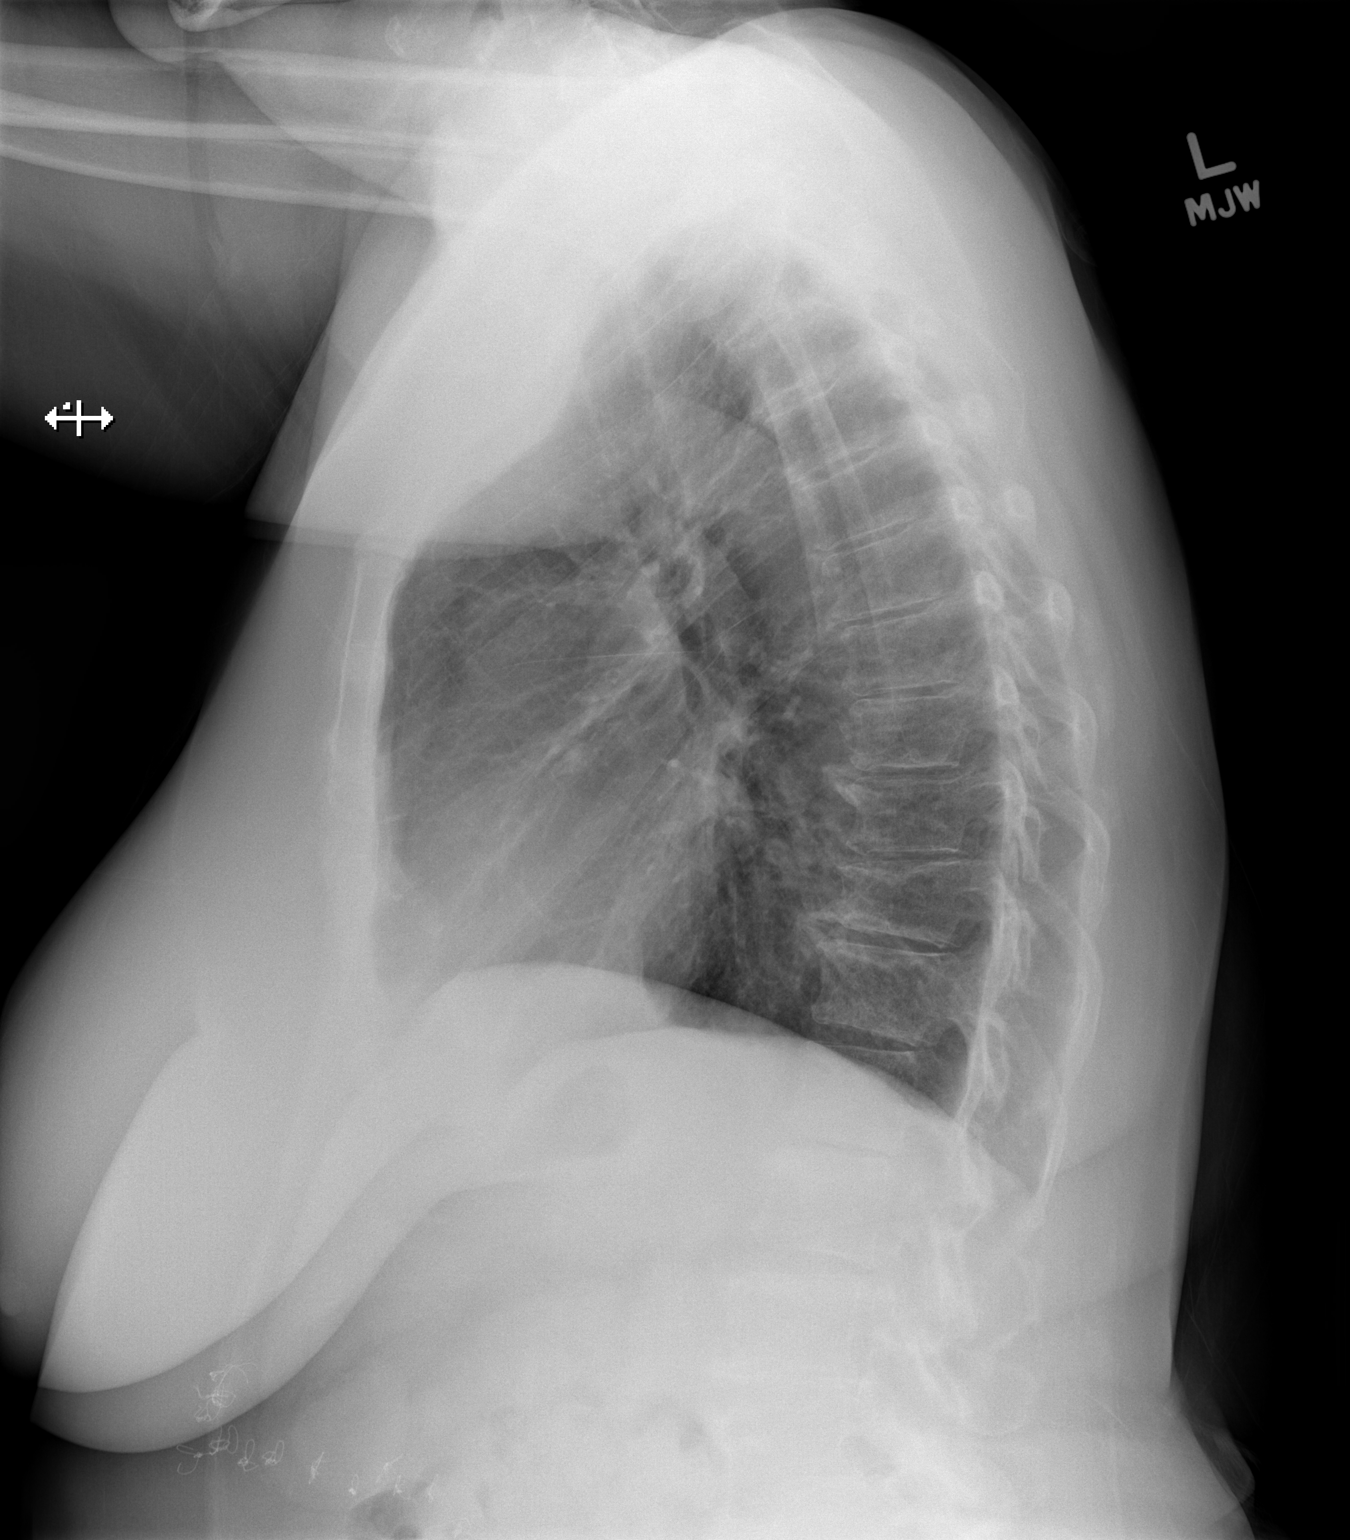

[2 of 2 positions shown; findings below may reference images not displayed]

FINDINGS: Normal heart size, mediastinal contours, and pulmonary vascularity.
Peribronchial thickening.
No pulmonary infiltrate, pleural effusion or pneumothorax.
Bones appear diffusely demineralized with multilevel scattered mild
endplate spur formation of the thoracic spine peri
IMPRESSION: Bronchitic changes.

## 2014-06-14 DIAGNOSIS — M7989 Other specified soft tissue disorders: Secondary | ICD-10-CM | POA: Diagnosis not present

## 2014-06-14 DIAGNOSIS — M79609 Pain in unspecified limb: Secondary | ICD-10-CM | POA: Diagnosis not present

## 2014-06-14 DIAGNOSIS — I8310 Varicose veins of unspecified lower extremity with inflammation: Secondary | ICD-10-CM | POA: Diagnosis not present

## 2014-06-14 IMAGING — RF DG LUMBAR SPINE 2-3V
1 series · 2 of 2 positions shown · non-contrast
Comparison: Plain films 06/02/2013.

CLINICAL DATA: Back pain.

DG C-ARM 61-120 MIN,LUMBAR SPINE - 2-3 VIEW
TECHNIQUE: C-arm fluoroscopic images were obtained
intraoperatively and submitted for postoperative interpretation.
Please see the performing provider's procedural report for the
fluoroscopy time utilized.

[Series 1: run · 2 of 2 slices shown]
[im 1/2]
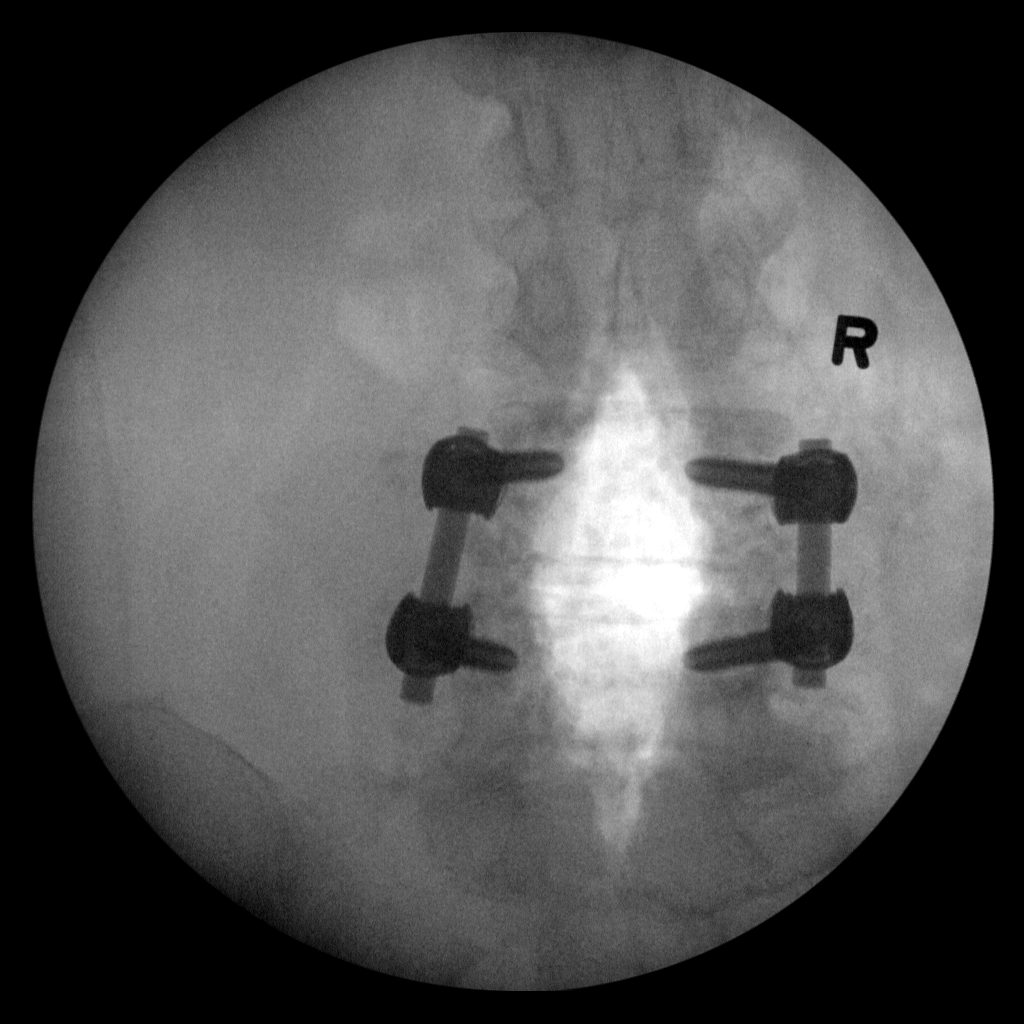
[im 2/2]
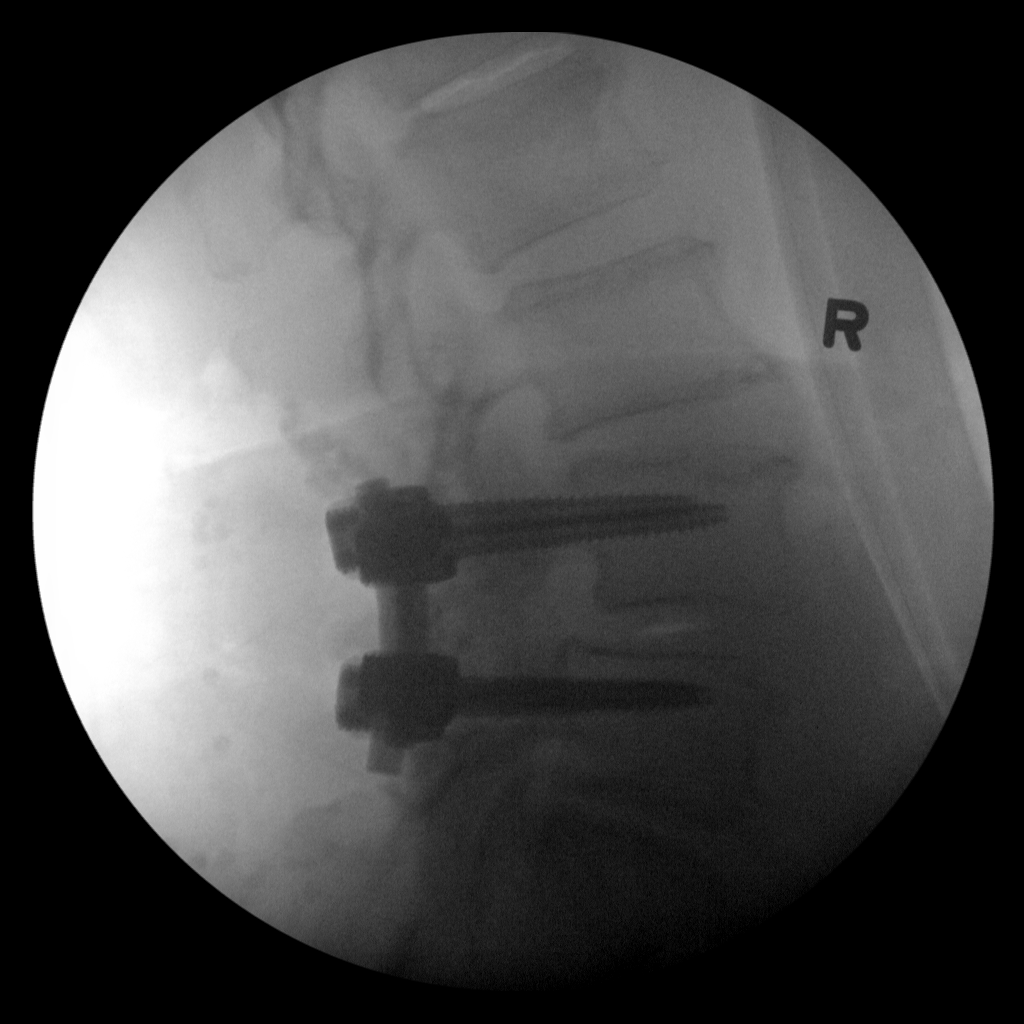

[2 of 2 positions shown; findings below may reference images not displayed]

FINDINGS: L4-5 bilateral pedicle screw and rod construct.  Mild
anterolisthesis.  Antibiotic pellets are seen in the wound.
IMPRESSION: L4-5 fusion as described.

## 2014-06-15 DIAGNOSIS — Z981 Arthrodesis status: Secondary | ICD-10-CM | POA: Diagnosis not present

## 2014-08-05 DIAGNOSIS — R209 Unspecified disturbances of skin sensation: Secondary | ICD-10-CM | POA: Diagnosis not present

## 2014-08-05 DIAGNOSIS — M79609 Pain in unspecified limb: Secondary | ICD-10-CM | POA: Diagnosis not present

## 2014-08-05 DIAGNOSIS — M542 Cervicalgia: Secondary | ICD-10-CM | POA: Diagnosis not present

## 2014-08-05 DIAGNOSIS — G5600 Carpal tunnel syndrome, unspecified upper limb: Secondary | ICD-10-CM | POA: Diagnosis not present

## 2014-08-12 DIAGNOSIS — I8310 Varicose veins of unspecified lower extremity with inflammation: Secondary | ICD-10-CM | POA: Diagnosis not present

## 2014-08-12 DIAGNOSIS — M79609 Pain in unspecified limb: Secondary | ICD-10-CM | POA: Diagnosis not present

## 2014-08-12 DIAGNOSIS — M7989 Other specified soft tissue disorders: Secondary | ICD-10-CM | POA: Diagnosis not present

## 2014-08-12 DIAGNOSIS — I872 Venous insufficiency (chronic) (peripheral): Secondary | ICD-10-CM | POA: Diagnosis not present

## 2014-08-20 ENCOUNTER — Ambulatory Visit: Payer: Medicare Other | Admitting: Podiatry

## 2014-08-24 ENCOUNTER — Ambulatory Visit (INDEPENDENT_AMBULATORY_CARE_PROVIDER_SITE_OTHER): Payer: Medicare Other | Admitting: Podiatry

## 2014-08-24 ENCOUNTER — Ambulatory Visit (INDEPENDENT_AMBULATORY_CARE_PROVIDER_SITE_OTHER): Payer: Medicare Other

## 2014-08-24 DIAGNOSIS — Z23 Encounter for immunization: Secondary | ICD-10-CM

## 2014-08-24 DIAGNOSIS — L84 Corns and callosities: Secondary | ICD-10-CM

## 2014-08-24 DIAGNOSIS — M79609 Pain in unspecified limb: Secondary | ICD-10-CM | POA: Diagnosis not present

## 2014-08-24 DIAGNOSIS — B351 Tinea unguium: Secondary | ICD-10-CM | POA: Diagnosis not present

## 2014-08-24 DIAGNOSIS — M79673 Pain in unspecified foot: Secondary | ICD-10-CM

## 2014-08-24 NOTE — Progress Notes (Signed)
Subjective:  °  ° Patient ID: Tracy Dixon, female   DOB: 11/01/1941, 73 y.o.   MRN: [id number] ° °HPI patient presents with thick nailbeds 1-5 both feet that are painful and she cannot cut and keratotic lesion plantar aspect right foot that she cannot work with it and it is painful ° ° °Review of Systems ° °   °Objective:  ° Physical Exam ° Neurovascular status intact with muscle strength adequate and range of motion subtalar midtarsal joint within normal limits. Patient's that are thick yellow brittle and painful and there is a keratotic lesion plantar right that's painful when pressed °   °Assessment:  °   °Mycotic nail infection with keratotic lesion plantar right °   °Plan:  °   °Debride painful nailbeds 1-5 both feet with no iatrogenic bleeding noted and lesion plantar right with no iatrogenic bleeding noted °   ° ° °

## 2014-09-09 DIAGNOSIS — G5601 Carpal tunnel syndrome, right upper limb: Secondary | ICD-10-CM | POA: Diagnosis not present

## 2014-09-09 DIAGNOSIS — M542 Cervicalgia: Secondary | ICD-10-CM | POA: Diagnosis not present

## 2014-09-20 ENCOUNTER — Other Ambulatory Visit: Payer: Self-pay | Admitting: Family Medicine

## 2014-09-21 DIAGNOSIS — M542 Cervicalgia: Secondary | ICD-10-CM | POA: Diagnosis not present

## 2014-09-22 ENCOUNTER — Telehealth: Payer: Self-pay | Admitting: Family Medicine

## 2014-09-22 DIAGNOSIS — E7800 Pure hypercholesterolemia, unspecified: Secondary | ICD-10-CM

## 2014-09-22 DIAGNOSIS — M8580 Other specified disorders of bone density and structure, unspecified site: Secondary | ICD-10-CM

## 2014-09-22 NOTE — Telephone Encounter (Signed)
Message copied by [name] on Wed Sep 22, 2014 10:44 PM ------      Message from: Ellamae Sia      Created: Wed Sep 15, 2014  5:37 PM      Regarding: Lab orders for Thursday,10.29.15       Patient is scheduled for CPX labs, please order future labs, Thanks , Terri       ------

## 2014-09-23 ENCOUNTER — Other Ambulatory Visit (INDEPENDENT_AMBULATORY_CARE_PROVIDER_SITE_OTHER): Payer: Medicare Other

## 2014-09-23 DIAGNOSIS — E7800 Pure hypercholesterolemia, unspecified: Secondary | ICD-10-CM

## 2014-09-23 DIAGNOSIS — M8580 Other specified disorders of bone density and structure, unspecified site: Secondary | ICD-10-CM

## 2014-09-24 LAB — VITAMIN D 25 HYDROXY (VIT D DEFICIENCY, FRACTURES): VITD: 55.14 ng/mL (ref 30.00–100.00)

## 2014-09-24 LAB — COMPREHENSIVE METABOLIC PANEL
ALBUMIN: 3 g/dL — AB (ref 3.5–5.2)
ALT: 16 U/L (ref 0–35)
AST: 21 U/L (ref 0–37)
Alkaline Phosphatase: 60 U/L (ref 39–117)
BUN: 15 mg/dL (ref 6–23)
CO2: 33 mEq/L — ABNORMAL HIGH (ref 19–32)
Calcium: 9.1 mg/dL (ref 8.4–10.5)
Chloride: 100 mEq/L (ref 96–112)
Creatinine, Ser: 0.8 mg/dL (ref 0.4–1.2)
GFR: 86.53 mL/min (ref >60.00)
Glucose, Bld: 72 mg/dL (ref 70–99)
Potassium: 3.7 mEq/L (ref 3.5–5.1)
Sodium: 138 mEq/L (ref 135–145)
Total Bilirubin: 0.4 mg/dL (ref 0.2–1.2)
Total Protein: 7.5 g/dL (ref 6.0–8.3)

## 2014-09-24 LAB — LIPID PANEL
Cholesterol: 180 mg/dL (ref 0–200)
HDL: 41.8 mg/dL (ref >39.00)
LDL Cholesterol: 125 mg/dL — ABNORMAL HIGH (ref 0–99)
NONHDL: 138.2
TRIGLYCERIDES: 67 mg/dL (ref 0.0–149.0)
Total CHOL/HDL Ratio: 4
VLDL: 13.4 mg/dL (ref 0.0–40.0)

## 2014-09-30 ENCOUNTER — Encounter: Payer: Self-pay | Admitting: Family Medicine

## 2014-09-30 ENCOUNTER — Ambulatory Visit (INDEPENDENT_AMBULATORY_CARE_PROVIDER_SITE_OTHER): Payer: Medicare Other | Admitting: Family Medicine

## 2014-09-30 VITALS — BP 120/74 | HR 88 | Temp 97.6°F | Ht 61.5 in | Wt 173.8 lb

## 2014-09-30 DIAGNOSIS — E7800 Pure hypercholesterolemia, unspecified: Secondary | ICD-10-CM

## 2014-09-30 DIAGNOSIS — Z23 Encounter for immunization: Secondary | ICD-10-CM

## 2014-09-30 DIAGNOSIS — Z7189 Other specified counseling: Secondary | ICD-10-CM | POA: Insufficient documentation

## 2014-09-30 DIAGNOSIS — Z0000 Encounter for general adult medical examination without abnormal findings: Secondary | ICD-10-CM | POA: Diagnosis not present

## 2014-09-30 DIAGNOSIS — E8881 Metabolic syndrome: Secondary | ICD-10-CM

## 2014-09-30 NOTE — Progress Notes (Signed)
Pre visit review using our clinic review tool, if applicable. No additional management support is needed unless otherwise documented below in the visit note. 

## 2014-09-30 NOTE — Assessment & Plan Note (Signed)
Well controlled. Continue current medication.  

## 2014-09-30 NOTE — Assessment & Plan Note (Signed)
No HCPOA, no living will. Info given 09/2014

## 2014-09-30 NOTE — Progress Notes (Signed)
I have personally reviewed the Medicare Annual Wellness questionnaire and have noted  1. The patient's medical and social history  2. Their use of alcohol, tobacco or illicit drugs  3. Their current medications and supplements  4. The patient's functional ability including ADL's, fall risks, home safety risks and hearing or visual  impairment.  5. Diet and physical activities  6. Evidence for depression or mood disorders  The patients weight, height, BMI and visual acuity have been recorded in the chart  I have made referrals, counseling and provided education to the patient based review of the above and I have provided the pt with a written personalized care plan for preventive services.   Last year she had surgery for spinal stenosis, rods placed... Dr. Rolena Infante 06/10/2013 Only 60% effective. She has also had left carpal tunnel surgery. In 2014. Dr.Gramig.  She is having issues with carpal tunnel in other hand, right wrist. Has had cortisone shot. Dr. Amedeo Plenty She has also been having right shoulder pain.  Treated with prednisone, did not help. 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  MRI cervical spine: bulging disc in neck. Referred back to Dr Rolena Infante. Has appt early next week.  Hypertension: Well controlled on norvasc and HCTZ.  BP Readings from Last 3 Encounters:  09/30/14 120/74  05/04/14 124/66  02/19/14 141/84  Using medication without problems or lightheadedness:  Chest pain with exertion: None  Edema: Daily, wearing compression hose when it is not hot. Has varicose veins.  Short of breath: None  Average home BPs: Occ, normal at other MD visits. Other issues:   Elevated Cholesterol: Now at goal LDL <130 on no medication.  Lab Results  Component Value Date   CHOL 180 09/23/2014   HDL 41.80 09/23/2014   LDLCALC 125* 09/23/2014   TRIG 67.0 09/23/2014   CHOLHDL 4 09/23/2014  Exercise: Treadmill 3 times a week.  Reviewed all labs in detail.  History    Social History  . Marital Status: Married    Spouse Name: N/A    Number of Children: 4  . Years of Education: N/A   Occupational History  . 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 Smoking status: Former Smoker -- 15 years    Types: Cigarettes    Quit date: 06/02/1973  . Smokeless tobacco: Never Used     Comment: few cigs/day  . Alcohol Use: No  . Drug Use: No  . Sexual Activity: None   Other Topics Concern  . None   Social History Narrative   Regular exercise:  No   Diet:  Fruits and veggies, rare FF, no water    No living will, no HCPOA.    Review of Systems ROS: See HPI. Otherwise negative. No depression, no fatigue  Objective:   Physical Exam  Constitutional: Vital signs are normal. She appears well-developed and well-nourished. She is cooperative. Non-toxic appearance. She does not appear ill. No distress.  HENT:  Head: Normocephalic.  Right Ear: Hearing, tympanic membrane, external ear and ear canal normal.  Left Ear: Hearing, tympanic membrane, external ear and ear canal normal.  Nose: Nose normal.  Eyes: Conjunctivae, EOM and lids are normal. Pupils are equal, round, and reactive to light. No foreign bodies found.  Neck: Trachea normal and normal range of motion. Neck supple. Carotid bruit is not present. No mass and no thyromegaly present.  Cardiovascular: Normal rate, regular rhythm, S1 normal, S2 normal, normal heart sounds and intact  distal pulses. Exam reveals no gallop.  No murmur heard.  Pulmonary/Chest: Effort normal and breath sounds normal. No respiratory distress. She has no wheezes. She has no rhonchi. She has no rales.  Abdominal: Soft. Normal appearance and bowel sounds are normal. She exhibits no distension, no fluid wave, no abdominal bruit and no mass. There is no hepatosplenomegaly. No tenderness.. There is no rebound, no guarding and no CVA tenderness. No hernia.  Genitourinary: No breast swelling,  tenderness, discharge or bleeding.  Musculoskeletal:  No focal vertebral pain or pain over parapspinous muscles, neg SLR, neg Faber's  Lymphadenopathy:  She has no cervical adenopathy.  She has no axillary adenopathy.  Neurological: She is alert. She has normal strength. No cranial nerve deficit or sensory deficit.  Skin: Skin is warm, dry and intact. No rash noted.  Psychiatric: Her speech is normal and behavior is normal. Judgment normal. Her mood appears not anxious. Cognition and memory are normal. She does not exhibit a depressed mood.  Assessment & Plan:   Annual Medicare Wellness: The patient's preventative maintenance and recommended screening tests for an annual wellness exam were reviewed in full today.  Brought up to date unless services declined.  Counselled on the importance of diet, exercise, and its role in overall health and mortality.  The patient's FH and SH was reviewed, including their home life, tobacco status, and drug and alcohol status.   Up to date with TDap, zostavax,pneumovax vaccine. Given flu and prevnar today. Last colonoscopy 2008, next due in 10 years Mammogram: 08/2013.. Scheduled 10/25/2014 Has had BTL... No pap needed, no DVE .Marland KitchenMarland Kitchen No family history of uterine or ovarian cancer. No vaginal bleeding, no discharge, mild dryness.  Bone density: last in 2012 showed stable osteopenia, repeat in 5 years

## 2014-09-30 NOTE — Patient Instructions (Signed)
Work on [employer] , continue exercise and work on weight loss as able.

## 2014-09-30 NOTE — Assessment & Plan Note (Signed)
Well controlled. LDL at goal on med.

## 2014-10-04 DIAGNOSIS — M542 Cervicalgia: Secondary | ICD-10-CM | POA: Diagnosis not present

## 2014-10-19 ENCOUNTER — Ambulatory Visit (INDEPENDENT_AMBULATORY_CARE_PROVIDER_SITE_OTHER): Payer: Medicare Other | Admitting: Internal Medicine

## 2014-10-19 ENCOUNTER — Encounter: Payer: Self-pay | Admitting: Internal Medicine

## 2014-10-19 VITALS — BP 126/78 | HR 96 | Temp 98.0°F | Wt 176.0 lb

## 2014-10-19 DIAGNOSIS — N390 Urinary tract infection, site not specified: Secondary | ICD-10-CM | POA: Diagnosis not present

## 2014-10-19 DIAGNOSIS — R350 Frequency of micturition: Secondary | ICD-10-CM

## 2014-10-19 LAB — POCT URINALYSIS DIPSTICK
Glucose, UA: NEGATIVE
NITRITE UA: POSITIVE
PH UA: 6
Spec Grav, UA: 1.025
Urobilinogen, UA: 1

## 2014-10-19 MED ORDER — CIPROFLOXACIN HCL 250 MG PO TABS: 250.0000 mg | ORAL_TABLET | Freq: Two times a day (BID) | ORAL | Status: DC

## 2014-10-19 NOTE — Progress Notes (Signed)
HPI  Pt presents to the clinic today with c/o urinary symptoms that started 4 days ago. She endorses urgency and frequency. She denies burning, pain, fever, or chills. She has tried AZO with some relief of symptoms.    Review of Systems  Past Medical History  Diagnosis Date  . Allergy   . Asthma   . Hyperlipidemia   . Hypertension   . Arthritis   . Diverticulosis of colon   . Osteoarthritis   . Gallstones   . Acoustic neuroma     followed by [facility], dx in [place]  . Complication of anesthesia     stays sleepy    Family History  Problem Relation Age of Onset  . Cancer Mother     Lung CA  . Stroke Mother     CVA  . Cancer Father     Prostate  . Heart disease Neg Hx     History   Social History  . Marital Status: Married    Spouse Name: N/A    Number of Children: 4  . Years of Education: N/A   Occupational History  . 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 Smoking status: Former Smoker -- 15 years    Types: Cigarettes    Quit date: 06/02/1973  . Smokeless tobacco: Never Used     Comment: few cigs/day  . Alcohol Use: No  . Drug Use: No  . Sexual Activity: Not on file   Other Topics Concern  . Not on file   Social History Narrative   Regular exercise:  No   Diet:  Fruits and veggies, rare FF, no water    No living will, no HCPOA.    Allergies  Allergen Reactions  . Contrast Media [Iodinated Diagnostic Agents] Hives  . Crab [Shellfish Allergy] Hives    Constitutional: Denies fever, malaise, fatigue, headache or abrupt weight changes.   GU: Pt reports urgency and frequency. Denies burning sensation, blood in urine, odor or discharge. Skin: Denies redness, rashes, lesions or ulcercations.   No other specific complaints in a complete review of systems (except as listed in HPI above).    Objective:   Physical Exam  Pulse 96  Temp(Src) 98 F (36.7 C) (Oral)  Wt 176 lb (79.833 kg)  SpO2 97% Wt Readings from  Last 3 Encounters:  10/19/14 176 lb (79.833 kg)  09/30/14 173 lb 12 oz (78.812 kg)  09/22/13 166 lb (75.297 kg)    General: Appears her stated age, well developed, well nourished in NAD. Cardiovascular: Normal rate and rhythm. S1,S2 noted.  No murmur, rubs or gallops noted. No JVD or BLE edema. No carotid bruits noted. Pulmonary/Chest: Normal effort and positive vesicular breath sounds. No respiratory distress. No wheezes, rales or ronchi noted.  Abdomen: Soft and nontender. Normal bowel sounds, no bruits noted. No distention or masses noted. Liver, spleen and kidneys non palpable. Tender to palpation over the bladder area. No CVA tenderness.      Assessment & Plan:   Urgency, Frequency, Dysuria secondary to   Urinalysis:positive for leukocytes 3+, nitrates, and trace blood Will send for culture eRx sent if for Cipro 250mg  BID for 7 days OK to take AZO OTC Drink plenty of fluids  RTC as needed or if symptoms persist.  Daren Yeagle, Demetrius Charity, Student-NP

## 2014-10-19 NOTE — Addendum Note (Signed)
Addended by: Lurlean Nanny on: 10/19/2014 04:53 PM   Modules accepted: Orders

## 2014-10-19 NOTE — Patient Instructions (Signed)

## 2014-10-19 NOTE — Addendum Note (Signed)
Addended by: Jearld Fenton on: 10/19/2014 03:48 PM   Modules accepted: Miquel Dunn

## 2014-10-19 NOTE — Progress Notes (Addendum)
HPI  Pt presents to the clinic today with c/o urgency, frequency and dysuria. She reports this started 4 days ago. She denies fever, chills, nausea or low back pain. She has tried AZO with some relief.   Review of Systems  Past Medical History  Diagnosis Date  . Allergy   . Asthma   . Hyperlipidemia   . Hypertension   . Arthritis   . Diverticulosis of colon   . Osteoarthritis   . Gallstones   . Acoustic neuroma     followed by [facility], dx in [place]  . Complication of anesthesia     stays sleepy    Family History  Problem Relation Age of Onset  . Cancer Mother     Lung CA  . Stroke Mother     CVA  . Cancer Father     Prostate  . Heart disease Neg Hx     History   Social History  . Marital Status: Married    Spouse Name: N/A    Number of Children: 4  . Years of Education: N/A   Occupational History  . 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 Smoking status: Former Smoker -- 15 years    Types: Cigarettes    Quit date: 06/02/1973  . Smokeless tobacco: Never Used     Comment: few cigs/day  . Alcohol Use: No  . Drug Use: No  . Sexual Activity: Not on file   Other Topics Concern  . Not on file   Social History Narrative   Regular exercise:  No   Diet:  Fruits and veggies, rare FF, no water    No living will, no HCPOA.    Allergies  Allergen Reactions  . Contrast Media [Iodinated Diagnostic Agents] Hives  . Crab [Shellfish Allergy] Hives    Constitutional: Denies fever, malaise, fatigue, headache or abrupt weight changes.   GU: Pt reports urgency, frequency and pain with urination. Denies burning sensation, blood in urine, odor or discharge. Skin: Denies redness, rashes, lesions or ulcercations.   No other specific complaints in a complete review of systems (except as listed in HPI above).    Objective:   Physical Exam  BP 126/78 mmHg  Pulse 96  Temp(Src) 98 F (36.7 C) (Oral)  Wt 176 lb (79.833 kg)  SpO2  97% Wt Readings from Last 3 Encounters:  10/19/14 176 lb (79.833 kg)  09/30/14 173 lb 12 oz (78.812 kg)  09/22/13 166 lb (75.297 kg)    General: Appears her stated age, well developed, well nourished in NAD. Cardiovascular: Normal rate and rhythm. S1,S2 noted.  No murmur, rubs or gallops noted.  Pulmonary/Chest: Normal effort and positive vesicular breath sounds. No respiratory distress. No wheezes, rales or ronchi noted.  Abdomen: Soft and nontender. Normal bowel sounds, no bruits noted. No distention or masses noted. Liver, spleen and kidneys non palpable. Tender to palpation over the bladder area. No CVA tenderness.      Assessment & Plan:   Urgency, Frequency, Dysuria secondary to UTI  Urinalysis: 3+ leuks, pos nitirites, trace blood Will send urine culture eRx sent if for Cipro 250 mg BID x 7 days OK to continue AZO OTC Drink plenty of fluids  RTC as needed or if symptoms persist.

## 2014-10-19 NOTE — Progress Notes (Signed)
Pre visit review using our clinic review tool, if applicable. No additional management support is needed unless otherwise documented below in the visit note. 

## 2014-10-22 LAB — URINE CULTURE: Colony Count: >=100000

## 2014-11-02 ENCOUNTER — Ambulatory Visit: Payer: Self-pay | Admitting: Family Medicine

## 2014-11-02 DIAGNOSIS — Z1231 Encounter for screening mammogram for malignant neoplasm of breast: Secondary | ICD-10-CM | POA: Diagnosis not present

## 2014-11-03 ENCOUNTER — Encounter: Payer: Self-pay | Admitting: Family Medicine

## 2014-11-12 ENCOUNTER — Other Ambulatory Visit: Payer: Medicare Other

## 2014-11-15 DIAGNOSIS — M542 Cervicalgia: Secondary | ICD-10-CM | POA: Diagnosis not present

## 2014-12-03 ENCOUNTER — Ambulatory Visit (INDEPENDENT_AMBULATORY_CARE_PROVIDER_SITE_OTHER): Payer: Medicare Other | Admitting: Podiatry

## 2014-12-03 DIAGNOSIS — M79673 Pain in unspecified foot: Secondary | ICD-10-CM

## 2014-12-03 DIAGNOSIS — L84 Corns and callosities: Secondary | ICD-10-CM | POA: Diagnosis not present

## 2014-12-03 DIAGNOSIS — B351 Tinea unguium: Secondary | ICD-10-CM

## 2014-12-03 NOTE — Progress Notes (Signed)
Subjective:     Patient ID: Tracy Dixon, female   DOB: 12-Jan-1941, 74 y.o.   MRN: [id number]  HPI patient presents with thick nailbeds 1-5 both feet that are painful and she cannot cut and keratotic lesion plantar aspect right foot that she cannot work with it and it is painful   Review of Systems     Objective:   Physical Exam  Neurovascular status intact with muscle strength adequate and range of motion subtalar midtarsal joint within normal limits. Patient's that are thick yellow brittle and painful and there is a keratotic lesion plantar right that's painful when pressed    Assessment:     Mycotic nail infection with keratotic lesion plantar right    Plan:     Debride painful nailbeds 1-5 both feet with no iatrogenic bleeding noted and lesion plantar right with no iatrogenic bleeding noted

## 2015-01-24 ENCOUNTER — Encounter: Payer: Self-pay | Admitting: Family Medicine

## 2015-02-07 ENCOUNTER — Other Ambulatory Visit: Payer: Self-pay

## 2015-02-07 MED ORDER — HYDROCHLOROTHIAZIDE 25 MG PO TABS: ORAL_TABLET | ORAL | Status: DC

## 2015-02-07 MED ORDER — AMLODIPINE BESYLATE 10 MG PO TABS: ORAL_TABLET | ORAL | Status: DC

## 2015-02-07 NOTE — Telephone Encounter (Signed)
Pt request refill amlodipine and HCTZ to [facility]. Advised done.

## 2015-03-04 ENCOUNTER — Ambulatory Visit (INDEPENDENT_AMBULATORY_CARE_PROVIDER_SITE_OTHER): Payer: Medicare Other | Admitting: Podiatry

## 2015-03-04 DIAGNOSIS — M79673 Pain in unspecified foot: Secondary | ICD-10-CM | POA: Diagnosis not present

## 2015-03-04 DIAGNOSIS — L84 Corns and callosities: Secondary | ICD-10-CM

## 2015-03-04 DIAGNOSIS — B351 Tinea unguium: Secondary | ICD-10-CM

## 2015-03-04 NOTE — Progress Notes (Signed)
Subjective:     Patient ID: Tracy Dixon, female   DOB: 27-Sep-1941, 74 y.o.   MRN: [id number]  HPI patient presents with thick nailbeds 1-5 both feet that are painful and she cannot cut and keratotic lesion plantar aspect right foot that she cannot work with it and it is painful   Review of Systems     Objective:   Physical Exam  Neurovascular status intact with muscle strength adequate and range of motion subtalar midtarsal joint within normal limits. Patient's that are thick yellow brittle and painful and there is a keratotic lesion plantar right that's painful when pressed    Assessment:     Mycotic nail infection with keratotic lesion plantar right    Plan:     Debride painful nailbeds 1-5 both feet with no iatrogenic bleeding noted and lesion plantar right with no iatrogenic bleeding noted   Pad R sub 2 callus

## 2015-04-04 DIAGNOSIS — M65341 Trigger finger, right ring finger: Secondary | ICD-10-CM | POA: Diagnosis not present

## 2015-04-04 DIAGNOSIS — G5601 Carpal tunnel syndrome, right upper limb: Secondary | ICD-10-CM | POA: Diagnosis not present

## 2015-05-05 ENCOUNTER — Ambulatory Visit (INDEPENDENT_AMBULATORY_CARE_PROVIDER_SITE_OTHER): Payer: Medicare Other | Admitting: Podiatry

## 2015-05-05 DIAGNOSIS — M79673 Pain in unspecified foot: Secondary | ICD-10-CM | POA: Diagnosis not present

## 2015-05-05 DIAGNOSIS — B351 Tinea unguium: Secondary | ICD-10-CM | POA: Diagnosis not present

## 2015-05-05 NOTE — Progress Notes (Signed)
Subjective: 74 y.o.-year-old femal returns the office today for painful, elongated, thickened toenails which she is unable to trim herself. Denies any redness or drainage around the nails. Denies any acute changes since last appointment and no new complaints today. Denies any systemic complaints such as fevers, chills, nausea, vomiting.   Objective: AAO 3, NAD DP/PT pulses palpable, CRT less than 3 seconds Nails hypertrophic, dystrophic, elongated, brittle, discolored 10. There is tenderness overlying the nails 1-5 bilaterally. There is no surrounding erythema or drainage along the nail sites. Right submetatarsal 2 hyperkerotic lesion. Upon debridement, no underlying ulceration, drainage, or other signs of infection.  No open lesions or other pre-ulcerative lesions are identified. No other areas of tenderness bilateral lower extremities. No overlying edema, erythema, increased warmth. No pain with calf compression, swelling, warmth, erythema.  Assessment: Patient presents with symptomatic onychomycosis  Plan: -Treatment options including alternatives, risks, complications were discussed -Nails sharply debrided 10 without complication/bleeding. -Hyperkerotic lesion was sharply debrided x 1 without complications/bleeding.  -Discussed daily foot inspection. If there are any changes, to call the office immediately.  -Follow-up in 3 months or sooner if any problems are to arise. In the meantime, encouraged to call the office with any questions, concerns, changes symptoms.

## 2015-05-13 ENCOUNTER — Ambulatory Visit: Payer: Medicare Other

## 2015-05-19 DIAGNOSIS — M65341 Trigger finger, right ring finger: Secondary | ICD-10-CM | POA: Diagnosis not present

## 2015-05-19 DIAGNOSIS — G5601 Carpal tunnel syndrome, right upper limb: Secondary | ICD-10-CM | POA: Diagnosis not present

## 2015-06-02 DIAGNOSIS — M65341 Trigger finger, right ring finger: Secondary | ICD-10-CM | POA: Diagnosis not present

## 2015-06-02 DIAGNOSIS — G5601 Carpal tunnel syndrome, right upper limb: Secondary | ICD-10-CM | POA: Diagnosis not present

## 2015-06-09 DIAGNOSIS — Z4789 Encounter for other orthopedic aftercare: Secondary | ICD-10-CM | POA: Diagnosis not present

## 2015-06-14 DIAGNOSIS — G5601 Carpal tunnel syndrome, right upper limb: Secondary | ICD-10-CM | POA: Diagnosis not present

## 2015-06-30 DIAGNOSIS — Z4789 Encounter for other orthopedic aftercare: Secondary | ICD-10-CM | POA: Diagnosis not present

## 2015-07-07 ENCOUNTER — Encounter: Payer: Self-pay | Admitting: Family Medicine

## 2015-07-08 ENCOUNTER — Encounter: Payer: Self-pay | Admitting: Family Medicine

## 2015-07-08 ENCOUNTER — Ambulatory Visit (INDEPENDENT_AMBULATORY_CARE_PROVIDER_SITE_OTHER): Payer: Medicare Other | Admitting: Family Medicine

## 2015-07-08 VITALS — BP 124/78 | HR 90 | Temp 97.6°F | Wt 159.2 lb

## 2015-07-08 DIAGNOSIS — L299 Pruritus, unspecified: Secondary | ICD-10-CM | POA: Diagnosis not present

## 2015-07-08 NOTE — Assessment & Plan Note (Signed)
No clear rash. Likely allergic contact unknown. Treat with zyrtec. If not improving in 2 weeks consider lab eval.

## 2015-07-08 NOTE — Progress Notes (Signed)
Pre visit review using our clinic review tool, if applicable. No additional management support is needed unless otherwise documented below in the visit note. 

## 2015-07-08 NOTE — Progress Notes (Signed)
   Subjective:    Patient ID: Tracy Dixon, female    DOB: 13-Mar-1941, 74 y.o.   MRN: [id number]  HPI   74 year old  female with history of asthma and allergic rhinitis  presents with new onset itching in scalp, neck, ears and slightly on arms x 3 weeks.  No rash, feels bumps on neck.  Benadryl did not help, took zyrtec last night which helpd some.  No new meds, no known new exposures except she had hand surgery, started vit E oil 3 weeks ago. She has not used it in last 3 days. She has stopped all her regular shampoos. No recent coloring or straightening. No new foods.  No sick contacts.   She has history of hives from unknown oral exposure.  She feels well otherwise.   Review of Systems  Constitutional: Negative for fever and fatigue.  HENT: Negative for ear pain.   Eyes: Negative for pain.  Respiratory: Negative for chest tightness and shortness of breath.        No tounge swelling  Cardiovascular: Negative for chest pain, palpitations and leg swelling.  Gastrointestinal: Negative for abdominal pain.  Genitourinary: Negative for dysuria.       Objective:   Physical Exam  Constitutional: Vital signs are normal. She appears well-developed and well-nourished. She is cooperative.  Non-toxic appearance. She does not appear ill. No distress.  HENT:  Head: Normocephalic.  Right Ear: Hearing, tympanic membrane, external ear and ear canal normal. Tympanic membrane is not erythematous, not retracted and not bulging.  Left Ear: Hearing, tympanic membrane, external ear and ear canal normal. Tympanic membrane is not erythematous, not retracted and not bulging.  Nose: No mucosal edema or rhinorrhea. Right sinus exhibits no maxillary sinus tenderness and no frontal sinus tenderness. Left sinus exhibits no maxillary sinus tenderness and no frontal sinus tenderness.  Mouth/Throat: Uvula is midline, oropharynx is clear and moist and mucous membranes are normal.  Eyes: Conjunctivae, EOM  and lids are normal. Pupils are equal, round, and reactive to light. Lids are everted and swept, no foreign bodies found.  Neck: Trachea normal and normal range of motion. Neck supple. Carotid bruit is not present. No thyroid mass and no thyromegaly present.  Cardiovascular: Normal rate, regular rhythm, S1 normal, S2 normal, normal heart sounds, intact distal pulses and normal pulses.  Exam reveals no gallop and no friction rub.   No murmur heard. Pulmonary/Chest: Effort normal and breath sounds normal. No tachypnea. No respiratory distress. She has no decreased breath sounds. She has no wheezes. She has no rhonchi. She has no rales.  Abdominal: Soft. Normal appearance and bowel sounds are normal. There is no tenderness.  Neurological: She is alert.  Skin: Skin is warm, dry and intact. No rash noted.  No rash.  Psychiatric: Her speech is normal and behavior is normal. Judgment and thought content normal. Her mood appears not anxious. Cognition and memory are normal. She does not exhibit a depressed mood.          Assessment & Plan:

## 2015-07-08 NOTE — Patient Instructions (Signed)
Use zyrtec for allergic reaction.  If not improving in 2 weeks or new rash call.

## 2015-07-25 ENCOUNTER — Other Ambulatory Visit: Payer: Self-pay | Admitting: Family Medicine

## 2015-08-02 ENCOUNTER — Ambulatory Visit (INDEPENDENT_AMBULATORY_CARE_PROVIDER_SITE_OTHER): Payer: Medicare Other | Admitting: Podiatry

## 2015-08-02 DIAGNOSIS — B351 Tinea unguium: Secondary | ICD-10-CM

## 2015-08-02 DIAGNOSIS — L84 Corns and callosities: Secondary | ICD-10-CM

## 2015-08-02 DIAGNOSIS — M79673 Pain in unspecified foot: Secondary | ICD-10-CM

## 2015-08-02 NOTE — Progress Notes (Signed)
Subjective: 74 y.o.-year-old femal returns the office today for painful, elongated, thickened toenails which she is unable to trim herself. Denies any redness or drainage around the nails. Denies any acute changes since last appointment and no new complaints today. Denies any systemic complaints such as fevers, chills, nausea, vomiting.   Objective: AAO 3, NAD DP/PT pulses palpable, CRT less than 3 seconds Nails hypertrophic, dystrophic, elongated, brittle, discolored 10. There is tenderness overlying the nails 1-5 bilaterally. There is no surrounding erythema or drainage along the nail sites. Right submetatarsal 2 and lateral third toe the right foot hyperkerotic lesion. Upon debridement, no underlying ulceration, drainage, or other signs of infection.  No open lesions or other pre-ulcerative lesions are identified. No other areas of tenderness bilateral lower extremities. No overlying edema, erythema, increased warmth. No pain with calf compression, swelling, warmth, erythema.  Assessment: Patient presents with symptomatic onychomycosis; hyperkerotic lesions  Plan: -Treatment options including alternatives, risks, complications were discussed -Nails sharply debrided 10 without complication/bleeding. -Hyperkerotic lesion was sharply debrided x 2 without complications/bleeding.  -Discussed daily foot inspection. If there are any changes, to call the office immediately.  -Follow-up in 3 months or sooner if any problems are to arise. In the meantime, encouraged to call the office with any questions, concerns, changes symptoms.   Celesta Gentile, DPM

## 2015-08-03 ENCOUNTER — Ambulatory Visit: Payer: Medicare Other

## 2015-08-08 ENCOUNTER — Ambulatory Visit: Payer: Medicare Other

## 2015-08-11 ENCOUNTER — Ambulatory Visit: Payer: Medicare Other

## 2015-09-02 ENCOUNTER — Ambulatory Visit (INDEPENDENT_AMBULATORY_CARE_PROVIDER_SITE_OTHER): Payer: Medicare Other

## 2015-09-02 DIAGNOSIS — Z23 Encounter for immunization: Secondary | ICD-10-CM | POA: Diagnosis not present

## 2015-10-03 ENCOUNTER — Telehealth: Payer: Self-pay | Admitting: Family Medicine

## 2015-10-03 ENCOUNTER — Other Ambulatory Visit (INDEPENDENT_AMBULATORY_CARE_PROVIDER_SITE_OTHER): Payer: Medicare Other

## 2015-10-03 DIAGNOSIS — M8580 Other specified disorders of bone density and structure, unspecified site: Secondary | ICD-10-CM

## 2015-10-03 DIAGNOSIS — E7800 Pure hypercholesterolemia, unspecified: Secondary | ICD-10-CM | POA: Diagnosis not present

## 2015-10-03 LAB — COMPREHENSIVE METABOLIC PANEL
ALK PHOS: 57 U/L (ref 39–117)
ALT: 10 U/L (ref 0–35)
AST: 15 U/L (ref 0–37)
Albumin: 3.6 g/dL (ref 3.5–5.2)
BILIRUBIN TOTAL: 0.4 mg/dL (ref 0.2–1.2)
BUN: 14 mg/dL (ref 6–23)
CALCIUM: 9.7 mg/dL (ref 8.4–10.5)
CO2: 33 mEq/L — ABNORMAL HIGH (ref 19–32)
Chloride: 102 mEq/L (ref 96–112)
Creatinine, Ser: 0.71 mg/dL (ref 0.40–1.20)
GFR: 103.33 mL/min (ref >60.00)
GLUCOSE: 83 mg/dL (ref 70–99)
Potassium: 4.1 mEq/L (ref 3.5–5.1)
Sodium: 141 mEq/L (ref 135–145)
TOTAL PROTEIN: 7.7 g/dL (ref 6.0–8.3)

## 2015-10-03 LAB — LIPID PANEL
Cholesterol: 177 mg/dL (ref 0–200)
HDL: 44.3 mg/dL (ref >39.00)
LDL Cholesterol: 122 mg/dL — ABNORMAL HIGH (ref 0–99)
NONHDL: 132.51
Total CHOL/HDL Ratio: 4
Triglycerides: 53 mg/dL (ref 0.0–149.0)
VLDL: 10.6 mg/dL (ref 0.0–40.0)

## 2015-10-03 LAB — VITAMIN D 25 HYDROXY (VIT D DEFICIENCY, FRACTURES): VITD: 59.28 ng/mL (ref 30.00–100.00)

## 2015-10-03 NOTE — Telephone Encounter (Signed)
-----   Message from Terri J Walsh sent at 09/29/2015 10:55 AM EDT ----- Regarding: Lab orders for Monday, 11.7.16 Patient is scheduled for CPX labs, please order future labs, Thanks , Terri  

## 2015-10-04 ENCOUNTER — Ambulatory Visit (INDEPENDENT_AMBULATORY_CARE_PROVIDER_SITE_OTHER): Payer: Medicare Other | Admitting: Podiatry

## 2015-10-04 ENCOUNTER — Ambulatory Visit: Payer: Medicare Other | Admitting: Podiatry

## 2015-10-04 ENCOUNTER — Encounter: Payer: Self-pay | Admitting: Podiatry

## 2015-10-04 DIAGNOSIS — B351 Tinea unguium: Secondary | ICD-10-CM

## 2015-10-04 DIAGNOSIS — L84 Corns and callosities: Secondary | ICD-10-CM

## 2015-10-04 DIAGNOSIS — M79673 Pain in unspecified foot: Secondary | ICD-10-CM

## 2015-10-04 NOTE — Progress Notes (Signed)
Subjective: 74 y.o.-year-old female returns the office today for painful, elongated, thickened toenails which she is unable to trim herself. Denies any redness or drainage around the nails. She also states that she has painful calluses to her feet. Denies any acute changes since last appointment and no new complaints today. Denies any systemic complaints such as fevers, chills, nausea, vomiting.   Objective: AAO 3, NAD DP/PT pulses palpable, CRT less than 3 seconds Nails hypertrophic, dystrophic, elongated, brittle, discolored 10. There is tenderness overlying the nails 1-5 bilaterally. There is no surrounding erythema or drainage along the nail sites. Right submetatarsal 2 and lateral third toe the right foot hyperkerotic lesion. Small  Hyperkeratotic lesions on the left submetatarsal. Upon debridement, no underlying ulceration, drainage, or other signs of infection.  No open lesions or other pre-ulcerative lesions are identified. No other areas of tenderness bilateral lower extremities. No overlying edema, erythema, increased warmth. No pain with calf compression, swelling, warmth, erythema.  Assessment: Patient presents with symptomatic onychomycosis; hyperkerotic lesions  Plan: -Treatment options including alternatives, risks, complications were discussed -Nails sharply debrided 10 without complication/bleeding. -Hyperkerotic lesion was sharply debrided x 3 without complications/bleeding.  -Discussed daily foot inspection. If there are any changes, to call the office immediately.  -Follow-up in 3 months or sooner if any problems are to arise. In the meantime, encouraged to call the office with any questions, concerns, changes symptoms.   Celesta Gentile, DPM

## 2015-10-09 ENCOUNTER — Other Ambulatory Visit: Payer: Self-pay | Admitting: Family Medicine

## 2015-10-11 ENCOUNTER — Ambulatory Visit (INDEPENDENT_AMBULATORY_CARE_PROVIDER_SITE_OTHER): Payer: Medicare Other | Admitting: Family Medicine

## 2015-10-11 ENCOUNTER — Encounter: Payer: Self-pay | Admitting: Family Medicine

## 2015-10-11 VITALS — BP 118/68 | HR 85 | Temp 97.3°F | Ht 61.0 in | Wt 159.5 lb

## 2015-10-11 DIAGNOSIS — E7800 Pure hypercholesterolemia, unspecified: Secondary | ICD-10-CM

## 2015-10-11 DIAGNOSIS — Z0000 Encounter for general adult medical examination without abnormal findings: Secondary | ICD-10-CM | POA: Diagnosis not present

## 2015-10-11 DIAGNOSIS — I10 Essential (primary) hypertension: Secondary | ICD-10-CM

## 2015-10-11 DIAGNOSIS — Z7189 Other specified counseling: Secondary | ICD-10-CM

## 2015-10-11 DIAGNOSIS — J4520 Mild intermittent asthma, uncomplicated: Secondary | ICD-10-CM

## 2015-10-11 MED ORDER — ALBUTEROL SULFATE HFA 108 (90 BASE) MCG/ACT IN AERS: 1.0000 | INHALATION_SPRAY | Freq: Four times a day (QID) | RESPIRATORY_TRACT | Status: DC | PRN

## 2015-10-11 NOTE — Assessment & Plan Note (Signed)
Well controlled. Continue current medication.  

## 2015-10-11 NOTE — Assessment & Plan Note (Addendum)
Well controlled on no med. 

## 2015-10-11 NOTE — Assessment & Plan Note (Signed)
Well controlled, albuterol not used in years.. Expired. Needs refill.

## 2015-10-11 NOTE — Patient Instructions (Signed)
Schedule mammogram in 10/2015

## 2015-10-11 NOTE — Progress Notes (Signed)
Pre visit review using our clinic review tool, if applicable. No additional management support is needed unless otherwise documented below in the visit note. 

## 2015-10-11 NOTE — Progress Notes (Signed)
I have personally reviewed the Medicare Annual Wellness questionnaire and have noted 1. The patient's medical and social history 2. Their use of alcohol, tobacco or illicit drugs 3. Their current medications and supplements 4. The patient's functional ability including ADL's, fall risks, home safety risks and hearing or visual             impairment. 5. Diet and physical activities 6. Evidence for depression or mood disorders 7.         Updated provider list Cognitive evaluation was performed and recorded on pt medicare questionnaire form. The patients weight, height, BMI and visual acuity have been recorded in the chart  I have made referrals, counseling and provided education to the patient based review of the above and I have provided the pt with a written personalized care plan for preventive services.    2014 she had surgery for spinal stenosis, rods placed... Dr. Rolena Infante 06/10/2013 Only 60% effective. She has also had left carpal tunnel surgery. In 2014. Dr.Gramig. Stable control of back pain.  Hypertension: Well controlled on norvasc and HCTZ.  BP Readings from Last 3 Encounters:  10/11/15 118/68  07/08/15 124/78  10/19/14 126/78  Using medication without problems or lightheadedness:  none Chest pain with exertion: None  Edema: Daily, wearing compression hose when it is not hot. Has varicose veins.  Short of breath: None  Average home BPs: Occ, normal at other MD visits. Other issues:   Elevated Cholesterol: Now at goal LDL <130 on no medication.  Lab Results  Component Value Date   CHOL 177 10/03/2015   HDL 44.30 10/03/2015   LDLCALC 122* 10/03/2015   TRIG 53.0 10/03/2015   CHOLHDL 4 10/03/2015  Exercise: Treadmill when she can. She plans to start back 3 times a week. Diet: healthy eating. Low carb diet.  She has lost almost 20 lbs in last year. Wt Readings from Last 3 Encounters:  10/11/15 159 lb 8 oz (72.349 kg)  07/08/15 159 lb 4 oz (72.235 kg)  10/19/14  176 lb (79.833 kg)     Reviewed all labs in detail.  History   Social History  . Marital Status: Married    Spouse Name: N/A    Number of Children: 4  . Years of Education: N/A   Occupational History  . 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 Smoking status: Former Smoker -- 15 years    Types: Cigarettes    Quit date: 06/02/1973  . Smokeless tobacco: Never Used     Comment: few cigs/day  . Alcohol Use: No  . Drug Use: No  . Sexual Activity: None   Other Topics Concern  . None   Social History Narrative   Regular exercise: No   Diet: Fruits and veggies, rare FF, no water   No living will, no HCPOA.  Social History /Family History/Past Medical History reviewed and updated if needed.   Review of Systems ROS: Review of Systems  Constitutional: Negative for malaise/fatigue.  HENT: Negative for ear pain.   Eyes: Negative for pain.  Respiratory: Negative for cough and shortness of breath.   Cardiovascular: Negative for chest pain.  Gastrointestinal: Negative for abdominal pain.  Genitourinary: Negative for dysuria.  Skin: Negative for rash.   Denies depression. Objective:   Physical Exam  Constitutional: Vital signs are normal. She appears well-developed and well-nourished. She is cooperative. Non-toxic appearance. She does not appear ill. No distress.  HENT:  Head: Normocephalic.  Right Ear: Hearing, tympanic membrane, external ear and ear canal normal.  Left Ear: Hearing, tympanic membrane, external ear and ear canal normal.  Nose: Nose normal.  Eyes: Conjunctivae, EOM and lids are normal. Pupils are equal, round, and reactive to light. No foreign bodies found.  Neck: Trachea normal and normal range of motion. Neck supple. Carotid bruit is not present. No mass and no thyromegaly present.  Cardiovascular: Normal rate, regular rhythm, S1 normal, S2  normal, normal heart sounds and intact distal pulses. Exam reveals no gallop.  No murmur heard.  Pulmonary/Chest: Effort normal and breath sounds normal. No respiratory distress. She has no wheezes. She has no rhonchi. She has no rales.  Abdominal: Soft. Normal appearance and bowel sounds are normal. She exhibits no distension, no fluid wave, no abdominal bruit and no mass. There is no hepatosplenomegaly. No tenderness.. There is no rebound, no guarding and no CVA tenderness. No hernia.  Genitourinary: No breast swelling, tenderness, discharge or bleeding.  Musculoskeletal:  No focal vertebral pain or pain over parapspinous muscles, neg SLR, neg Faber's  Lymphadenopathy:  She has no cervical adenopathy.  She has no axillary adenopathy.  Neurological: She is alert. She has normal strength. No cranial nerve deficit or sensory deficit.  Skin: Skin is warm, dry and intact. No rash noted.  Psychiatric: Her speech is normal and behavior is normal. Judgment normal. Her mood appears not anxious. Cognition and memory are normal. She does not exhibit a depressed mood.  Assessment & Plan:   Annual Medicare Wellness: The patient's preventative maintenance and recommended screening tests for an annual wellness exam were reviewed in full today.  Brought up to date unless services declined.  Counselled on the importance of diet, exercise, and its role in overall health and mortality.  The patient's FH and SH was reviewed, including their home life, tobacco status, and drug and alcohol status.   Up to date with TDap, zostavax,pneumovax  flu and Prevnar. Last colonoscopy 2008, next due in 10 years Mammogram: 10/2014 nml Has had BTL... No pap needed, no DVE .Marland KitchenMarland Kitchen No family history of uterine or ovarian cancer. No vaginal bleeding, no discharge, mild dryness.  Bone density: last in 2012 showed stable osteopenia, repeat in 5 years Nonsmoker        Body mass index is 30.15 kg/(m^2).

## 2015-10-11 NOTE — Assessment & Plan Note (Signed)
She has not set up yet. She is looking into setting up. Given another info packet today.

## 2015-10-13 ENCOUNTER — Other Ambulatory Visit: Payer: Self-pay | Admitting: *Deleted

## 2015-10-13 MED ORDER — ALBUTEROL SULFATE HFA 108 (90 BASE) MCG/ACT IN AERS: 1.0000 | INHALATION_SPRAY | Freq: Four times a day (QID) | RESPIRATORY_TRACT | Status: DC | PRN

## 2015-10-13 NOTE — Telephone Encounter (Signed)
Received fax from [facility] asking for a 90 day supply on the albuterol inhaler.  Rx sent as requested.

## 2015-10-26 ENCOUNTER — Other Ambulatory Visit: Payer: Self-pay | Admitting: Family Medicine

## 2015-10-26 DIAGNOSIS — Z1231 Encounter for screening mammogram for malignant neoplasm of breast: Secondary | ICD-10-CM

## 2015-11-04 ENCOUNTER — Other Ambulatory Visit: Payer: Self-pay | Admitting: Family Medicine

## 2015-11-04 ENCOUNTER — Ambulatory Visit
Admission: RE | Admit: 2015-11-04 | Discharge: 2015-11-04 | Disposition: A | Discharge status: Home / Self Care | Payer: Medicare Other | Source: Ambulatory Visit | Attending: Family Medicine | Admitting: Family Medicine

## 2015-11-04 DIAGNOSIS — Z1231 Encounter for screening mammogram for malignant neoplasm of breast: Secondary | ICD-10-CM

## 2016-01-03 ENCOUNTER — Encounter: Payer: Self-pay | Admitting: Podiatry

## 2016-01-03 ENCOUNTER — Ambulatory Visit (INDEPENDENT_AMBULATORY_CARE_PROVIDER_SITE_OTHER): Payer: Medicare Other | Admitting: Podiatry

## 2016-01-03 DIAGNOSIS — L84 Corns and callosities: Secondary | ICD-10-CM | POA: Diagnosis not present

## 2016-01-03 DIAGNOSIS — B351 Tinea unguium: Secondary | ICD-10-CM | POA: Diagnosis not present

## 2016-01-03 DIAGNOSIS — M79673 Pain in unspecified foot: Secondary | ICD-10-CM

## 2016-01-06 NOTE — Progress Notes (Signed)
Patient ID: Tracy Dixon, female   DOB: 11-13-1941, 75 y.o.   MRN: [id number]  Subjective: 75 y.o.-year-old female returns the office today for painful, elongated, thickened toenails which she is unable to trim herself. Denies any redness or drainage around the nails. She also states that she has painful calluses to her feet but denies any open sores or drainage. Denies any acute changes since last appointment and no new complaints today. Denies any systemic complaints such as fevers, chills, nausea, vomiting.   Objective: AAO 3, NAD DP/PT pulses palpable, CRT less than 3 seconds Nails hypertrophic, dystrophic, elongated, brittle, discolored 10. There is tenderness overlying the nails 1-5 bilaterally. There is no surrounding erythema or drainage along the nail sites. Right submetatarsal 2 and lateral third toe the right foot hyperkerotic lesion. Small  Hyperkeratotic lesions on the left submetatarsal. Upon debridement there is no underlying ulceration, drainage, or other signs of infection.  No other open lesions or other pre-ulcerative lesions are identified. No other areas of tenderness bilateral lower extremities. No overlying edema, erythema, increased warmth. No pain with calf compression, swelling, warmth, erythema.  Assessment: Patient presents with symptomatic onychomycosis; hyperkerotic lesions  Plan: -Treatment options including alternatives, risks, complications were discussed -Nails sharply debrided 10 without complication/bleeding. -Hyperkerotic lesion was sharply debrided x 3 without complications/bleeding.  -Discussed daily foot inspection. If there are any changes, to call the office immediately.  -Follow-up in 3 months or sooner if any problems are to arise. In the meantime, encouraged to call the office with any questions, concerns, changes symptoms.   Celesta Gentile, DPM

## 2016-01-10 ENCOUNTER — Encounter: Payer: Self-pay | Admitting: Primary Care

## 2016-01-10 ENCOUNTER — Ambulatory Visit (INDEPENDENT_AMBULATORY_CARE_PROVIDER_SITE_OTHER): Payer: Medicare Other | Admitting: Primary Care

## 2016-01-10 VITALS — BP 124/76 | HR 64 | Temp 97.9°F | Ht 61.0 in | Wt 161.1 lb

## 2016-01-10 DIAGNOSIS — N390 Urinary tract infection, site not specified: Secondary | ICD-10-CM

## 2016-01-10 DIAGNOSIS — R319 Hematuria, unspecified: Secondary | ICD-10-CM

## 2016-01-10 LAB — POC URINALSYSI DIPSTICK (AUTOMATED)
BILIRUBIN UA: NEGATIVE
Glucose, UA: NEGATIVE
KETONES UA: NEGATIVE
NITRITE UA: POSITIVE
Protein, UA: NEGATIVE
Spec Grav, UA: >=1.03
UROBILINOGEN UA: NEGATIVE
pH, UA: 6

## 2016-01-10 MED ORDER — SULFAMETHOXAZOLE-TRIMETHOPRIM 800-160 MG PO TABS: 1.0000 | ORAL_TABLET | Freq: Two times a day (BID) | ORAL | Status: DC

## 2016-01-10 NOTE — Progress Notes (Signed)
Subjective:    Patient ID: Tracy Dixon, female    DOB: 1941/08/28, 75 y.o.   MRN: [id number]  HPI  Tracy Dixon is a 75 year old female who presents today with a chief complaint of urinary frequency. She also reports dysuria and pelvic pressure. Her symptoms have been present since last Thursday. Denies hematuria, fevers, vaginal discharge, vaginal itching, nausea. She's taken cranberry AZO with some improvement temporarily. Overall her symptoms are about the same.   Review of Systems  Constitutional: Negative for fever and chills.  Genitourinary: Positive for dysuria, frequency and pelvic pain. Negative for hematuria, flank pain and vaginal discharge.       Past Medical History  Diagnosis Date  . Allergy   . Asthma   . Hyperlipidemia   . Hypertension   . Arthritis   . Diverticulosis of colon   . Osteoarthritis   . Gallstones   . 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 Complication of anesthesia     stays sleepy    Social History   Social History  . Marital Status: Married    Spouse Name: N/A  . Number of Children: 4  . Years of Education: N/A   Occupational History  . 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 Smoking status: Former Smoker -- 15 years    Types: Cigarettes    Quit date: 06/02/1973  . Smokeless tobacco: Never Used     Comment: few cigs/day  . Alcohol Use: No  . Drug Use: No  . Sexual Activity: Not on file   Other Topics Concern  . Not on file   Social History Narrative   Regular exercise:  No   Diet:  Fruits and veggies, rare FF, no water    No living will, no HCPOA.    Past Surgical History  Procedure Laterality Date  . Cholecystectomy  1982  . Mri brain  2007    acoustic neuroma (check yearly with MRI)  . Replacement total knee bilateral  2005  . Bilateral tubal ligation  2005  . Cesarean section  2005  . Tonsillectomy  2005  . Pft's  12/2006    (-) FEV1/FVC 79%, FEV1  84%  . Carotid doppler  07/2006    Neg  . Tubal ligation    . Decompressive lumbar laminectomy level 3  06/10/2013    Procedure: DECOMPRESSIVE LUMBAR LAMINECTOMY LEVEL 3;  Surgeon: Melina Schools, MD;  Location: [place];  Service: Orthopedics;;  lumbar three to five decompression    Family History  Problem Relation Age of Onset  . Cancer Mother     Lung CA  . Stroke Mother     CVA  . Cancer Father     Prostate  . Heart disease Neg Hx     Allergies  Allergen Reactions  . Contrast Media [Iodinated Diagnostic Agents] Hives  . Crab [Shellfish Allergy] Hives    Current Outpatient Prescriptions on File Prior to Visit  Medication Sig Dispense Refill  . albuterol (PROVENTIL HFA;VENTOLIN HFA) 108 (90 BASE) MCG/ACT inhaler Inhale 1-2 puffs into the lungs every 6 (six) hours as needed for shortness of breath (for use when in contact with dogs or cats.). 3 Inhaler 0  . amLODipine (NORVASC) 10 MG tablet Take 1 tablet by mouth  daily 90 tablet 0  . aspirin 81 MG tablet Take 81 mg by mouth daily.      Marland Kitchen  cetirizine (ZYRTEC) 10 MG tablet Take 10 mg by mouth daily as needed for allergies. Reported on 01/10/2016    . cholecalciferol (VITAMIN D) 1000 UNITS tablet Take 1,000 Units by mouth daily.    . hydrochlorothiazide (HYDRODIURIL) 25 MG tablet Take 1 tablet by mouth  daily 90 tablet 0   No current facility-administered medications on file prior to visit.    BP 124/76 mmHg  Pulse 64  Temp(Src) 97.9 F (36.6 C) (Oral)  Ht 5\' 1"  (1.549 m)  Wt 161 lb 1.9 oz (73.084 kg)  BMI 30.46 kg/m2  SpO2 96%    Objective:   Physical Exam  Constitutional: She appears well-nourished.  Cardiovascular: Normal rate and regular rhythm.   Pulmonary/Chest: Effort normal and breath sounds normal.  Abdominal: There is no CVA tenderness.  Skin: Skin is warm and dry.          Assessment & Plan:  Urinary Tract Infection:  Dysuria, frequency, pelvic pressure x 5 days. Temporary improvement with AZO. Exam  unremarkable. No CVA tenderness. UA: 3+ leuks, positive nitirites, 1+ blood. Culture sent. RX for Bactrim course. She declines pyridium. Increase fluids and rest. Return precautions provided.

## 2016-01-10 NOTE — Patient Instructions (Signed)
Start Bactrim DS (sulfamethoxazole/trimethoprim) tablets for urinary tract infection. Take 1 tablet by mouth twice daily for 5 days.  Increase consumption of water and rest. Continue AZO as needed for symptoms.  Please notify me if you've experienced no improvement in 3-4 days.  It was a pleasure meeting you!  Urinary Tract Infection Urinary tract infections (UTIs) can develop anywhere along your urinary tract. Your urinary tract is your body's drainage system for removing wastes and extra water. Your urinary tract includes two kidneys, two ureters, a bladder, and a urethra. Your kidneys are a pair of bean-shaped organs. Each kidney is about the size of your fist. They are located below your ribs, one on each side of your spine. CAUSES Infections are caused by microbes, which are microscopic organisms, including fungi, viruses, and bacteria. These organisms are so small that they can only be seen through a microscope. Bacteria are the microbes that most commonly cause UTIs. SYMPTOMS  Symptoms of UTIs may vary by age and gender of the patient and by the location of the infection. Symptoms in young women typically include a frequent and intense urge to urinate and a painful, burning feeling in the bladder or urethra during urination. Older women and men are more likely to be tired, shaky, and weak and have muscle aches and abdominal pain. A fever may mean the infection is in your kidneys. Other symptoms of a kidney infection include pain in your back or sides below the ribs, nausea, and vomiting. DIAGNOSIS To diagnose a UTI, your caregiver will ask you about your symptoms. Your caregiver will also ask you to provide a urine sample. The urine sample will be tested for bacteria and white blood cells. White blood cells are made by your body to help fight infection. TREATMENT  Typically, UTIs can be treated with medication. Because most UTIs are caused by a bacterial infection, they usually can be treated  with the use of antibiotics. The choice of antibiotic and length of treatment depend on your symptoms and the type of bacteria causing your infection. HOME CARE INSTRUCTIONS  If you were prescribed antibiotics, take them exactly as your caregiver instructs you. Finish the medication even if you feel better after you have only taken some of the medication.  Drink enough water and fluids to keep your urine clear or pale yellow.  Avoid caffeine, tea, and carbonated beverages. They tend to irritate your bladder.  Empty your bladder often. Avoid holding urine for long periods of time.  Empty your bladder before and after sexual intercourse.  After a bowel movement, women should cleanse from front to back. Use each tissue only once. SEEK MEDICAL CARE IF:   You have back pain.  You develop a fever.  Your symptoms do not begin to resolve within 3 days. SEEK IMMEDIATE MEDICAL CARE IF:   You have severe back pain or lower abdominal pain.  You develop chills.  You have nausea or vomiting.  You have continued burning or discomfort with urination. MAKE SURE YOU:   Understand these instructions.  Will watch your condition.  Will get help right away if you are not doing well or get worse.   This information is not intended to replace advice given to you by your health care provider. Make sure you discuss any questions you have with your health care provider.   Document Released: 08/22/2005 Document Revised: 08/03/2015 Document Reviewed: 12/21/2011 Elsevier Interactive Patient Education [employer].

## 2016-01-10 NOTE — Progress Notes (Signed)
Pre visit review using our clinic review tool, if applicable. No additional management support is needed unless otherwise documented below in the visit note. 

## 2016-01-13 LAB — URINE CULTURE

## 2016-02-06 ENCOUNTER — Other Ambulatory Visit: Payer: Self-pay | Admitting: Family Medicine

## 2016-03-13 ENCOUNTER — Ambulatory Visit: Payer: Medicare Other | Admitting: Podiatry

## 2016-03-19 ENCOUNTER — Encounter: Payer: Self-pay | Admitting: Family Medicine

## 2016-03-20 ENCOUNTER — Ambulatory Visit (INDEPENDENT_AMBULATORY_CARE_PROVIDER_SITE_OTHER): Payer: Medicare Other | Admitting: Podiatry

## 2016-03-20 ENCOUNTER — Ambulatory Visit (INDEPENDENT_AMBULATORY_CARE_PROVIDER_SITE_OTHER): Payer: Medicare Other | Admitting: Adult Health

## 2016-03-20 ENCOUNTER — Ambulatory Visit: Payer: Medicare Other | Admitting: Podiatry

## 2016-03-20 ENCOUNTER — Ambulatory Visit (INDEPENDENT_AMBULATORY_CARE_PROVIDER_SITE_OTHER)
Admission: RE | Admit: 2016-03-20 | Discharge: 2016-03-20 | Disposition: A | Discharge status: Home / Self Care | Payer: Medicare Other | Source: Ambulatory Visit | Attending: Adult Health | Admitting: Adult Health

## 2016-03-20 ENCOUNTER — Encounter: Payer: Self-pay | Admitting: Podiatry

## 2016-03-20 ENCOUNTER — Telehealth: Payer: Self-pay | Admitting: Family Medicine

## 2016-03-20 DIAGNOSIS — K6289 Other specified diseases of anus and rectum: Secondary | ICD-10-CM | POA: Diagnosis not present

## 2016-03-20 DIAGNOSIS — B351 Tinea unguium: Secondary | ICD-10-CM

## 2016-03-20 DIAGNOSIS — M79673 Pain in unspecified foot: Secondary | ICD-10-CM | POA: Diagnosis not present

## 2016-03-20 DIAGNOSIS — L84 Corns and callosities: Secondary | ICD-10-CM

## 2016-03-20 DIAGNOSIS — R1084 Generalized abdominal pain: Secondary | ICD-10-CM

## 2016-03-20 DIAGNOSIS — R509 Fever, unspecified: Secondary | ICD-10-CM | POA: Diagnosis not present

## 2016-03-20 LAB — BASIC METABOLIC PANEL
BUN: 12 mg/dL (ref 6–23)
CHLORIDE: 98 meq/L (ref 96–112)
CO2: 30 mEq/L (ref 19–32)
CREATININE: 0.62 mg/dL (ref 0.40–1.20)
Calcium: 9.5 mg/dL (ref 8.4–10.5)
GFR: 120.67 mL/min (ref >60.00)
Glucose, Bld: 82 mg/dL (ref 70–99)
Potassium: 3.6 mEq/L (ref 3.5–5.1)
SODIUM: 137 meq/L (ref 135–145)

## 2016-03-20 LAB — CBC WITH DIFFERENTIAL/PLATELET
BASOS ABS: 0.1 10*3/uL (ref 0.0–0.1)
Basophils Relative: 0.6 % (ref 0.0–3.0)
EOS ABS: 0.1 10*3/uL (ref 0.0–0.7)
Eosinophils Relative: 0.9 % (ref 0.0–5.0)
HCT: 39.3 % (ref 36.0–46.0)
Hemoglobin: 12.9 g/dL (ref 12.0–15.0)
LYMPHS ABS: 3 10*3/uL (ref 0.7–4.0)
Lymphocytes Relative: 33.8 % (ref 12.0–46.0)
MCHC: 32.8 g/dL (ref 30.0–36.0)
MCV: 78.1 fl (ref 78.0–100.0)
MONO ABS: 0.7 10*3/uL (ref 0.1–1.0)
Monocytes Relative: 7.4 % (ref 3.0–12.0)
NEUTROS ABS: 5 10*3/uL (ref 1.4–7.7)
NEUTROS PCT: 57.3 % (ref 43.0–77.0)
PLATELETS: 237 10*3/uL (ref 150.0–400.0)
RBC: 5.03 Mil/uL (ref 3.87–5.11)
RDW: 16.7 % — ABNORMAL HIGH (ref 11.5–15.5)
WBC: 8.8 10*3/uL (ref 4.0–10.5)

## 2016-03-20 LAB — POCT URINALYSIS DIPSTICK
BILIRUBIN UA: NEGATIVE
Glucose, UA: NEGATIVE
KETONES UA: NEGATIVE
Leukocytes, UA: NEGATIVE
Nitrite, UA: NEGATIVE
PH UA: 6
Protein, UA: NEGATIVE
RBC UA: NEGATIVE
SPEC GRAV UA: 1.02
Urobilinogen, UA: 0.2

## 2016-03-20 NOTE — Telephone Encounter (Signed)
Patient Name: Tracy Dixon  DOB: 01/12/1941    Initial Comment Caller states been running fever for a couple days, having pain in abd area   Nurse Assessment  Nurse: Mallie Mussel, RN, Alveta Heimlich Date/Time Eilene Ghazi Time): 03/20/2016 9:45:03 AM  Confirm and document reason for call. If symptomatic, describe symptoms. You must click the next button to save text entered. ---Caller states that she has lower abdominal pain which radiates to her back. 4-5 on 0-10 scale and she states she has a high tolerance for pain. The pain is constant. She states that its less pain when she lies down. She has had a fever which began 2 days ago. Denies fever at present. The fever is mainly in the evenings. She denies vomiting. She denies burning/pain with urination. Last BM was this morning.  Has the patient traveled out of the country within the last 30 days? ---No  Does the patient have any new or worsening symptoms? ---Yes  Will a triage be completed? ---Yes  Related visit to physician within the last 2 weeks? ---No  Does the PT have any chronic conditions? (i.e. diabetes, asthma, etc.) ---Yes  List chronic conditions. ---HTN  Is this a behavioral health or substance abuse call? ---No     Guidelines    Guideline Title Affirmed Question Affirmed Notes  Abdominal Pain - Female [1] MILD-MODERATE pain AND [2] constant AND [3] present > 2 hours    Final Disposition User   See Physician within 4 Hours (or PCP triage) Mallie Mussel, RN, Alveta Heimlich    Comments  No appointments for Dr. Diona Browner, and no other appointments at [facility]. I checked [facility] and the only ones available were at 4:00, too far from triage outcome. She wanted to be seen sooner that that. I checked [facility] and was able to schedule her an appointment with Sallee Provencal FNP for 11:30 today with a 30 minute appointment per instructions.   Referrals  REFERRED TO PCP OFFICE   Disagree/Comply: Comply

## 2016-03-20 NOTE — Telephone Encounter (Signed)
Pt has appt 03/20/16 at 11:30 with Sallee Provencal NP.

## 2016-03-20 NOTE — Patient Instructions (Signed)
It was great meeting you today!  I am unsure of what is causing your pain. I am going to try and get a cat scan of the abdomen for you today, so that we can find out.   Do not drink the contrast until we know for sure when it will be.   Follow up with your PCP as needed.

## 2016-03-20 NOTE — Progress Notes (Signed)
Subjective:    Patient ID: Tracy Dixon, female    DOB: Mar 01, 1941, 75 y.o.   MRN: [id number]  HPI   75 year old female who presents to the office today with vague complaints. She reports that for the last 3-4 days she has a " dull pressure like pain " around my rectum, but not necessarily in my rectum". She continues " it almost feels like I want to push a baby out."   Over the last two nights she has spiked a fever up to 101F.   She denies constipation, pain with defecation, or blood in stool.   She denies any nausea or vomiting.   She denies any UTI type symptoms.   Does not have any vaginal pain or discharge.   The pain is worse when she goes from a laying or sitting position to a standing position. She does have pain with twisting.   Review of Systems  Constitutional: Positive for fever and fatigue.  Respiratory: Negative.   Cardiovascular: Negative.   Gastrointestinal: Positive for abdominal pain and rectal pain. Negative for nausea, vomiting, diarrhea, constipation, blood in stool, abdominal distention and anal bleeding.  Genitourinary: Negative.   Musculoskeletal: Positive for back pain. Negative for myalgias, joint swelling and gait problem.  Neurological: Negative.   All other systems reviewed and are negative.  Past Medical History  Diagnosis Date  . Allergy   . Asthma   . Hyperlipidemia   . Hypertension   . Arthritis   . Diverticulosis of colon   . Osteoarthritis   . Gallstones   . 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 Complication of anesthesia     stays sleepy    Social History   Social History  . Marital Status: Married    Spouse Name: N/A  . Number of Children: 4  . Years of Education: N/A   Occupational History  . 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 Smoking status: Former Smoker -- 15 years    Types: Cigarettes    Quit date: 06/02/1973  . Smokeless tobacco: Never Used       Comment: few cigs/day  . Alcohol Use: No  . Drug Use: No  . Sexual Activity: Not on file   Other Topics Concern  . Not on file   Social History Narrative   Regular exercise:  No   Diet:  Fruits and veggies, rare FF, no water    No living will, no HCPOA.    Past Surgical History  Procedure Laterality Date  . Cholecystectomy  1982  . Mri brain  2007    acoustic neuroma (check yearly with MRI)  . Replacement total knee bilateral  2005  . Bilateral tubal ligation  2005  . Cesarean section  2005  . Tonsillectomy  2005  . Pft's  12/2006    (-) FEV1/FVC 79%, FEV1 84%  . Carotid doppler  07/2006    Neg  . Tubal ligation    . Decompressive lumbar laminectomy level 3  06/10/2013    Procedure: DECOMPRESSIVE LUMBAR LAMINECTOMY LEVEL 3;  Surgeon: Melina Schools, MD;  Location: [place];  Service: Orthopedics;;  lumbar three to five decompression    Family History  Problem Relation Age of Onset  . Cancer Mother     Lung CA  . Stroke Mother     CVA  . Cancer Father  Prostate  . Heart disease Neg Hx     Allergies  Allergen Reactions  . Contrast Media [Iodinated Diagnostic Agents] Hives  . Crab [Shellfish Allergy] Hives    Current Outpatient Prescriptions on File Prior to Visit  Medication Sig Dispense Refill  . albuterol (PROVENTIL HFA;VENTOLIN HFA) 108 (90 BASE) MCG/ACT inhaler Inhale 1-2 puffs into the lungs every 6 (six) hours as needed for shortness of breath (for use when in contact with dogs or cats.). 3 Inhaler 0  . amLODipine (NORVASC) 10 MG tablet Take 1 tablet by mouth  daily 90 tablet 1  . aspirin 81 MG tablet Take 81 mg by mouth daily.      . cetirizine (ZYRTEC) 10 MG tablet Take 10 mg by mouth daily as needed for allergies. Reported on 01/10/2016    . cholecalciferol (VITAMIN D) 1000 UNITS tablet Take 1,000 Units by mouth daily.    . hydrochlorothiazide (HYDRODIURIL) 25 MG tablet Take 1 tablet by mouth  daily 90 tablet 1   No current facility-administered  medications on file prior to visit.    BP 120/76 mmHg  Temp(Src) 97.8 F (36.6 C) (Oral)  Wt 162 lb 8 oz (73.71 kg)       Objective:   Physical Exam  Constitutional: She is oriented to person, place, and time. She appears well-developed and well-nourished. No distress.  Cardiovascular: Normal rate, regular rhythm, normal heart sounds and intact distal pulses.  Exam reveals no gallop and no friction rub.   No murmur heard. Pulmonary/Chest: Effort normal and breath sounds normal. No respiratory distress. She has no wheezes. She has no rales. She exhibits no tenderness.  Abdominal: Soft. Bowel sounds are normal. She exhibits no distension, no abdominal bruit, no ascites and no mass. There is no splenomegaly or hepatomegaly. There is tenderness in the right lower quadrant, epigastric area, periumbilical area, left upper quadrant and left lower quadrant. There is no rebound, no guarding, no CVA tenderness, no tenderness at McBurney's point and negative Murphy's sign. No hernia. Hernia confirmed negative in the ventral area.  Genitourinary: Rectum normal. Rectal exam shows no external hemorrhoid, no internal hemorrhoid, no fissure, no mass, no tenderness and anal tone normal. Guaiac negative stool.  Neurological: She is alert and oriented to person, place, and time.  Skin: Skin is warm and dry. No rash noted. She is not diaphoretic. No erythema. No pallor.  Psychiatric: She has a normal mood and affect. Her behavior is normal. Judgment and thought content normal.  Nursing note and vitals reviewed.      Assessment & Plan:  1. Generalized abdominal pain - POCT Urinalysis Dipstick - CBC with Differential/Platelet - Basic metabolic panel - CT Abdomen Pelvis W Contrast; Future  2. Rectal pain - Differentials include: Mass, anal fissure, anorectal abscess.  - POCT Urinalysis Dipstick - CBC with Differential/Platelet - Basic metabolic panel - CT Abdomen Pelvis W Contrast; Future  Dorothyann Peng, NP

## 2016-03-20 NOTE — Progress Notes (Signed)
Patient ID: Tracy Dixon, female   DOB: [birth year], 75 y.o.   MRN: [id number]   Subjective: 75 y.o.-year-old female returns the office today for painful, elongated, thickened toenails which she is unable to trim herself. Denies any redness or drainage around the nails. She also states that she has painful calluses. Denies any open sores. Denies any acute changes since last appointment and no new complaints today. Denies any systemic complaints such as fevers, chills, nausea, vomiting.   Objective: AAO 3, NAD DP/PT pulses palpable, CRT less than 3 seconds Nails hypertrophic, dystrophic, elongated, brittle, discolored 10. There is tenderness overlying the nails 1-5 bilaterally. There is no surrounding erythema or drainage along the nail sites. Right submetatarsal 2 and lateral third toe of the right foot with hyperkerotic lesions. Small  Hyperkeratotic lesions on the left submetatarsal. Upon debridement there is no underlying ulceration, drainage, or other signs of infection.  No other open lesions or other pre-ulcerative lesions are identified. No other areas of tenderness bilateral lower extremities. No overlying edema, erythema, increased warmth. No pain with calf compression, swelling, warmth, erythema.  Assessment: Patient presents with symptomatic onychomycosis; hyperkerotic lesions  Plan: -Treatment options including alternatives, risks, complications were discussed -Nails sharply debrided 10 without complication/bleeding. -Hyperkerotic lesion was sharply debrided x 3 without complications/bleeding.  -Discussed daily foot inspection. If there are any changes, to call the office immediately.  -Follow-up in 3 months or sooner if any problems are to arise. In the meantime, encouraged to call the office with any questions, concerns, changes symptoms.   Celesta Gentile, DPM

## 2016-03-21 ENCOUNTER — Encounter: Payer: Self-pay | Admitting: Family Medicine

## 2016-03-21 ENCOUNTER — Inpatient Hospital Stay: Admission: RE | Admit: 2016-03-21 | Payer: Medicare Other | Source: Ambulatory Visit

## 2016-03-28 ENCOUNTER — Encounter: Payer: Self-pay | Admitting: Family Medicine

## 2016-03-28 ENCOUNTER — Ambulatory Visit (INDEPENDENT_AMBULATORY_CARE_PROVIDER_SITE_OTHER): Payer: Medicare Other | Admitting: Family Medicine

## 2016-03-28 VITALS — BP 120/66 | HR 93 | Ht 61.0 in | Wt 161.8 lb

## 2016-03-28 DIAGNOSIS — R509 Fever, unspecified: Secondary | ICD-10-CM

## 2016-03-28 DIAGNOSIS — R1030 Lower abdominal pain, unspecified: Secondary | ICD-10-CM

## 2016-03-28 NOTE — Assessment & Plan Note (Addendum)
Now resolved. Unclear if connected to abd pain vs viral illness.. No other symtpoms.

## 2016-03-28 NOTE — Assessment & Plan Note (Addendum)
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 Nml exam. If not continuing to resolve.. Consider US pelvic to consider endometrial issues.  no red flags.

## 2016-03-28 NOTE — Progress Notes (Signed)
   Subjective:    Patient ID: Tracy Dixon, female    DOB: [birth year], 75 y.o.   MRN: [id number]  HPI   75 year old female patient with history of HTN presents with continued abdominal pain.  She was seen by Dorothyann Peng, NP on 03/20/2016 for dull pressure pain at rectum for 3-4 days as well as fever to 101 F . Note reviewed in detail.  GU exam was normal. Pain was fairly diffuse on abdominal exam.   Labs, UA and CT abd/pelvis were unrevealing.  Today she reports less pelvic pressure but still some... Able to describe more like menstrual cramps. Low abd cramping and pain with pain increased when trying to take a step.  Her fever resolved 6 days ago but had lasted a week prior.  No diarrhea, no constipation, no urinary symptoms, regular BMs. No vaginal symptoms, no bleeding vaginally.  She does feel like she is getting better day to day.  Social History /Family History/Past Medical History reviewed and updated if needed.   Review of Systems  Constitutional: Negative for fever and fatigue.  HENT: Negative for ear pain.   Eyes: Negative for pain.  Respiratory: Negative for chest tightness and shortness of breath.   Cardiovascular: Negative for chest pain, palpitations and leg swelling.  Gastrointestinal: Negative for abdominal pain.  Genitourinary: Negative for dysuria.       Objective:   Physical Exam  Constitutional: Vital signs are normal. She appears well-developed and well-nourished. She is cooperative.  Non-toxic appearance. She does not appear ill. No distress.  HENT:  Head: Normocephalic.  Right Ear: Hearing, tympanic membrane, external ear and ear canal normal. Tympanic membrane is not erythematous, not retracted and not bulging.  Left Ear: Hearing, tympanic membrane, external ear and ear canal normal. Tympanic membrane is not erythematous, not retracted and not bulging.  Nose: No mucosal edema or rhinorrhea. Right sinus exhibits no maxillary sinus tenderness and no  frontal sinus tenderness. Left sinus exhibits no maxillary sinus tenderness and no frontal sinus tenderness.  Mouth/Throat: Uvula is midline, oropharynx is clear and moist and mucous membranes are normal.  Eyes: Conjunctivae, EOM and lids are normal. Pupils are equal, round, and reactive to light. Lids are everted and swept, no foreign bodies found.  Neck: Trachea normal and normal range of motion. Neck supple. Carotid bruit is not present. No thyroid mass and no thyromegaly present.  Cardiovascular: Normal rate, regular rhythm, S1 normal, S2 normal, normal heart sounds, intact distal pulses and normal pulses.  Exam reveals no gallop and no friction rub.   No murmur heard. Pulmonary/Chest: Effort normal and breath sounds normal. No tachypnea. No respiratory distress. She has no decreased breath sounds. She has no wheezes. She has no rhonchi. She has no rales.  Abdominal: Soft. Normal appearance and bowel sounds are normal. There is no tenderness.  Neurological: She is alert.  Skin: Skin is warm, dry and intact. No rash noted.  Psychiatric: Her speech is normal and behavior is normal. Judgment and thought content normal. Her mood appears not anxious. Cognition and memory are normal. She does not exhibit a depressed mood.          Assessment & Plan:

## 2016-03-28 NOTE — Progress Notes (Signed)
Pre visit review using our clinic review tool, if applicable. No additional management support is needed unless otherwise documented below in the visit note. 

## 2016-03-28 NOTE — Patient Instructions (Signed)
Call if abdominal pain not continuing to improve.

## 2016-03-29 ENCOUNTER — Ambulatory Visit: Payer: Self-pay | Admitting: Family Medicine

## 2016-06-12 DIAGNOSIS — M65321 Trigger finger, right index finger: Secondary | ICD-10-CM | POA: Diagnosis not present

## 2016-06-12 DIAGNOSIS — M542 Cervicalgia: Secondary | ICD-10-CM | POA: Diagnosis not present

## 2016-06-12 DIAGNOSIS — M65331 Trigger finger, right middle finger: Secondary | ICD-10-CM | POA: Diagnosis not present

## 2016-06-19 ENCOUNTER — Encounter: Payer: Self-pay | Admitting: Podiatry

## 2016-06-19 ENCOUNTER — Ambulatory Visit: Payer: Medicare Other | Admitting: Podiatry

## 2016-06-19 ENCOUNTER — Ambulatory Visit (INDEPENDENT_AMBULATORY_CARE_PROVIDER_SITE_OTHER): Payer: Medicare Other | Admitting: Podiatry

## 2016-06-19 DIAGNOSIS — B351 Tinea unguium: Secondary | ICD-10-CM | POA: Diagnosis not present

## 2016-06-19 DIAGNOSIS — M79673 Pain in unspecified foot: Secondary | ICD-10-CM

## 2016-06-19 DIAGNOSIS — L84 Corns and callosities: Secondary | ICD-10-CM | POA: Diagnosis not present

## 2016-06-20 NOTE — Progress Notes (Signed)
Patient ID: Tracy Dixon, female   DOB: [birth year], 75 y.o.   MRN: [id number]   Subjective: 75 y.o.-year-old female returns the office today for painful, elongated, thickened toenails which she is unable to trim herself. Denies any redness or drainage around the nails. She also states that she has painful calluses. Denies any open sores. Denies any acute changes since last appointment and no new complaints today. Denies any systemic complaints such as fevers, chills, nausea, vomiting.   Objective: AAO 3, NAD DP/PT pulses palpable, CRT less than 3 seconds Nails hypertrophic, dystrophic, elongated, brittle, discolored 10. There is tenderness overlying the nails 1-5 bilaterally. There is no surrounding erythema or drainage along the nail sites. Right submetatarsal 2 and lateral third toe of the right foot with hyperkerotic lesions. Small  Hyperkeratotic lesions on the left submetatarsal. Upon debridement there is no underlying ulceration, drainage, or other signs of infection.  No other open lesions or other pre-ulcerative lesions are identified. No other areas of tenderness bilateral lower extremities. No overlying edema, erythema, increased warmth. No pain with calf compression, swelling, warmth, erythema.  Assessment: Patient presents with symptomatic onychomycosis; hyperkerotic lesions  Plan: -Treatment options including alternatives, risks, complications were discussed -Nails sharply debrided 10 without complication/bleeding. -Hyperkerotic lesion was sharply debrided x 3 without complications/bleeding.  -Discussed daily foot inspection. If there are any changes, to call the office immediately.  -Follow-up in 3 months or sooner if any problems are to arise. In the meantime, encouraged to call the office with any questions, concerns, changes symptoms.   Celesta Gentile, DPM

## 2016-07-11 DIAGNOSIS — M542 Cervicalgia: Secondary | ICD-10-CM | POA: Diagnosis not present

## 2016-07-11 DIAGNOSIS — M65331 Trigger finger, right middle finger: Secondary | ICD-10-CM | POA: Diagnosis not present

## 2016-07-11 DIAGNOSIS — M65321 Trigger finger, right index finger: Secondary | ICD-10-CM | POA: Diagnosis not present

## 2016-07-17 ENCOUNTER — Other Ambulatory Visit: Payer: Self-pay | Admitting: Family Medicine

## 2016-08-03 DIAGNOSIS — M5136 Other intervertebral disc degeneration, lumbar region: Secondary | ICD-10-CM | POA: Diagnosis not present

## 2016-08-03 DIAGNOSIS — M542 Cervicalgia: Secondary | ICD-10-CM | POA: Diagnosis not present

## 2016-08-03 DIAGNOSIS — Z981 Arthrodesis status: Secondary | ICD-10-CM | POA: Diagnosis not present

## 2016-08-13 DIAGNOSIS — M542 Cervicalgia: Secondary | ICD-10-CM | POA: Diagnosis not present

## 2016-08-21 DIAGNOSIS — Z01812 Encounter for preprocedural laboratory examination: Secondary | ICD-10-CM | POA: Diagnosis not present

## 2016-08-21 DIAGNOSIS — M542 Cervicalgia: Secondary | ICD-10-CM | POA: Diagnosis not present

## 2016-08-23 ENCOUNTER — Ambulatory Visit (INDEPENDENT_AMBULATORY_CARE_PROVIDER_SITE_OTHER): Payer: Medicare Other | Admitting: Podiatry

## 2016-08-23 ENCOUNTER — Encounter: Payer: Self-pay | Admitting: Podiatry

## 2016-08-23 DIAGNOSIS — L84 Corns and callosities: Secondary | ICD-10-CM | POA: Diagnosis not present

## 2016-08-23 DIAGNOSIS — Q828 Other specified congenital malformations of skin: Secondary | ICD-10-CM

## 2016-08-23 DIAGNOSIS — M79673 Pain in unspecified foot: Secondary | ICD-10-CM

## 2016-08-23 DIAGNOSIS — B351 Tinea unguium: Secondary | ICD-10-CM | POA: Diagnosis not present

## 2016-08-23 NOTE — Progress Notes (Signed)
Patient ID: Tracy Dixon, female   DOB: January 20, 1941, 75 y.o.   MRN: [id number]   Subjective: 75 y.o.-year-old female returns the office today for painful, elongated, thickened toenails which she is unable to trim herself. She states that the callus is doing better on the right foot but is still thick and she is asking for an acid treatment today to help soften the area.Denies any open sores. Denies any acute changes since last appointment and no new complaints today. Denies any systemic complaints such as fevers, chills, nausea, vomiting.   Objective: AAO 3, NAD DP/PT pulses palpable, CRT less than 3 seconds Nails hypertrophic, dystrophic, elongated, brittle, discolored 10. There is tenderness overlying the nails 1-5 bilaterally. There is no surrounding erythema or drainage along the nail sites. Right submetatarsal 2 and lateral third toe of the right foot with hyperkerotic lesions. Small  Hyperkeratotic lesions on the left submetatarsal. Upon debridement there is no underlying ulceration, drainage, or other signs of infection.  No other open lesions or other pre-ulcerative lesions are identified. No other areas of tenderness bilateral lower extremities. No overlying edema, erythema, increased warmth. No pain with calf compression, swelling, warmth, erythema.  Assessment: Patient presents with symptomatic onychomycosis; hyperkerotic lesions  Plan: -Treatment options including alternatives, risks, complications were discussed -Nails sharply debrided 10 without complication/bleeding. -Hyperkerotic lesion was sharply debrided x 3 without complications/bleeding. Along lesion right foot submetatarsal to the area was debrided and cleaned. Pelvis place, held the cast and a bandage. Post procedure infection discussed. Monitor for infection. -Discussed daily foot inspection. If there are any changes, to call the office immediately.  -Follow-up in 3 months or sooner if any problems are to arise. In  the meantime, encouraged to call the office with any questions, concerns, changes symptoms.   Celesta Gentile, DPM

## 2016-08-28 ENCOUNTER — Ambulatory Visit (INDEPENDENT_AMBULATORY_CARE_PROVIDER_SITE_OTHER): Payer: Medicare Other

## 2016-08-28 DIAGNOSIS — Z23 Encounter for immunization: Secondary | ICD-10-CM | POA: Diagnosis not present

## 2016-08-30 DIAGNOSIS — M5136 Other intervertebral disc degeneration, lumbar region: Secondary | ICD-10-CM | POA: Diagnosis not present

## 2016-09-04 DIAGNOSIS — Z981 Arthrodesis status: Secondary | ICD-10-CM | POA: Diagnosis not present

## 2016-09-04 DIAGNOSIS — M5136 Other intervertebral disc degeneration, lumbar region: Secondary | ICD-10-CM | POA: Diagnosis not present

## 2016-09-15 ENCOUNTER — Other Ambulatory Visit: Payer: Self-pay | Admitting: Family Medicine

## 2016-10-03 ENCOUNTER — Other Ambulatory Visit: Payer: Self-pay | Admitting: Family Medicine

## 2016-10-03 DIAGNOSIS — Z1231 Encounter for screening mammogram for malignant neoplasm of breast: Secondary | ICD-10-CM

## 2016-10-05 ENCOUNTER — Telehealth: Payer: Self-pay | Admitting: Family Medicine

## 2016-10-05 DIAGNOSIS — M8589 Other specified disorders of bone density and structure, multiple sites: Secondary | ICD-10-CM

## 2016-10-05 DIAGNOSIS — E7800 Pure hypercholesterolemia, unspecified: Secondary | ICD-10-CM

## 2016-10-05 NOTE — Telephone Encounter (Signed)
-----   Message from Marchia Bond sent at 10/05/2016  1:47 PM EST ----- Regarding: Cpx labs Tues 11/14, need orders. Thanks :-) Please order  future cpx labs for pt's upcoming lab appt. Thanks Aniceto Boss

## 2016-10-09 ENCOUNTER — Other Ambulatory Visit (INDEPENDENT_AMBULATORY_CARE_PROVIDER_SITE_OTHER): Payer: Medicare Other

## 2016-10-09 DIAGNOSIS — E7800 Pure hypercholesterolemia, unspecified: Secondary | ICD-10-CM

## 2016-10-09 LAB — LIPID PANEL
CHOLESTEROL: 167 mg/dL (ref 0–200)
HDL: 46.4 mg/dL (ref >39.00)
LDL CALC: 108 mg/dL — AB (ref 0–99)
NonHDL: 120.49
TRIGLYCERIDES: 63 mg/dL (ref 0.0–149.0)
Total CHOL/HDL Ratio: 4
VLDL: 12.6 mg/dL (ref 0.0–40.0)

## 2016-10-09 LAB — COMPREHENSIVE METABOLIC PANEL
ALBUMIN: 3.7 g/dL (ref 3.5–5.2)
ALT: 10 U/L (ref 0–35)
AST: 14 U/L (ref 0–37)
Alkaline Phosphatase: 51 U/L (ref 39–117)
BUN: 18 mg/dL (ref 6–23)
CALCIUM: 9 mg/dL (ref 8.4–10.5)
CHLORIDE: 102 meq/L (ref 96–112)
CO2: 32 mEq/L (ref 19–32)
CREATININE: 0.76 mg/dL (ref 0.40–1.20)
GFR: 95.26 mL/min (ref >60.00)
Glucose, Bld: 74 mg/dL (ref 70–99)
POTASSIUM: 3.3 meq/L — AB (ref 3.5–5.1)
Sodium: 140 mEq/L (ref 135–145)
Total Bilirubin: 0.3 mg/dL (ref 0.2–1.2)
Total Protein: 7.4 g/dL (ref 6.0–8.3)

## 2016-10-10 ENCOUNTER — Other Ambulatory Visit: Payer: Medicare Other

## 2016-10-12 ENCOUNTER — Ambulatory Visit: Payer: Medicare Other | Admitting: Family Medicine

## 2016-10-16 ENCOUNTER — Encounter: Payer: Self-pay | Admitting: Family Medicine

## 2016-10-16 ENCOUNTER — Ambulatory Visit (INDEPENDENT_AMBULATORY_CARE_PROVIDER_SITE_OTHER): Payer: Medicare Other | Admitting: Family Medicine

## 2016-10-16 VITALS — BP 156/80 | HR 84 | Temp 97.6°F | Ht 60.5 in | Wt 170.0 lb

## 2016-10-16 DIAGNOSIS — D333 Benign neoplasm of cranial nerves: Secondary | ICD-10-CM

## 2016-10-16 DIAGNOSIS — I10 Essential (primary) hypertension: Secondary | ICD-10-CM

## 2016-10-16 DIAGNOSIS — E7800 Pure hypercholesterolemia, unspecified: Secondary | ICD-10-CM | POA: Diagnosis not present

## 2016-10-16 DIAGNOSIS — M48061 Spinal stenosis, lumbar region without neurogenic claudication: Secondary | ICD-10-CM

## 2016-10-16 DIAGNOSIS — Z0000 Encounter for general adult medical examination without abnormal findings: Secondary | ICD-10-CM

## 2016-10-16 NOTE — Assessment & Plan Note (Signed)
Followed by Dr. Rolena Infante.

## 2016-10-16 NOTE — Progress Notes (Signed)
Subjective:    Patient ID: Tracy Dixon, female    DOB: 1940/12/14, 75 y.o.   MRN: [id number]  HPI   I have personally reviewed the Medicare Annual Wellness questionnaire and have noted 1. The patient's medical and social history 2. Their use of alcohol, tobacco or illicit drugs 3. Their current medications and supplements 4. The patient's functional ability including ADL's, fall risks, home safety risks and hearing or visual             impairment. 5. Diet and physical activities 6. Evidence for depression or mood disorders 7.         Updated provider list Cognitive evaluation was performed and recorded on pt medicare questionnaire form. The patients weight, height, BMI and visual acuity have been recorded in the chart  I have made referrals, counseling and provided education to the patient based review of the above and I have provided the pt with a written personalized care plan for preventive services.    Hypertension: Elevated today on HCTZ and norvasc BP Readings from Last 3 Encounters:  10/16/16 (!) 142/69  03/28/16 120/66  03/20/16 120/76  Using medication without problems or lightheadedness:  None Chest pain with exertion: none Edema:none Short of breath:none Average home BPs: not checking at home.Marland Kitchen Has bene nml at MD[facility] lately. Other issues:  Elevated Cholesterol:  Improved from last yearLDL at goal < 130  On no med. Lab Results  Component Value Date   CHOL 167 10/09/2016   HDL 46.40 10/09/2016   LDLCALC 108 (H) 10/09/2016   TRIG 63.0 10/09/2016   CHOLHDL 4 10/09/2016  Using medications without problems: Muscle aches:  Diet compliance: Good Exercise: NONE. Considering water exercise.  Other complaints:  Social History /Family History/Past Medical History reviewed and updated if needed.   Patient Care Team: [name], MD as PCP - General Sanjuana Kava, MD as Referring Physician (Otolaryngology) Melina Schools, MD as Consulting Physician  (Orthopedic Surgery) Roseanne Kaufman, MD as Consulting Physician (Orthopedic Surgery) Paralee Cancel, MD as Consulting Physician (Orthopedic Surgery) Review of Systems  Constitutional: Negative for fatigue and fever.  HENT: Negative for congestion.   Eyes: Negative for pain.  Respiratory: Negative for cough and shortness of breath.   Cardiovascular: Negative for chest pain, palpitations and leg swelling.  Gastrointestinal: Negative for abdominal pain.       Occ chest pressure relieve by belching.. Recommended decreasing carbonation, weight loss  Genitourinary: Negative for dysuria and vaginal bleeding.  Musculoskeletal: Positive for back pain.  Neurological: Negative for syncope, light-headedness and headaches.  Psychiatric/Behavioral: Negative for dysphoric mood.       Objective:   Physical Exam  Constitutional: Vital signs are normal. She appears well-developed and well-nourished. She is cooperative.  Non-toxic appearance. She does not appear ill. No distress.  Kyphosis overweight  HENT:  Head: Normocephalic.  Right Ear: Hearing, tympanic membrane, external ear and ear canal normal.  Left Ear: Hearing, tympanic membrane, external ear and ear canal normal.  Nose: Nose normal.  Eyes: Conjunctivae, EOM and lids are normal. Pupils are equal, round, and reactive to light. Lids are everted and swept, no foreign bodies found.  Neck: Trachea normal and normal range of motion. Neck supple. Carotid bruit is not present. No thyroid mass and no thyromegaly present.  Cardiovascular: Normal rate, regular rhythm, S1 normal, S2 normal, normal heart sounds and intact distal pulses.  Exam reveals no gallop.   No murmur heard. Pulmonary/Chest: Effort normal and breath sounds normal.  No respiratory distress. She has no wheezes. She has no rhonchi. She has no rales.  Abdominal: Soft. Normal appearance and bowel sounds are normal. She exhibits no distension, no fluid wave, no abdominal bruit and no mass.  There is no hepatosplenomegaly. There is no tenderness. There is no rebound, no guarding and no CVA tenderness. No hernia.  Lymphadenopathy:    She has no cervical adenopathy.    She has no axillary adenopathy.  Neurological: She is alert. She has normal strength. No cranial nerve deficit or sensory deficit.  Skin: Skin is warm, dry and intact. No rash noted.  Psychiatric: Her speech is normal and behavior is normal. Judgment normal. Her mood appears not anxious. Cognition and memory are normal. She does not exhibit a depressed mood.          Assessment & Plan:  The patient's preventative maintenance and recommended screening tests for an annual wellness exam were reviewed in full today. Brought up to date unless services declined.  Counselled on the importance of diet, exercise, and its role in overall health and mortality. The patient's FH and SH was reviewed, including their home life, tobacco status, and drug and alcohol status.   Vaccines: uptodate Pap/DVE:  No pap/no DVE indicated, No family history of uterine or ovarian cancer. No vaginal bleeding, no discharge, mild dryness.  Mammo:  scheduled Bone Density: 2012 stable osteopenia, repeat in 5 years Colon: 2008 nml. 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  Body mass index is 32.65 kg/m. Wt Readings from Last 3 Encounters:  10/16/16 170 lb (77.1 kg)  03/28/16 161 lb 12.8 oz (73.4 kg)  03/20/16 162 lb 8 oz (73.7 kg)

## 2016-10-16 NOTE — Assessment & Plan Note (Signed)
Followed by  [facility].

## 2016-10-16 NOTE — Assessment & Plan Note (Signed)
Well controlled. Continue current medication.  

## 2016-10-16 NOTE — Assessment & Plan Note (Signed)
Moderate risk but improving LDL. On no statin medication.

## 2016-10-16 NOTE — Progress Notes (Signed)
Pre visit review using our clinic review tool, if applicable. No additional management support is needed unless otherwise documented below in the visit note. 

## 2016-10-16 NOTE — Patient Instructions (Addendum)
Work on  Getting back to exercise as able.. Water exercise would be great.  Complete advance directive .Marland Kitchen Bring [name] a copy.

## 2016-11-06 ENCOUNTER — Ambulatory Visit
Admission: RE | Admit: 2016-11-06 | Discharge: 2016-11-06 | Disposition: A | Discharge status: Home / Self Care | Payer: Medicare Other | Source: Ambulatory Visit | Attending: Family Medicine | Admitting: Family Medicine

## 2016-11-06 DIAGNOSIS — Z1231 Encounter for screening mammogram for malignant neoplasm of breast: Secondary | ICD-10-CM

## 2016-11-22 ENCOUNTER — Ambulatory Visit (INDEPENDENT_AMBULATORY_CARE_PROVIDER_SITE_OTHER): Payer: Medicare Other | Admitting: Podiatry

## 2016-11-22 ENCOUNTER — Encounter: Payer: Self-pay | Admitting: Podiatry

## 2016-11-22 DIAGNOSIS — L84 Corns and callosities: Secondary | ICD-10-CM

## 2016-11-22 DIAGNOSIS — M79674 Pain in right toe(s): Secondary | ICD-10-CM

## 2016-11-22 DIAGNOSIS — M79675 Pain in left toe(s): Secondary | ICD-10-CM | POA: Diagnosis not present

## 2016-11-22 DIAGNOSIS — B351 Tinea unguium: Secondary | ICD-10-CM

## 2016-11-22 DIAGNOSIS — Q828 Other specified congenital malformations of skin: Secondary | ICD-10-CM | POA: Diagnosis not present

## 2016-11-22 NOTE — Progress Notes (Signed)
Patient ID: Tracy Dixon, female   DOB: 1941-04-30, 75 y.o.   MRN: [id number]   Subjective: 75 y.o.-year-old female returns the office today for painful, elongated, thickened toenails which she is unable to trim herself. She states the acid treatments on the callus on her right foot "feel so good". Denies any open sores. Denies any acute changes since last appointment and no new complaints today. Denies any systemic complaints such as fevers, chills, nausea, vomiting.   Objective: AAO 3, NAD DP/PT pulses palpable, CRT less than 3 seconds Nails hypertrophic, dystrophic, elongated, brittle, discolored 10. There is tenderness overlying the nails 1-5 bilaterally. There is no surrounding erythema or drainage along the nail sites. Right submetatarsal 2 and lateral third toe of the right foot with hyperkerotic lesions. Small  Hyperkeratotic lesions on the left submetatarsal. Upon debridement there is no underlying ulceration, drainage, or other signs of infection.  No other open lesions or other pre-ulcerative lesions are identified. No other areas of tenderness bilateral lower extremities. No overlying edema, erythema, increased warmth. No pain with calf compression, swelling, warmth, erythema.  Assessment: Patient presents with symptomatic onychomycosis; hyperkerotic lesions  Plan: -Treatment options including alternatives, risks, complications were discussed -Nails sharply debrided 10 without complication/bleeding. -Hyperkerotic lesion was sharply debrided x 3 without complications/bleeding.  -On the right foot submetatarsal to the area was cleaned. A pad was placed followed by salicylic acid and a bandage. Post procedure instructions were discussed. -Follow-up in 3 weeks or sooner if any problems arise. In the meantime, encouraged to call the office with any questions, concerns, change in symptoms.   Celesta Gentile, DPM

## 2016-11-27 ENCOUNTER — Encounter: Payer: Self-pay | Admitting: Family Medicine

## 2016-11-27 ENCOUNTER — Other Ambulatory Visit (INDEPENDENT_AMBULATORY_CARE_PROVIDER_SITE_OTHER): Payer: Medicare Other | Admitting: Family Medicine

## 2016-11-27 DIAGNOSIS — R300 Dysuria: Secondary | ICD-10-CM

## 2016-11-27 LAB — POC URINALSYSI DIPSTICK (AUTOMATED)
BILIRUBIN UA: NEGATIVE
Blood, UA: NEGATIVE
Glucose, UA: NEGATIVE
Ketones, UA: NEGATIVE
LEUKOCYTES UA: NEGATIVE
NITRITE UA: NEGATIVE
Protein, UA: NEGATIVE
Spec Grav, UA: 1.025
Urobilinogen, UA: 0.2
pH, UA: 6

## 2016-12-31 ENCOUNTER — Other Ambulatory Visit: Payer: Self-pay | Admitting: *Deleted

## 2016-12-31 MED ORDER — HYDROCHLOROTHIAZIDE 25 MG PO TABS: 25.0000 mg | ORAL_TABLET | Freq: Every day | ORAL | 1 refills | Status: DC

## 2016-12-31 MED ORDER — AMLODIPINE BESYLATE 10 MG PO TABS: 10.0000 mg | ORAL_TABLET | Freq: Every day | ORAL | 1 refills | Status: DC

## 2017-01-07 ENCOUNTER — Encounter: Payer: Self-pay | Admitting: Gastroenterology

## 2017-01-17 ENCOUNTER — Encounter: Payer: Self-pay | Admitting: Family Medicine

## 2017-01-18 ENCOUNTER — Encounter: Payer: Self-pay | Admitting: Family Medicine

## 2017-01-18 ENCOUNTER — Ambulatory Visit (INDEPENDENT_AMBULATORY_CARE_PROVIDER_SITE_OTHER): Payer: Medicare Other | Admitting: Family Medicine

## 2017-01-18 VITALS — BP 114/78 | HR 92 | Temp 98.7°F | Ht 60.5 in | Wt 172.0 lb

## 2017-01-18 DIAGNOSIS — M7022 Olecranon bursitis, left elbow: Secondary | ICD-10-CM | POA: Insufficient documentation

## 2017-01-18 DIAGNOSIS — J111 Influenza due to unidentified influenza virus with other respiratory manifestations: Secondary | ICD-10-CM | POA: Diagnosis not present

## 2017-01-18 MED ORDER — IBUPROFEN 800 MG PO TABS: 800.0000 mg | ORAL_TABLET | Freq: Three times a day (TID) | ORAL | 0 refills | Status: DC | PRN

## 2017-01-18 MED ORDER — OSELTAMIVIR PHOSPHATE 75 MG PO CAPS: 75.0000 mg | ORAL_CAPSULE | Freq: Two times a day (BID) | ORAL | 0 refills | Status: DC

## 2017-01-18 NOTE — Patient Instructions (Addendum)
Start tamiflu for presumed flu. Rest, Fluids.  Olecranon bursitis: Start ibuprofen 800 mg every 8 hours for elbow inflammation and pain. Treat elbow with ice, protect with arm brace and if worsening or pain increasing in next week return for aspiration and fluid analysis with Dr. Lorelei Pont or [facility].

## 2017-01-18 NOTE — Progress Notes (Signed)
   Subjective:    Patient ID: Tracy Dixon, female    DOB: January 18, 1941, 76 y.o.   MRN: [id number]  Fever   This is a new problem. The current episode started in the past 7 days (  2 days). The problem has been rapidly worsening. The maximum temperature noted was 100 to 100.9 F. Associated symptoms include congestion, coughing, headaches, muscle aches and nausea. Pertinent negatives include no chest pain, ear pain, rash, sore throat, vomiting or wheezing. Associated symptoms comments: Chills  A lot of post nasal drip  no SOB . She has tried nothing (has not needed albuterol inhaler) for the symptoms. The treatment provided no relief.  Risk factors: sick contacts     She noted new onset redness in left elbow yesterday.Marland Kitchen Swollen , red, warm. Pain is only when leaning on it. No decrease in ROM. No new injury, no fall, no repetitive motion in particular. Has not done anything for this.  HX: mild intermittent asthma   Review of Systems  Constitutional: Positive for fever.  HENT: Positive for congestion. Negative for ear pain and sore throat.   Respiratory: Positive for cough. Negative for wheezing.   Cardiovascular: Negative for chest pain.  Gastrointestinal: Positive for nausea. Negative for vomiting.  Skin: Negative for rash.  Neurological: Positive for headaches.       Objective:   Physical Exam  Constitutional: Vital signs are normal. She appears well-developed and well-nourished. She is cooperative.  Non-toxic appearance. She does not appear ill. No distress.  HENT:  Head: Normocephalic.  Right Ear: Hearing, tympanic membrane, external ear and ear canal normal. Tympanic membrane is not erythematous, not retracted and not bulging. No middle ear effusion.  Left Ear: Hearing, tympanic membrane, external ear and ear canal normal. Tympanic membrane is not erythematous, not retracted and not bulging.  No middle ear effusion.  Nose: Mucosal edema and rhinorrhea present. Right sinus  exhibits no maxillary sinus tenderness and no frontal sinus tenderness. Left sinus exhibits no maxillary sinus tenderness and no frontal sinus tenderness.  Mouth/Throat: Uvula is midline and mucous membranes are normal. Posterior oropharyngeal erythema present. No oropharyngeal exudate or posterior oropharyngeal edema.  Eyes: Conjunctivae, EOM and lids are normal. Pupils are equal, round, and reactive to light. Lids are everted and swept, no foreign bodies found.  Neck: Trachea normal and normal range of motion. Neck supple. Carotid bruit is not present. No thyroid mass and no thyromegaly present.  Cardiovascular: Normal rate, regular rhythm, S1 normal, S2 normal, normal heart sounds, intact distal pulses and normal pulses.  Exam reveals no gallop and no friction rub.   No murmur heard. Pulmonary/Chest: Effort normal and breath sounds normal. No tachypnea. No respiratory distress. She has no decreased breath sounds. She has no wheezes. She has no rhonchi. She has no rales.  Musculoskeletal:       Left elbow: She exhibits swelling and effusion. She exhibits normal range of motion.  Swollen left olecranon bursa, slight redness and warmth, minimal pain  Neurological: She is alert.  Skin: Skin is warm, dry and intact. No rash noted.  Psychiatric: Her speech is normal and behavior is normal. Judgment normal. Her mood appears not anxious. Cognition and memory are normal. She does not exhibit a depressed mood.          Assessment & Plan:

## 2017-01-18 NOTE — Assessment & Plan Note (Signed)
Not  Indication of septic bursitis.Marland Kitchen minimal pain.  Fever connected to pt's flu symptoms.  Treat with ice, NASIDs and if worsening or pain increasing in next week return for aspiration and fluid analysis with Dr. Lorelei Pont or [facility].

## 2017-01-18 NOTE — Progress Notes (Signed)
Pre visit review using our clinic review tool, if applicable. No additional management support is needed unless otherwise documented below in the visit note. 

## 2017-01-18 NOTE — Assessment & Plan Note (Signed)
High pretest probability. No test done.. Treat with tamiflu and symptomatic care. Push fluids. Discussed symptomatic care.  Hydration, rest. Call if SOB, cough worsening or prolongued fever. Reviewed flu timeline.  Discussed family prophylaxis to consider tamiflu prophylaxis. She was advised to not return to work until 24 hour after fever resolved on no antipyretics.

## 2017-01-25 ENCOUNTER — Encounter: Payer: Self-pay | Admitting: Podiatry

## 2017-01-25 ENCOUNTER — Ambulatory Visit (INDEPENDENT_AMBULATORY_CARE_PROVIDER_SITE_OTHER): Payer: Medicare Other | Admitting: Podiatry

## 2017-01-25 DIAGNOSIS — Q828 Other specified congenital malformations of skin: Secondary | ICD-10-CM

## 2017-01-25 DIAGNOSIS — M79674 Pain in right toe(s): Secondary | ICD-10-CM

## 2017-01-25 DIAGNOSIS — B351 Tinea unguium: Secondary | ICD-10-CM

## 2017-01-25 DIAGNOSIS — M79675 Pain in left toe(s): Secondary | ICD-10-CM | POA: Diagnosis not present

## 2017-01-25 NOTE — Progress Notes (Signed)
Patient ID: ZAMARIYAH SCARPINATO, female   DOB: 12-22-1940, 76 y.o.   MRN: [id number]   Subjective: 76 y.o.-year-old female returns the office today for painful, elongated, thickened toenails which she is unable to trim herself. Denies any open sores. Denies any acute changes since last appointment and no new complaints today. Denies any systemic complaints such as fevers, chills, nausea, vomiting.   Objective: AAO 3, NAD DP/PT pulses palpable, CRT less than 3 seconds Nails hypertrophic, dystrophic, elongated, brittle, discolored 10. There is tenderness overlying the nails 1-5 bilaterally. There is no surrounding erythema or drainage along the nail sites. Right submetatarsal 2 and lateral third toe of the right foot with hyperkerotic lesions. Small hyperkeratotic lesions on the left submetatarsal. Upon debridement there is no underlying ulceration, drainage, or other signs of infection.  No other open lesions or other pre-ulcerative lesions are identified. No other areas of tenderness bilateral lower extremities. No overlying edema, erythema, increased warmth. No pain with calf compression, swelling, warmth, erythema.  Assessment: Patient presents with symptomatic onychomycosis; hyperkerotic lesions  Plan: -Treatment options including alternatives, risks, complications were discussed -Nails sharply debrided 10 without complication/bleeding. -Hyperkerotic lesion was sharply debrided x 3 without complications/bleeding.  -On the right foot submetatarsal to the area was cleaned. A pad was placed followed by salicylic acid and a bandage. Post procedure instructions were discussed. -Follow-up in 3 weeks or sooner if any problems arise. In the meantime, encouraged to call the office with any questions, concerns, change in symptoms.   Celesta Gentile, DPM

## 2017-04-20 DIAGNOSIS — J209 Acute bronchitis, unspecified: Secondary | ICD-10-CM | POA: Diagnosis not present

## 2017-04-20 DIAGNOSIS — J189 Pneumonia, unspecified organism: Secondary | ICD-10-CM | POA: Diagnosis not present

## 2017-04-23 ENCOUNTER — Encounter: Payer: Self-pay | Admitting: Family Medicine

## 2017-04-25 ENCOUNTER — Ambulatory Visit (INDEPENDENT_AMBULATORY_CARE_PROVIDER_SITE_OTHER): Payer: Medicare Other | Admitting: Family Medicine

## 2017-04-25 ENCOUNTER — Encounter: Payer: Self-pay | Admitting: Podiatry

## 2017-04-25 ENCOUNTER — Encounter: Payer: Self-pay | Admitting: Family Medicine

## 2017-04-25 ENCOUNTER — Ambulatory Visit (INDEPENDENT_AMBULATORY_CARE_PROVIDER_SITE_OTHER): Payer: Medicare Other | Admitting: Podiatry

## 2017-04-25 VITALS — BP 137/81 | HR 82 | Temp 97.6°F | Ht 60.5 in | Wt 170.5 lb

## 2017-04-25 DIAGNOSIS — M79674 Pain in right toe(s): Secondary | ICD-10-CM

## 2017-04-25 DIAGNOSIS — J40 Bronchitis, not specified as acute or chronic: Secondary | ICD-10-CM | POA: Diagnosis not present

## 2017-04-25 DIAGNOSIS — M79675 Pain in left toe(s): Secondary | ICD-10-CM | POA: Diagnosis not present

## 2017-04-25 DIAGNOSIS — Q828 Other specified congenital malformations of skin: Secondary | ICD-10-CM | POA: Diagnosis not present

## 2017-04-25 DIAGNOSIS — B351 Tinea unguium: Secondary | ICD-10-CM

## 2017-04-25 NOTE — Progress Notes (Signed)
Patient ID: Tracy Dixon, female   DOB: 06-Dec-1940, 76 y.o.   MRN: [id number]   Subjective: 76 y.o.-year-old female returns the office today for painful, elongated, thickened toenails which she is unable to trim herself. Denies any open sores. She continues to painful calluses to her feet. Denies any acute changes since last appointment and no new complaints today. Denies any systemic complaints such as fevers, chills, nausea, vomiting.   Objective: AAO 3, NAD DP/PT pulses palpable, CRT less than 3 seconds Nails hypertrophic, dystrophic, elongated, brittle, discolored 10. There is tenderness overlying the nails 1-5 bilaterally. There is no surrounding erythema or drainage along the nail sites. Right submetatarsal 2 and lateral third toe of the right foot with hyperkerotic lesions. Small hyperkeratotic lesions on the left submetatarsal. Upon debridement there is no underlying ulceration, drainage, or other signs of infection.  No other open lesions or other pre-ulcerative lesions are identified. No other areas of tenderness bilateral lower extremities. No overlying edema, erythema, increased warmth. No pain with calf compression, swelling, warmth, erythema.  Assessment: Patient presents with symptomatic onychomycosis; hyperkerotic lesions  Plan: -Treatment options including alternatives, risks, complications were discussed -Nails sharply debrided 10 without complication/bleeding. -Hyperkerotic lesion was sharply debrided x 3 without complications/bleeding.  -Follow-up in 9 weeks or sooner if any problems arise. In the meantime, encouraged to call the office with any questions, concerns, change in symptoms.   Celesta Gentile, DPM

## 2017-04-25 NOTE — Patient Instructions (Signed)
Using  mucinex twice daily, albuterol as needed and continue zyrtec.   Call if not continuing to improve or if new fever.

## 2017-04-25 NOTE — Assessment & Plan Note (Signed)
Improved after treatment with pred,antibitoics, allergy med and albuterol.  Continue mucinex, albuterol prn and zyrtec.

## 2017-04-25 NOTE — Progress Notes (Signed)
   Subjective:    Patient ID: Tracy Dixon, female    DOB: 26-Jan-1941, 76 y.o.   MRN: [id number]  HPI    76 year old female with intermittent asthma and allergies presents for follow up PNA/bronchitis.  She reports dry cough, nasal congestion on 5/21.Marland Kitchen symptoms progressed to constant cough and shortness of breath.  She was taking zyrtec but did not get better. Seen at [facility] on 5/26. Treated with z-pack, prednisone and albuterol.  Since then has improved some, still with dry cough. No further fever. Much improved shortness of breath.  She has had a lot insomnia on prednisone.     Review of Systems  Constitutional: Negative for fatigue and fever.  HENT: Negative for ear pain.   Eyes: Negative for pain.  Respiratory: Positive for cough. Negative for chest tightness and shortness of breath.   Cardiovascular: Negative for chest pain, palpitations and leg swelling.  Gastrointestinal: Negative for abdominal pain.  Genitourinary: Negative for dysuria.       Objective:   Physical Exam  Constitutional: Vital signs are normal. She appears well-developed and well-nourished. She is cooperative.  Non-toxic appearance. She does not appear ill. No distress.  HENT:  Head: Normocephalic.  Right Ear: Hearing, tympanic membrane, external ear and ear canal normal. Tympanic membrane is not erythematous, not retracted and not bulging.  Left Ear: Hearing, tympanic membrane, external ear and ear canal normal. Tympanic membrane is not erythematous, not retracted and not bulging.  Nose: Mucosal edema and rhinorrhea present. Right sinus exhibits no maxillary sinus tenderness and no frontal sinus tenderness. Left sinus exhibits no maxillary sinus tenderness and no frontal sinus tenderness.  Mouth/Throat: Uvula is midline, oropharynx is clear and moist and mucous membranes are normal.  Eyes: Conjunctivae, EOM and lids are normal. Pupils are equal, round, and reactive to light. Lids are everted and  swept, no foreign bodies found.  Neck: Trachea normal and normal range of motion. Neck supple. Carotid bruit is not present. No thyroid mass and no thyromegaly present.  Cardiovascular: Normal rate, regular rhythm, S1 normal, S2 normal, normal heart sounds, intact distal pulses and normal pulses.  Exam reveals no gallop and no friction rub.   No murmur heard. Pulmonary/Chest: Effort normal and breath sounds normal. No tachypnea. No respiratory distress. She has no decreased breath sounds. She has no wheezes. She has no rhonchi. She has no rales.  Neurological: She is alert.  Skin: Skin is warm, dry and intact. No rash noted.  Psychiatric: Her speech is normal and behavior is normal. Judgment normal. Her mood appears not anxious. Cognition and memory are normal. She does not exhibit a depressed mood.          Assessment & Plan:

## 2017-04-26 ENCOUNTER — Ambulatory Visit: Payer: Medicare Other | Admitting: Podiatry

## 2017-04-26 ENCOUNTER — Other Ambulatory Visit: Payer: Self-pay | Admitting: Otolaryngology

## 2017-04-26 DIAGNOSIS — D333 Benign neoplasm of cranial nerves: Secondary | ICD-10-CM

## 2017-05-09 ENCOUNTER — Ambulatory Visit
Admission: RE | Admit: 2017-05-09 | Discharge: 2017-05-09 | Disposition: A | Discharge status: Home / Self Care | Payer: Medicare Other | Source: Ambulatory Visit | Attending: Otolaryngology | Admitting: Otolaryngology

## 2017-05-09 DIAGNOSIS — D333 Benign neoplasm of cranial nerves: Secondary | ICD-10-CM

## 2017-05-09 MED ORDER — GADOBENATE DIMEGLUMINE 529 MG/ML IV SOLN
15.0000 mL | Freq: Once | INTRAVENOUS | Status: AC | PRN
  Administered 2017-05-09: 15 mL via INTRAVENOUS

## 2017-06-10 DIAGNOSIS — D333 Benign neoplasm of cranial nerves: Secondary | ICD-10-CM | POA: Diagnosis not present

## 2017-06-27 ENCOUNTER — Ambulatory Visit (INDEPENDENT_AMBULATORY_CARE_PROVIDER_SITE_OTHER): Payer: Medicare Other | Admitting: Podiatry

## 2017-06-27 ENCOUNTER — Encounter: Payer: Self-pay | Admitting: Podiatry

## 2017-06-27 DIAGNOSIS — B351 Tinea unguium: Secondary | ICD-10-CM | POA: Diagnosis not present

## 2017-06-27 DIAGNOSIS — M79675 Pain in left toe(s): Secondary | ICD-10-CM | POA: Diagnosis not present

## 2017-06-27 DIAGNOSIS — M79674 Pain in right toe(s): Secondary | ICD-10-CM

## 2017-06-27 DIAGNOSIS — Q828 Other specified congenital malformations of skin: Secondary | ICD-10-CM | POA: Diagnosis not present

## 2017-06-27 NOTE — Progress Notes (Signed)
Patient ID: Tracy Dixon, female   DOB: 1940-12-17, 76 y.o.   MRN: [id number]   Subjective: 76 y.o.-year-old female returns the office today for painful, elongated, thickened toenails which she is unable to trim herself. She also has painful calluses to her feet. No swelling or redness, drainage. Denies any open sores. She continues to painful calluses to her feet. Denies any acute changes since last appointment and no new complaints today. Denies any systemic complaints such as fevers, chills, nausea, vomiting.   Objective: AAO 3, NAD DP/PT pulses palpable, CRT less than 3 seconds Nails hypertrophic, dystrophic, elongated, brittle, discolored 10. There is tenderness overlying the nails 1-5 bilaterally. There is no surrounding erythema or drainage along the nail sites. Right submetatarsal 2 and lateral third toe of the right foot with hyperkerotic lesions. Small hyperkeratotic lesions on the left submetatarsal. Upon debridement there is no underlying ulceration, drainage, or other signs of infection.  No other open lesions or other pre-ulcerative lesions are identified. No other areas of tenderness bilateral lower extremities. No overlying edema, erythema, increased warmth. No pain with calf compression, swelling, warmth, erythema.  Assessment: Patient presents with symptomatic onychomycosis; hyperkerotic lesions  Plan: -Treatment options including alternatives, risks, complications were discussed -Nails sharply debrided 10 without complication/bleeding. -Hyperkerotic lesion was sharply debrided x 3 without complications/bleeding.  -Follow-up in 9 weeks or sooner if any problems arise. In the meantime, encouraged to call the office with any questions, concerns, change in symptoms.   Celesta Gentile, DPM

## 2017-08-29 ENCOUNTER — Encounter: Payer: Self-pay | Admitting: Podiatry

## 2017-08-29 ENCOUNTER — Ambulatory Visit (INDEPENDENT_AMBULATORY_CARE_PROVIDER_SITE_OTHER): Payer: Medicare Other | Admitting: Podiatry

## 2017-08-29 DIAGNOSIS — M79675 Pain in left toe(s): Secondary | ICD-10-CM | POA: Diagnosis not present

## 2017-08-29 DIAGNOSIS — B351 Tinea unguium: Secondary | ICD-10-CM

## 2017-08-29 DIAGNOSIS — M79674 Pain in right toe(s): Secondary | ICD-10-CM

## 2017-08-29 DIAGNOSIS — Q828 Other specified congenital malformations of skin: Secondary | ICD-10-CM | POA: Diagnosis not present

## 2017-09-02 NOTE — Progress Notes (Signed)
Patient ID: Tracy Dixon, female   DOB: 11/03/1941, 76 y.o.   MRN: [id number]   Subjective: 76 y.o.-year-old female returns the office today for painful, elongated, thickened toenails which she is unable to trim herself. She also has painful calluses to her feet. No swelling or redness, drainage. Denies any open sores. She continues to painful calluses to her feet. Denies any acute changes since last appointment and no new complaints today. Denies any systemic complaints such as fevers, chills, nausea, vomiting.   Objective: AAO 3, NAD DP/PT pulses palpable, CRT less than 3 seconds Nails hypertrophic, dystrophic, elongated, brittle, discolored 10. There is tenderness overlying the nails 1-5 bilaterally. There is no surrounding erythema or drainage along the nail sites. Right submetatarsal 2 and lateral third toe of the right foot with hyperkerotic lesions. Small hyperkeratotic lesions on the left submetatarsal. Upon debridement there is no underlying ulceration, drainage, or other signs of infection.  No other open lesions or other pre-ulcerative lesions are identified. No other areas of tenderness bilateral lower extremities. No overlying edema, erythema, increased warmth. No pain with calf compression, swelling, warmth, erythema.  Assessment: Patient presents with symptomatic onychomycosis; hyperkerotic lesions  Plan: -Treatment options including alternatives, risks, complications were discussed -Nails sharply debrided 10 without complication/bleeding. -Hyperkerotic lesion was sharply debrided x 3 without complications/bleeding. On the right submetatarsal to area the area was cleaned and a pad was placed side by salicylic acid and a bandage. Post procedure instructions were discussed. Monitor for infection. -Follow-up in 9 weeks or sooner if any problems arise. In the meantime, encouraged to call the office with any questions, concerns, change in symptoms.   Celesta Gentile, DPM

## 2017-09-03 ENCOUNTER — Ambulatory Visit (INDEPENDENT_AMBULATORY_CARE_PROVIDER_SITE_OTHER): Payer: Medicare Other

## 2017-09-03 DIAGNOSIS — Z23 Encounter for immunization: Secondary | ICD-10-CM

## 2017-09-11 DIAGNOSIS — M1812 Unilateral primary osteoarthritis of first carpometacarpal joint, left hand: Secondary | ICD-10-CM | POA: Diagnosis not present

## 2017-09-11 DIAGNOSIS — M25531 Pain in right wrist: Secondary | ICD-10-CM | POA: Diagnosis not present

## 2017-09-11 DIAGNOSIS — M65331 Trigger finger, right middle finger: Secondary | ICD-10-CM | POA: Diagnosis not present

## 2017-09-19 ENCOUNTER — Other Ambulatory Visit: Payer: Self-pay | Admitting: Family Medicine

## 2017-10-08 DIAGNOSIS — M1812 Unilateral primary osteoarthritis of first carpometacarpal joint, left hand: Secondary | ICD-10-CM | POA: Diagnosis not present

## 2017-10-08 DIAGNOSIS — M65331 Trigger finger, right middle finger: Secondary | ICD-10-CM | POA: Diagnosis not present

## 2017-10-09 DIAGNOSIS — M79652 Pain in left thigh: Secondary | ICD-10-CM | POA: Diagnosis not present

## 2017-10-09 DIAGNOSIS — Z96651 Presence of right artificial knee joint: Secondary | ICD-10-CM | POA: Diagnosis not present

## 2017-10-09 DIAGNOSIS — Z96653 Presence of artificial knee joint, bilateral: Secondary | ICD-10-CM | POA: Diagnosis not present

## 2017-10-25 DIAGNOSIS — M5136 Other intervertebral disc degeneration, lumbar region: Secondary | ICD-10-CM | POA: Diagnosis not present

## 2017-10-28 ENCOUNTER — Telehealth: Payer: Self-pay | Admitting: Family Medicine

## 2017-10-28 DIAGNOSIS — M8589 Other specified disorders of bone density and structure, multiple sites: Secondary | ICD-10-CM

## 2017-10-28 DIAGNOSIS — E7800 Pure hypercholesterolemia, unspecified: Secondary | ICD-10-CM

## 2017-10-28 NOTE — Telephone Encounter (Signed)
-----   Message from Eustace Pen, LPN sent at [id number]  3:09 PM EST ----- Regarding: Labs 12/3 Lab orders needed. Thank you.  Insurance:  Medicare - traditional

## 2017-10-29 ENCOUNTER — Ambulatory Visit: Payer: Medicare Other

## 2017-10-31 ENCOUNTER — Encounter: Payer: Self-pay | Admitting: Podiatry

## 2017-10-31 ENCOUNTER — Ambulatory Visit (INDEPENDENT_AMBULATORY_CARE_PROVIDER_SITE_OTHER): Payer: Medicare Other | Admitting: Podiatry

## 2017-10-31 DIAGNOSIS — B351 Tinea unguium: Secondary | ICD-10-CM

## 2017-10-31 DIAGNOSIS — M79675 Pain in left toe(s): Secondary | ICD-10-CM

## 2017-10-31 DIAGNOSIS — M79674 Pain in right toe(s): Secondary | ICD-10-CM

## 2017-10-31 DIAGNOSIS — Q828 Other specified congenital malformations of skin: Secondary | ICD-10-CM

## 2017-11-01 ENCOUNTER — Ambulatory Visit: Payer: Medicare Other

## 2017-11-04 ENCOUNTER — Ambulatory Visit: Payer: Medicare Other

## 2017-11-04 NOTE — Progress Notes (Signed)
Patient ID: Tracy Dixon, female   DOB: October 29, 1941, 76 y.o.   MRN: [id number]   Subjective: 76 y.o.-year-old female returns the office today for painful, elongated, thickened toenails which she is unable to trim herself. She also has painful calluses to her feet. No swelling or redness, drainage. Denies any open sores. She continues to painful calluses to her feet. Denies any acute changes since last appointment and no new complaints today. Denies any systemic complaints such as fevers, chills, nausea, vomiting.   She is recovering from a pulled hamstring. She is scheduled to start therapy for this and completed a medrol dose pack.   Objective: AAO 3, NAD DP/PT pulses palpable, CRT less than 3 seconds Nails hypertrophic, dystrophic, elongated, brittle, discolored 10. There is tenderness overlying the nails 1-5 bilaterally. There is no surrounding erythema or drainage along the nail sites. Right submetatarsal 2 and lateral third toe of the right foot with hyperkerotic lesions. Small hyperkeratotic lesions on the left submetatarsal. Upon debridement there is no underlying ulceration, drainage, or other signs of infection.  Overall the hyperkeratotic tissue of the right submetatarsal area is minimal and much improved today. No other open lesions or other pre-ulcerative lesions are identified. No other areas of tenderness bilateral lower extremities. No overlying edema, erythema, increased warmth. No pain with calf compression, swelling, warmth, erythema.  Assessment: Patient presents with symptomatic onychomycosis; hyperkerotic lesions  Plan: -Treatment options including alternatives, risks, complications were discussed -Nails sharply debrided 10 without complication/bleeding. -Hyperkerotic lesion was sharply debrided x 3 without complications/bleeding.  -Follow-up in 9 weeks or sooner if any problems arise. In the meantime, encouraged to call the office with any questions, concerns, change  in symptoms.   Celesta Gentile, DPM

## 2017-11-06 DIAGNOSIS — M5136 Other intervertebral disc degeneration, lumbar region: Secondary | ICD-10-CM | POA: Diagnosis not present

## 2017-11-07 ENCOUNTER — Encounter: Payer: Medicare Other | Admitting: Family Medicine

## 2017-11-12 ENCOUNTER — Ambulatory Visit (INDEPENDENT_AMBULATORY_CARE_PROVIDER_SITE_OTHER): Payer: Medicare Other | Admitting: Family Medicine

## 2017-11-12 ENCOUNTER — Encounter: Payer: Self-pay | Admitting: Family Medicine

## 2017-11-12 ENCOUNTER — Ambulatory Visit (INDEPENDENT_AMBULATORY_CARE_PROVIDER_SITE_OTHER): Payer: Medicare Other

## 2017-11-12 VITALS — BP 108/70 | HR 88 | Temp 97.7°F | Ht 60.5 in | Wt 166.2 lb

## 2017-11-12 VITALS — BP 122/66 | HR 102 | Temp 98.0°F | Ht 60.0 in | Wt 166.4 lb

## 2017-11-12 DIAGNOSIS — Z0000 Encounter for general adult medical examination without abnormal findings: Secondary | ICD-10-CM

## 2017-11-12 DIAGNOSIS — Z1231 Encounter for screening mammogram for malignant neoplasm of breast: Secondary | ICD-10-CM

## 2017-11-12 DIAGNOSIS — M8589 Other specified disorders of bone density and structure, multiple sites: Secondary | ICD-10-CM | POA: Diagnosis not present

## 2017-11-12 DIAGNOSIS — I10 Essential (primary) hypertension: Secondary | ICD-10-CM

## 2017-11-12 DIAGNOSIS — E7800 Pure hypercholesterolemia, unspecified: Secondary | ICD-10-CM

## 2017-11-12 DIAGNOSIS — Z0001 Encounter for general adult medical examination with abnormal findings: Secondary | ICD-10-CM | POA: Diagnosis not present

## 2017-11-12 DIAGNOSIS — R143 Flatulence: Secondary | ICD-10-CM

## 2017-11-12 DIAGNOSIS — E2839 Other primary ovarian failure: Secondary | ICD-10-CM

## 2017-11-12 DIAGNOSIS — D333 Benign neoplasm of cranial nerves: Secondary | ICD-10-CM

## 2017-11-12 LAB — COMPREHENSIVE METABOLIC PANEL
ALK PHOS: 50 U/L (ref 39–117)
ALT: 11 U/L (ref 0–35)
AST: 14 U/L (ref 0–37)
Albumin: 3.8 g/dL (ref 3.5–5.2)
BILIRUBIN TOTAL: 0.5 mg/dL (ref 0.2–1.2)
BUN: 14 mg/dL (ref 6–23)
CO2: 33 meq/L — AB (ref 19–32)
CREATININE: 0.73 mg/dL (ref 0.40–1.20)
Calcium: 9.3 mg/dL (ref 8.4–10.5)
Chloride: 101 mEq/L (ref 96–112)
GFR: 99.5 mL/min (ref >60.00)
GLUCOSE: 85 mg/dL (ref 70–99)
Potassium: 4.4 mEq/L (ref 3.5–5.1)
Sodium: 140 mEq/L (ref 135–145)
TOTAL PROTEIN: 7.6 g/dL (ref 6.0–8.3)

## 2017-11-12 LAB — LIPID PANEL
CHOL/HDL RATIO: 3
Cholesterol: 173 mg/dL (ref 0–200)
HDL: 49.7 mg/dL (ref >39.00)
LDL Cholesterol: 110 mg/dL — ABNORMAL HIGH (ref 0–99)
NONHDL: 123.47
TRIGLYCERIDES: 66 mg/dL (ref 0.0–149.0)
VLDL: 13.2 mg/dL (ref 0.0–40.0)

## 2017-11-12 LAB — VITAMIN D 25 HYDROXY (VIT D DEFICIENCY, FRACTURES): VITD: 62.02 ng/mL (ref 30.00–100.00)

## 2017-11-12 MED ORDER — ATORVASTATIN CALCIUM 20 MG PO TABS: 20.0000 mg | ORAL_TABLET | Freq: Every day | ORAL | 11 refills | Status: DC

## 2017-11-12 NOTE — Progress Notes (Signed)
Subjective:    Patient ID: Tracy Dixon, female    DOB: May 02, 1941, 76 y.o.   MRN: [id number]  HPI  The patient presents for complete physical and review of chronic health problems.   She is also having new burping and excessive gas. 5/7 days a week  Occurring.. No stomach pain. Occ "throwing up in mouth".. Rarely. Tums helps.  The patient saw Candis Musa, LPN for medicare wellness. Note reviewed in detail and important notes copied below.   She is doing PT for back.  recently treated hamstring issue.. Now resolved.  Hypertension:   Good control on HCTZ and norvasc  Using medication without problems or lightheadedness:  none Chest pain with exertion: none Edema:none Short of breath:none Average home BPs: not checking Other issues:  Elevated Cholesterol:  AHA 10 year risk:  13% Diet compliance: eats a lot of eggs Exercise: walking. Other complaints:     Social History /Family History/Past Medical History reviewed in detail and updated in EMR if needed. Blood pressure 122/66, pulse (!) 102, temperature 98 F (36.7 C), temperature source Oral, height 5' (1.524 m), weight 166 lb 6.4 oz (75.5 kg), SpO2 97 %.    Review of Systems  Constitutional: Negative for fatigue and fever.  HENT: Negative for congestion.   Eyes: Negative for pain.  Respiratory: Negative for cough and shortness of breath.   Cardiovascular: Negative for chest pain, palpitations and leg swelling.  Gastrointestinal: Negative for abdominal pain.  Genitourinary: Negative for dysuria and vaginal bleeding.  Musculoskeletal: Negative for back pain.  Neurological: Negative for syncope, light-headedness and headaches.  Psychiatric/Behavioral: Negative for dysphoric mood.       Objective:   Physical Exam  Constitutional: Vital signs are normal. She appears well-developed and well-nourished. She is cooperative.  Non-toxic appearance. She does not appear ill. No distress.  obesity  HENT:  Head:  Normocephalic.  Right Ear: Hearing, tympanic membrane, external ear and ear canal normal. Tympanic membrane is not erythematous, not retracted and not bulging.  Left Ear: Hearing, tympanic membrane, external ear and ear canal normal. Tympanic membrane is not erythematous, not retracted and not bulging.  Nose: No mucosal edema or rhinorrhea. Right sinus exhibits no maxillary sinus tenderness and no frontal sinus tenderness. Left sinus exhibits no maxillary sinus tenderness and no frontal sinus tenderness.  Mouth/Throat: Uvula is midline, oropharynx is clear and moist and mucous membranes are normal.  Eyes: Conjunctivae, EOM and lids are normal. Pupils are equal, round, and reactive to light. Lids are everted and swept, no foreign bodies found.  Neck: Trachea normal and normal range of motion. Neck supple. Carotid bruit is not present. No thyroid mass and no thyromegaly present.  Cardiovascular: Normal rate, regular rhythm, S1 normal, S2 normal, normal heart sounds, intact distal pulses and normal pulses. Exam reveals no gallop and no friction rub.  No murmur heard.  Left leg swelling, varicose veins, chronic  Pulmonary/Chest: Effort normal and breath sounds normal. No tachypnea. No respiratory distress. She has no decreased breath sounds. She has no wheezes. She has no rhonchi. She has no rales.  Abdominal: Soft. Normal appearance and bowel sounds are normal. There is no tenderness.  Neurological: She is alert.  Skin: Skin is warm, dry and intact. No rash noted.  Psychiatric: Her speech is normal and behavior is normal. Judgment and thought content normal. Her mood appears not anxious. Cognition and memory are normal. She does not exhibit a depressed mood.  Assessment & Plan:  The patient's preventative maintenance and recommended screening tests for an annual wellness exam were reviewed in full today. Brought up to date unless services declined.  Counselled on the importance of diet,  exercise, and its role in overall health and mortality. The patient's FH and SH was reviewed, including their home life, tobacco status, and drug and alcohol status.   Vaccines: uptodate Pap/DVE:  No pap/no DVE indicated, No family history of uterine or ovarian cancer. No vaginal bleeding, no discharge. Mammo:   10/2016 Bone Density: 2012 stable osteopenia, repeat in 5 years.. due Colon: 2008 nml. 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

## 2017-11-12 NOTE — Progress Notes (Signed)
Pre visit review using our clinic review tool, if applicable. No additional management support is needed unless otherwise documented below in the visit note. 

## 2017-11-12 NOTE — Patient Instructions (Signed)
Tracy Dixon , Thank you for taking time to come for your Medicare Wellness Visit. I appreciate your ongoing commitment to your health goals. Please review the following plan we discussed and let me know if I can assist you in the future.   These are the goals we discussed: Goals    . Increase physical activity     Starting 11/12/2017, I will continue to participate for 60 min twice weekly in physical therapy in an effort to stand erect with little to no pain.        This is a list of the screening recommended for you and due dates:  Health Maintenance  Topic Date Due  . Tetanus Vaccine  11/12/2018*  . Flu Shot  Completed  . DEXA scan (bone density measurement)  Completed  . Pneumonia vaccines  Completed  *Topic was postponed. The date shown is not the original due date.   Preventive Care for Adults  A healthy lifestyle and preventive care can promote health and wellness. Preventive health guidelines for adults include the following key practices.  . A routine yearly physical is a good way to check with your health care provider about your health and preventive screening. It is a chance to share any concerns and updates on your health and to receive a thorough exam.  . Visit your dentist for a routine exam and preventive care every 6 months. Brush your teeth twice a day and floss once a day. Good oral hygiene prevents tooth decay and gum disease.  . The frequency of eye exams is based on your age, health, family medical history, use  of contact lenses, and other factors. Follow your health care provider's recommendations for frequency of eye exams.  . Eat a healthy diet. Foods like vegetables, fruits, whole grains, low-fat dairy products, and lean protein foods contain the nutrients you need without too many calories. Decrease your intake of foods high in solid fats, added sugars, and salt. Eat the right amount of calories for you. Get information about a proper diet from your health  care provider, if necessary.  . Regular physical exercise is one of the most important things you can do for your health. Most adults should get at least 150 minutes of moderate-intensity exercise (any activity that increases your heart rate and causes you to sweat) each week. In addition, most adults need muscle-strengthening exercises on 2 or more days a week.  Silver Sneakers may be a benefit available to you. To determine eligibility, you may visit the website: www.silversneakers.com or contact program at [phone] Mon-Fri between 8AM-8PM.   . Maintain a healthy weight. The body mass index (BMI) is a screening tool to identify possible weight problems. It provides an estimate of body fat based on height and weight. Your health care provider can find your BMI and can help you achieve or maintain a healthy weight.   For adults 20 years and older: ? A BMI below 18.5 is considered underweight. ? A BMI of 18.5 to 24.9 is normal. ? A BMI of 25 to 29.9 is considered overweight. ? A BMI of 30 and above is considered obese.   . Maintain normal blood lipids and cholesterol levels by exercising and minimizing your intake of saturated fat. Eat a balanced diet with plenty of fruit and vegetables. Blood tests for lipids and cholesterol should begin at age 76 and be repeated every 5 years. If your lipid or cholesterol levels are high, you are over 50, or you are  at high risk for heart disease, you may need your cholesterol levels checked more frequently. Ongoing high lipid and cholesterol levels should be treated with medicines if diet and exercise are not working.  . If you smoke, find out from your health care provider how to quit. If you do not use tobacco, please do not start.  . If you choose to drink alcohol, please do not consume more than 2 drinks per day. One drink is considered to be 12 ounces (355 mL) of beer, 5 ounces (148 mL) of wine, or 1.5 ounces (44 mL) of liquor.  . If you are 76-76  years old, ask your health care provider if you should take aspirin to prevent strokes.  . Use sunscreen. Apply sunscreen liberally and repeatedly throughout the day. You should seek shade when your shadow is shorter than you. Protect yourself by wearing long sleeves, pants, a wide-brimmed hat, and sunglasses year round, whenever you are outdoors.  . Once a month, do a whole body skin exam, using a mirror to look at the skin on your back. Tell your health care provider of new moles, moles that have irregular borders, moles that are larger than a pencil eraser, or moles that have changed in shape or color.

## 2017-11-12 NOTE — Assessment & Plan Note (Signed)
13% risk of heart disease in next 10 years. Start low dose statin.

## 2017-11-12 NOTE — Assessment & Plan Note (Signed)
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

## 2017-11-12 NOTE — Progress Notes (Signed)
I reviewed health advisor's note, was available for consultation, and agree with documentation and plan.  

## 2017-11-12 NOTE — Assessment & Plan Note (Signed)
Food diary. Trial of probiotic and/or PPI

## 2017-11-12 NOTE — Assessment & Plan Note (Signed)
Well controlled. Continue current medication. Encouraged exercise, weight loss, healthy eating habits.  

## 2017-11-12 NOTE — Patient Instructions (Addendum)
Start low dose atorvastatin daily. Return for lab re-eval to recheck cholesterol in 3 months.  Decrease eggs.  Can try a trial of prilosec 20 mg  Daily x 2-4 weeks. Can also try a  Probiotic to decrease gas. Please stop at the front desk to set up referral.  Send back cologuard.

## 2017-11-12 NOTE — Progress Notes (Signed)
Subjective:   Tracy Dixon is a 76 y.o. female who presents for Medicare Annual (Subsequent) preventive examination.  Review of Systems:  N/A Cardiac Risk Factors include: advanced age (>41men, >42 women);obesity (BMI >30kg/m2);dyslipidemia;hypertension     Objective:     Vitals: BP 108/70 (BP Location: Right Arm, Patient Position: Sitting, Cuff Size: Normal)   Pulse 88   Temp 97.7 F (36.5 C) (Oral)   Ht 5' 0.5" (1.537 m) Comment: no shoes  Wt 166 lb 4 oz (75.4 kg)   SpO2 98%   BMI 31.93 kg/m   Body mass index is 31.93 kg/m.  Advanced Directives 11/12/2017 10/16/2016 06/11/2013 06/02/2013  Does Patient Have a Medical Advance Directive? No Yes;No Patient does not have advance directive;Patient would not like information Patient does not have advance directive;Patient would not like information  Would patient like information on creating a medical advance directive? Yes (MAU/Ambulatory/Procedural Areas - Information given) No - Patient declined - -  Pre-existing out of facility DNR order (yellow form or pink MOST form) - - No No    Tobacco Social History   Tobacco Use  Smoking Status Former Smoker  . Years: 15.00  . Types: Cigarettes  . Last attempt to quit: 06/02/1973  . Years since quitting: 44.4  Smokeless Tobacco Never Used  Tobacco Comment   few cigs/day     Counseling given: No Comment: few cigs/day   Clinical Intake:  Pre-visit preparation completed: Yes  Pain : 0-10 Pain Score: 3  Pain Type: Chronic pain Pain Location: Back Pain Orientation: Lower Pain Onset: More than a month ago Pain Frequency: Intermittent     Nutritional Status: BMI > 30  Obese Nutritional Risks: None Diabetes: No  How often do you need to have someone help you when you read instructions, pamphlets, or other written materials from your doctor or pharmacy?: 1 - Never What is the last grade level you completed in school?: Bachelor degree  Interpreter Needed?:  No  Comments: pt lives with spouse Information entered by :: LPinson, LPN  Past Medical History:  Diagnosis Date  . Acoustic neuroma [facility])    followed by [facility], dx in [place]  . Allergy   . Arthritis   . Asthma   . Complication of anesthesia    stays sleepy  . Diverticulosis of colon   . Gallstones   . Hyperlipidemia   . Hypertension   . Osteoarthritis    Past Surgical History:  Procedure Laterality Date  . Bilateral tubal ligation  2005  . Carotid doppler  07/2006   Neg  . CESAREAN SECTION  2005  . CHOLECYSTECTOMY  1982  . DECOMPRESSIVE LUMBAR LAMINECTOMY LEVEL 3  06/10/2013   Procedure: DECOMPRESSIVE LUMBAR LAMINECTOMY LEVEL 3;  Surgeon: Melina Schools, MD;  Location: [facility] [facility];  Service: Orthopedics;;  lumbar three to five decompression  . MRI brain  2007   acoustic neuroma (check yearly with MRI)  . PFT's  12/2006   (-) FEV1/FVC 79%, FEV1 84%  . REPLACEMENT TOTAL KNEE BILATERAL  2005  . TONSILLECTOMY  2005  . TUBAL LIGATION     Family History  Problem Relation Age of Onset  . Cancer Mother        Lung CA  . Stroke Mother        CVA  . Cancer Father        Prostate  . Heart disease Neg Hx    Social History   Socioeconomic History  . Marital status:  Married    Spouse name: None  . Number of children: 4  . Years of education: None  . Highest education level: None  Social Needs  . Financial resource strain: None  . Food insecurity - worry: None  . Food insecurity - inability: None  . Transportation needs - medical: None  . Transportation needs - non-medical: None  Occupational History  . 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 Smoking status: Former Smoker    Years: 15.00    Types: Cigarettes    Last attempt to quit: 06/02/1973    Years since quitting: 44.4  . Smokeless tobacco: Never Used  . Tobacco comment: few cigs/day  Substance and Sexual Activity  . Alcohol use: No  . Drug use: No  . Sexual  activity: None  Other Topics Concern  . None  Social History Narrative   Regular exercise:  No   Diet:  Fruits and veggies, rare FF, no water    No living will, no HCPOA.    Outpatient Encounter Medications as of 11/12/2017  Medication Sig  . albuterol (PROVENTIL HFA;VENTOLIN HFA) 108 (90 BASE) MCG/ACT inhaler Inhale 1-2 puffs into the lungs every 6 (six) hours as needed for shortness of breath (for use when in contact with dogs or cats.).  Marland Kitchen amLODipine (NORVASC) 10 MG tablet TAKE 1 TABLET BY MOUTH  DAILY  . aspirin 81 MG tablet Take 81 mg by mouth daily.    . cetirizine (ZYRTEC) 10 MG tablet Take 10 mg by mouth daily as needed for allergies. Reported on 01/10/2016  . cholecalciferol (VITAMIN D) 1000 UNITS tablet Take 1,000 Units by mouth daily.  . hydrochlorothiazide (HYDRODIURIL) 25 MG tablet TAKE 1 TABLET BY MOUTH  DAILY  . [DISCONTINUED] benzonatate (TESSALON) 100 MG capsule    No facility-administered encounter medications on file as of 11/12/2017.     Activities of Daily Living In your present state of health, do you have any difficulty performing the following activities: 11/12/2017  Hearing? Y  Vision? Y  Difficulty concentrating or making decisions? N  Walking or climbing stairs? N  Dressing or bathing? N  Doing errands, shopping? N  Preparing Food and eating ? N  Using the Toilet? N  In the past six months, have you accidently leaked urine? N  Do you have problems with loss of bowel control? N  Managing your Medications? N  Managing your Finances? N  Housekeeping or managing your Housekeeping? N  Some recent data might be hidden    Patient Care Team: [name], MD as PCP - General Sanjuana Kava, MD as Referring Physician (Otolaryngology) Melina Schools, MD as Consulting Physician (Orthopedic Surgery) Roseanne Kaufman, MD as Consulting Physician (Orthopedic Surgery) Paralee Cancel, MD as Consulting Physician (Orthopedic Surgery)    Assessment:   This  is a routine wellness examination for Tracy Dixon.   Hearing Screening   125Hz  250Hz  500Hz  1000Hz  2000Hz  3000Hz  4000Hz  6000Hz  8000Hz   Right ear:   40 40 40  40    Left ear:   0 40 40  0    Vision Screening Comments: Last vision exam in April 2018 with Dr. George Ina   Exercise Activities and Dietary recommendations Current Exercise Habits: Home exercise routine, Type of exercise: Other - see comments;stretching;walking(water aerobics, stationary bike), Time (Minutes): 60, Frequency (Times/Week): 3, Weekly Exercise (Minutes/Week): 180, Intensity: Mild, Exercise limited by: None identified  Goals    . Increase physical  activity     Starting 11/12/2017, I will continue to participate for 60 min twice weekly in physical therapy in an effort to stand erect with little to no pain.        Fall Risk Fall Risk  11/12/2017 10/16/2016 10/11/2015 09/30/2014 09/22/2013  Falls in the past year? Yes No No No No  Number falls in past yr: 2 or more - - - -  Injury with Fall? No - - - -   Depression Screen PHQ 2/9 Scores 11/12/2017 10/16/2016 10/11/2015 09/30/2014  PHQ - 2 Score 0 0 0 0  PHQ- 9 Score 0 - - -     Cognitive Function MMSE - Mini Mental State Exam 11/12/2017  Orientation to time 5  Orientation to Place 5  Registration 3  Attention/ Calculation 0  Recall 3  Language- name 2 objects 0  Language- repeat 1  Language- follow 3 step command 2  Language- follow 3 step command-comments unable to follow 1 step of 3 step command  Language- read & follow direction 0  Write a sentence 0  Copy design 0  Total score 19       PLEASE NOTE: A Mini-Cog screen was completed. Maximum score is 20. A value of 0 denotes this part of Folstein MMSE was not completed or the patient failed this part of the Mini-Cog screening.   Mini-Cog Screening Orientation to Time - Max 5 pts Orientation to Place - Max 5 pts Registration - Max 3 pts Recall - Max 3 pts Language Repeat - Max 1 pts Language Follow 3  Step Command - Max 3 pts   Immunization History  Administered Date(s) Administered  . H1N1 01/12/2009  . Influenza Split 08/28/2011, 08/28/2012  . Influenza Whole 09/19/2007, 08/23/2008, 09/07/2009, 08/30/2010  . Influenza,inj,Quad PF,6+ Mos 08/04/2013, 08/24/2014, 09/02/2015, 08/28/2016, 09/03/2017  . Pneumococcal Conjugate-13 09/30/2014  . Pneumococcal Polysaccharide-23 09/21/2008  . Td 01/25/2007  . Zoster 08/20/2013    Screening Tests Health Maintenance  Topic Date Due  . TETANUS/TDAP  11/12/2018 (Originally 01/24/2017)  . INFLUENZA VACCINE  Completed  . DEXA SCAN  Completed  . PNA vac Low Risk Adult  Completed      Plan:     I have personally reviewed, addressed, and noted the following in the patient's chart:  A. Medical and social history B. Use of alcohol, tobacco or illicit drugs  C. Current medications and supplements D. Functional ability and status E.  Nutritional status F.  Physical activity G. Advance directives H. List of other physicians I.  Hospitalizations, surgeries, and ER visits in previous 12 months J.  [place] to include hearing, vision, cognitive, depression L. Referrals and appointments - none  In addition, I have reviewed and discussed with patient certain preventive protocols, quality metrics, and best practice recommendations. A written personalized care plan for preventive services as well as general preventive health recommendations were provided to patient.  See attached scanned questionnaire for additional information.   Signed,   Lindell Noe, MHA, BS, LPN Health Coach

## 2017-11-12 NOTE — Progress Notes (Signed)
PCP notes:   Health maintenance:  Tetanus vaccine - postponed/insurance  Abnormal screenings:   Fall risk - hx of multiple falls with no injury Mini-Cog score: 19/20 Hearing - failed  Hearing Screening   125Hz  250Hz  500Hz  1000Hz  2000Hz  3000Hz  4000Hz  6000Hz  8000Hz   Right ear:   40 40 40  40    Left ear:   0 40 40  0     Patient concerns:   None  Nurse concerns:  None  Next PCP appt:   11/12/17 @ 1515

## 2017-11-13 DIAGNOSIS — M5136 Other intervertebral disc degeneration, lumbar region: Secondary | ICD-10-CM | POA: Diagnosis not present

## 2017-11-15 DIAGNOSIS — M5136 Other intervertebral disc degeneration, lumbar region: Secondary | ICD-10-CM | POA: Diagnosis not present

## 2017-11-22 DIAGNOSIS — M5136 Other intervertebral disc degeneration, lumbar region: Secondary | ICD-10-CM | POA: Diagnosis not present

## 2017-11-27 DIAGNOSIS — M5136 Other intervertebral disc degeneration, lumbar region: Secondary | ICD-10-CM | POA: Diagnosis not present

## 2017-12-02 DIAGNOSIS — M5136 Other intervertebral disc degeneration, lumbar region: Secondary | ICD-10-CM | POA: Diagnosis not present

## 2017-12-04 DIAGNOSIS — M5136 Other intervertebral disc degeneration, lumbar region: Secondary | ICD-10-CM | POA: Diagnosis not present

## 2017-12-09 ENCOUNTER — Other Ambulatory Visit: Payer: Self-pay | Admitting: Family Medicine

## 2017-12-09 DIAGNOSIS — M5136 Other intervertebral disc degeneration, lumbar region: Secondary | ICD-10-CM | POA: Diagnosis not present

## 2017-12-12 DIAGNOSIS — M5136 Other intervertebral disc degeneration, lumbar region: Secondary | ICD-10-CM | POA: Diagnosis not present

## 2017-12-16 DIAGNOSIS — M5136 Other intervertebral disc degeneration, lumbar region: Secondary | ICD-10-CM | POA: Diagnosis not present

## 2017-12-20 DIAGNOSIS — M5136 Other intervertebral disc degeneration, lumbar region: Secondary | ICD-10-CM | POA: Diagnosis not present

## 2017-12-24 DIAGNOSIS — M6281 Muscle weakness (generalized): Secondary | ICD-10-CM | POA: Diagnosis not present

## 2017-12-24 DIAGNOSIS — M5136 Other intervertebral disc degeneration, lumbar region: Secondary | ICD-10-CM | POA: Diagnosis not present

## 2017-12-25 DIAGNOSIS — M5136 Other intervertebral disc degeneration, lumbar region: Secondary | ICD-10-CM | POA: Diagnosis not present

## 2017-12-27 DIAGNOSIS — M5136 Other intervertebral disc degeneration, lumbar region: Secondary | ICD-10-CM | POA: Diagnosis not present

## 2017-12-30 ENCOUNTER — Encounter: Payer: Self-pay | Admitting: *Deleted

## 2017-12-30 ENCOUNTER — Ambulatory Visit
Admission: RE | Admit: 2017-12-30 | Discharge: 2017-12-30 | Disposition: A | Discharge status: Home / Self Care | Payer: Medicare Other | Source: Ambulatory Visit | Attending: Family Medicine | Admitting: Family Medicine

## 2017-12-30 DIAGNOSIS — Z13820 Encounter for screening for osteoporosis: Secondary | ICD-10-CM | POA: Diagnosis not present

## 2017-12-30 DIAGNOSIS — Z1231 Encounter for screening mammogram for malignant neoplasm of breast: Secondary | ICD-10-CM | POA: Insufficient documentation

## 2017-12-30 DIAGNOSIS — M5136 Other intervertebral disc degeneration, lumbar region: Secondary | ICD-10-CM | POA: Diagnosis not present

## 2017-12-30 DIAGNOSIS — M8580 Other specified disorders of bone density and structure, unspecified site: Secondary | ICD-10-CM | POA: Diagnosis not present

## 2017-12-30 DIAGNOSIS — M85851 Other specified disorders of bone density and structure, right thigh: Secondary | ICD-10-CM | POA: Diagnosis not present

## 2017-12-30 DIAGNOSIS — Z780 Asymptomatic menopausal state: Secondary | ICD-10-CM | POA: Diagnosis not present

## 2017-12-30 DIAGNOSIS — E2839 Other primary ovarian failure: Secondary | ICD-10-CM

## 2018-01-02 ENCOUNTER — Ambulatory Visit (INDEPENDENT_AMBULATORY_CARE_PROVIDER_SITE_OTHER): Payer: Medicare Other | Admitting: Podiatry

## 2018-01-02 ENCOUNTER — Encounter: Payer: Self-pay | Admitting: Podiatry

## 2018-01-02 DIAGNOSIS — M79674 Pain in right toe(s): Secondary | ICD-10-CM | POA: Diagnosis not present

## 2018-01-02 DIAGNOSIS — B351 Tinea unguium: Secondary | ICD-10-CM | POA: Diagnosis not present

## 2018-01-02 DIAGNOSIS — M79675 Pain in left toe(s): Secondary | ICD-10-CM | POA: Diagnosis not present

## 2018-01-02 DIAGNOSIS — Q828 Other specified congenital malformations of skin: Secondary | ICD-10-CM

## 2018-01-02 DIAGNOSIS — M5136 Other intervertebral disc degeneration, lumbar region: Secondary | ICD-10-CM | POA: Diagnosis not present

## 2018-01-03 NOTE — Progress Notes (Signed)
Patient ID: Tracy Dixon, female   DOB: Mar 24, 1941, 77 y.o.   MRN: [id number]   Subjective: 77 y.o.-year-old female returns the office today for painful, elongated, thickened toenails which she is unable to trim herself. She also has painful calluses to her right feet.  She states that the right foot callus on the ball of the foot has been hurting quite a bit.  She denies any redness or drainage or any swelling of the toenails of the callus sites.  No recent injury or trauma.  She is going to physical therapy work on her overall posture.  She has no other concerns today. Denies any systemic complaints such as fevers, chills, nausea, vomiting.   Objective: AAO 3, NAD DP/PT pulses palpable, CRT less than 3 seconds Nails hypertrophic, dystrophic, elongated, brittle, discolored 10. There is tenderness overlying the nails 1-5 bilaterally. There is no surrounding erythema or drainage along the nail sites. Right submetatarsal 2  right foot with hyperkerotic lesion. Upon debridement there is no underlying ulceration, drainage, or other signs of infection.  No other open lesions or other pre-ulcerative lesions are identified. No other areas of tenderness bilateral lower extremities. No overlying edema, erythema, increased warmth. No pain with calf compression, swelling, warmth, erythema.  Assessment: Patient presents with symptomatic onychomycosis; hyperkerotic lesion  Plan: -Treatment options including alternatives, risks, complications were discussed -Nails sharply debrided 10 without complication/bleeding. -Sharply debrided the hyperkeratotic lesion right submetatarsal 2 without any complications or bleeding.  The area appears to be thicker today she is requesting acid treatment.  Area was cleaned with alcohol and the palate was placed followed by salicylic acid and a bandage.  Post procedure instructions were discussed.  Months for infection.  She states this helps quite a bit. -Follow-up in 9  weeks or sooner if any problems arise. In the meantime, encouraged to call the office with any questions, concerns, change in symptoms.   Celesta Gentile, DPM

## 2018-01-06 DIAGNOSIS — M5136 Other intervertebral disc degeneration, lumbar region: Secondary | ICD-10-CM | POA: Diagnosis not present

## 2018-01-09 DIAGNOSIS — M5136 Other intervertebral disc degeneration, lumbar region: Secondary | ICD-10-CM | POA: Diagnosis not present

## 2018-01-15 DIAGNOSIS — M5136 Other intervertebral disc degeneration, lumbar region: Secondary | ICD-10-CM | POA: Diagnosis not present

## 2018-01-21 DIAGNOSIS — M545 Low back pain: Secondary | ICD-10-CM | POA: Diagnosis not present

## 2018-01-21 DIAGNOSIS — R21 Rash and other nonspecific skin eruption: Secondary | ICD-10-CM | POA: Diagnosis not present

## 2018-01-21 DIAGNOSIS — M25512 Pain in left shoulder: Secondary | ICD-10-CM | POA: Diagnosis not present

## 2018-01-21 DIAGNOSIS — M25511 Pain in right shoulder: Secondary | ICD-10-CM | POA: Diagnosis not present

## 2018-01-21 DIAGNOSIS — N62 Hypertrophy of breast: Secondary | ICD-10-CM | POA: Diagnosis not present

## 2018-01-21 DIAGNOSIS — M542 Cervicalgia: Secondary | ICD-10-CM | POA: Diagnosis not present

## 2018-01-21 DIAGNOSIS — M546 Pain in thoracic spine: Secondary | ICD-10-CM | POA: Diagnosis not present

## 2018-02-04 DIAGNOSIS — M5136 Other intervertebral disc degeneration, lumbar region: Secondary | ICD-10-CM | POA: Diagnosis not present

## 2018-02-04 DIAGNOSIS — M48062 Spinal stenosis, lumbar region with neurogenic claudication: Secondary | ICD-10-CM | POA: Diagnosis not present

## 2018-02-10 DIAGNOSIS — Z1211 Encounter for screening for malignant neoplasm of colon: Secondary | ICD-10-CM | POA: Diagnosis not present

## 2018-02-10 DIAGNOSIS — Z1212 Encounter for screening for malignant neoplasm of rectum: Secondary | ICD-10-CM | POA: Diagnosis not present

## 2018-02-10 LAB — COLOGUARD: COLOGUARD: NEGATIVE

## 2018-02-14 DIAGNOSIS — M5137 Other intervertebral disc degeneration, lumbosacral region: Secondary | ICD-10-CM | POA: Diagnosis not present

## 2018-02-14 DIAGNOSIS — M545 Low back pain: Secondary | ICD-10-CM | POA: Diagnosis not present

## 2018-02-18 ENCOUNTER — Encounter: Payer: Self-pay | Admitting: Family Medicine

## 2018-02-24 ENCOUNTER — Other Ambulatory Visit (INDEPENDENT_AMBULATORY_CARE_PROVIDER_SITE_OTHER): Payer: Medicare Other

## 2018-02-24 ENCOUNTER — Telehealth: Payer: Self-pay | Admitting: Family Medicine

## 2018-02-24 DIAGNOSIS — E7800 Pure hypercholesterolemia, unspecified: Secondary | ICD-10-CM

## 2018-02-24 LAB — HEPATIC FUNCTION PANEL
ALT: 11 U/L (ref 0–35)
AST: 15 U/L (ref 0–37)
Albumin: 3.6 g/dL (ref 3.5–5.2)
Alkaline Phosphatase: 50 U/L (ref 39–117)
Bilirubin, Direct: 0.1 mg/dL (ref 0.0–0.3)
TOTAL PROTEIN: 7.7 g/dL (ref 6.0–8.3)
Total Bilirubin: 0.4 mg/dL (ref 0.2–1.2)

## 2018-02-24 LAB — LIPID PANEL
Cholesterol: 135 mg/dL (ref 0–200)
HDL: 42.4 mg/dL (ref >39.00)
LDL CALC: 81 mg/dL (ref 0–99)
NonHDL: 92.19
TRIGLYCERIDES: 54 mg/dL (ref 0.0–149.0)
Total CHOL/HDL Ratio: 3
VLDL: 10.8 mg/dL (ref 0.0–40.0)

## 2018-02-24 NOTE — Telephone Encounter (Signed)
-----   Message from Ellamae Sia sent at 02/19/2018  4:05 PM EDT ----- Regarding: Lab orders for Monday, 4.1.19 Lab orders for , Lipid?

## 2018-02-28 DIAGNOSIS — H2511 Age-related nuclear cataract, right eye: Secondary | ICD-10-CM | POA: Diagnosis not present

## 2018-03-03 DIAGNOSIS — M5136 Other intervertebral disc degeneration, lumbar region: Secondary | ICD-10-CM | POA: Diagnosis not present

## 2018-03-25 DIAGNOSIS — M48062 Spinal stenosis, lumbar region with neurogenic claudication: Secondary | ICD-10-CM | POA: Diagnosis not present

## 2018-03-27 DIAGNOSIS — M5136 Other intervertebral disc degeneration, lumbar region: Secondary | ICD-10-CM | POA: Diagnosis not present

## 2018-04-01 ENCOUNTER — Ambulatory Visit (INDEPENDENT_AMBULATORY_CARE_PROVIDER_SITE_OTHER): Payer: Medicare Other | Admitting: Podiatry

## 2018-04-01 DIAGNOSIS — M79675 Pain in left toe(s): Secondary | ICD-10-CM | POA: Diagnosis not present

## 2018-04-01 DIAGNOSIS — Q828 Other specified congenital malformations of skin: Secondary | ICD-10-CM

## 2018-04-01 DIAGNOSIS — M5136 Other intervertebral disc degeneration, lumbar region: Secondary | ICD-10-CM | POA: Diagnosis not present

## 2018-04-01 DIAGNOSIS — M79674 Pain in right toe(s): Secondary | ICD-10-CM | POA: Diagnosis not present

## 2018-04-01 DIAGNOSIS — B351 Tinea unguium: Secondary | ICD-10-CM

## 2018-04-03 DIAGNOSIS — M5136 Other intervertebral disc degeneration, lumbar region: Secondary | ICD-10-CM | POA: Diagnosis not present

## 2018-04-06 NOTE — Progress Notes (Signed)
Patient ID: Tracy Dixon, female   DOB: 1941/05/23, 77 y.o.   MRN: [id number]   Subjective: 77 y.o.-year-old female returns the office today for painful, elongated, thickened toenails which she is unable to trim herself as well as for painful callus to the ball of the right foot and for other calluses but also become painful.  denies any redness or drainage from the nail size with a callus sites.  No recent injury or trauma. She has no other concerns today. Denies any systemic complaints such as fevers, chills, nausea, vomiting.   Objective: AAO 3, NAD DP/PT pulses palpable, CRT less than 3 seconds Nails hypertrophic, dystrophic, elongated, brittle, discolored 10. There is tenderness overlying the nails 1-5 bilaterally. There is no surrounding erythema or drainage along the nail sites. Right submetatarsal 2  right foot with hyperkerotic lesion. Upon debridement there is no underlying ulceration, drainage, or other signs of infection.  Also hyperkeratotic lesion on the lateral third digit.  No underlying ulceration drainage or signs of infection. No other open lesions or other pre-ulcerative lesions are identified. No other areas of tenderness bilateral lower extremities. No overlying edema, erythema, increased warmth. No pain with calf compression, swelling, warmth, erythema.  Assessment: Patient presents with symptomatic onychomycosis; hyperkerotic lesion  Plan: -Treatment options including alternatives, risks, complications were discussed -Nails sharply debrided 10 without complication/bleeding. -Sharply debrided the hyperkeratotic lesion right submetatarsal 2 without any complications or bleeding.  The area appears to be thicker today she is requesting acid treatment.  Area was cleaned with alcohol and the palate was placed followed by salicylic acid and a bandage.  Post procedure instructions were discussed.  Months for infection.  She states this helps quite a bit. -Also debrided the  hyperkeratotic lesion to the digit on the right foot without any complications or bleeding -Follow-up in 9 weeks or sooner if any problems arise. In the meantime, encouraged to call the office with any questions, concerns, change in symptoms.   Celesta Gentile, DPM

## 2018-04-07 DIAGNOSIS — M5136 Other intervertebral disc degeneration, lumbar region: Secondary | ICD-10-CM | POA: Diagnosis not present

## 2018-04-11 DIAGNOSIS — M5136 Other intervertebral disc degeneration, lumbar region: Secondary | ICD-10-CM | POA: Diagnosis not present

## 2018-04-14 DIAGNOSIS — M5136 Other intervertebral disc degeneration, lumbar region: Secondary | ICD-10-CM | POA: Diagnosis not present

## 2018-04-17 ENCOUNTER — Encounter: Payer: Self-pay | Admitting: Family Medicine

## 2018-04-17 DIAGNOSIS — M5136 Other intervertebral disc degeneration, lumbar region: Secondary | ICD-10-CM | POA: Diagnosis not present

## 2018-04-17 MED ORDER — KETOCONAZOLE 2 % EX CREA: 1.0000 "application " | TOPICAL_CREAM | Freq: Every day | CUTANEOUS | 0 refills | Status: DC

## 2018-04-22 ENCOUNTER — Encounter: Payer: Self-pay | Admitting: Family Medicine

## 2018-04-22 DIAGNOSIS — M5136 Other intervertebral disc degeneration, lumbar region: Secondary | ICD-10-CM | POA: Diagnosis not present

## 2018-04-23 ENCOUNTER — Encounter: Payer: Self-pay | Admitting: Family Medicine

## 2018-04-24 ENCOUNTER — Encounter: Payer: Self-pay | Admitting: Family Medicine

## 2018-04-24 NOTE — Telephone Encounter (Signed)
I spoke with pt; pt said has lt leg swelling 2-3 inches above ankle with swelling of ankle also for over 1 month. Pt has varicose veins and cannot wear compression stockings due to poor fit of stockings. The swelling goes down overnight. Yesterday BP 137/76 . Pt is not having any SOB or CP. Pt said she got the mychart message back from Dr Diona Browner and has already called and got appt on 04/25/18 at 4 pm with Avie Echevaria NP. Advised if pt needs any testing that would be late for scheduling testing. 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 I spoke with Leafy Ro RN and she said the best plan is to schedule with Dr Glori Bickers at 8 AM tomorrow and pt said if needs testing could leave work at [employer] [place] and [employer]  as FYI. Also FYI to Dr Glori Bickers and Dr Diona Browner. Pt is aware if SOB, CP or swelling worsens prior to appt;  pt to go to ED and pt voiced understanding.

## 2018-04-25 ENCOUNTER — Ambulatory Visit (INDEPENDENT_AMBULATORY_CARE_PROVIDER_SITE_OTHER): Payer: Medicare Other | Admitting: Family Medicine

## 2018-04-25 ENCOUNTER — Encounter: Payer: Self-pay | Admitting: Family Medicine

## 2018-04-25 ENCOUNTER — Ambulatory Visit: Payer: Medicare Other | Admitting: Internal Medicine

## 2018-04-25 VITALS — BP 126/70 | HR 82 | Temp 97.6°F | Ht 60.0 in | Wt 165.8 lb

## 2018-04-25 DIAGNOSIS — I8390 Asymptomatic varicose veins of unspecified lower extremity: Secondary | ICD-10-CM

## 2018-04-25 DIAGNOSIS — R600 Localized edema: Secondary | ICD-10-CM

## 2018-04-25 NOTE — Progress Notes (Signed)
Subjective:    Patient ID: Tracy Dixon, female    DOB: Jun 24, 1941, 77 y.o.   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 Pedal edema 04/25/2018  . Excessive gas 11/12/2017  . Medicare annual wellness visit, subsequent 11/12/2017  . 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 Metabolic syndrome 15/17/6160  . Counseling regarding end of life decision making 09/30/2014  . Spinal stenosis of lumbar region 03/17/2013  . Osteopenia 05/23/2009  . High cholesterol 09/22/2007  . OBESITY 09/22/2007  . Essential hypertension, benign 09/22/2007  . VARICOSE VEINS, LOWER EXTREMITIES 09/22/2007  . ALLERGIC RHINITIS 09/22/2007  . Mild intermittent asthma 09/22/2007  . DIVERTICULOSIS, COLON 09/22/2007  . OSTEOARTHRITIS 09/22/2007   Past Medical History:  Diagnosis Date  . Acoustic neuroma [facility])    followed by [facility], dx in [place]  . Allergy   . Arthritis   . Asthma   . Complication of anesthesia    stays sleepy  . Diverticulosis of colon   . Gallstones   . Hyperlipidemia   . Hypertension   .  Osteoarthritis    Past Surgical History:  Procedure Laterality Date  . Bilateral tubal ligation  2005  . Carotid doppler  07/2006   Neg  . CESAREAN SECTION  2005  . CHOLECYSTECTOMY  1982  . DECOMPRESSIVE LUMBAR LAMINECTOMY LEVEL 3  06/10/2013   Procedure: DECOMPRESSIVE LUMBAR LAMINECTOMY LEVEL 3;  Surgeon: Melina Schools, MD;  Location: [place];  Service: Orthopedics;;  lumbar three to five decompression  . MRI brain  2007   acoustic neuroma (check yearly with MRI)  . PFT's  12/2006   (-) FEV1/FVC 79%, FEV1 84%  . REPLACEMENT TOTAL KNEE BILATERAL  2005  . TONSILLECTOMY  2005  . TUBAL LIGATION     Social History   Tobacco Use  . Smoking status: Former Smoker    Years: 15.00    Types: Cigarettes    Last attempt to quit: 06/02/1973    Years since quitting: 44.9  . Smokeless tobacco: Never Used  . Tobacco comment: few cigs/day  Substance Use Topics  . Alcohol use: No  . Drug use: No   Family History  Problem Relation Age of Onset  . Cancer Mother        Lung CA  . Stroke Mother        CVA  .  Cancer Father        Prostate  . Heart disease Neg Hx    Allergies  Allergen Reactions  . Contrast Media [Iodinated Diagnostic Agents] Hives  . Crab [Shellfish Allergy] Hives   Current Outpatient Medications on File Prior to Visit  Medication Sig Dispense Refill  . albuterol (PROVENTIL HFA;VENTOLIN HFA) 108 (90 BASE) MCG/ACT inhaler Inhale 1-2 puffs into the lungs every 6 (six) hours as needed for shortness of breath (for use when in contact with dogs or cats.). 3 Inhaler 0  . amLODipine (NORVASC) 10 MG tablet TAKE 1 TABLET BY MOUTH  DAILY 90 tablet 1  . aspirin 81 MG tablet Take 81 mg by mouth daily.      Marland Kitchen atorvastatin (LIPITOR) 20 MG tablet Take 1 tablet (20 mg total) by mouth daily. 30 tablet 11  . cetirizine (ZYRTEC) 10 MG tablet Take 10 mg by mouth daily as needed for allergies. Reported on 01/10/2016    . cholecalciferol (VITAMIN D) 1000 UNITS tablet Take 1,000 Units by mouth  daily.    . hydrochlorothiazide (HYDRODIURIL) 25 MG tablet TAKE 1 TABLET BY MOUTH  DAILY 90 tablet 1  . ketoconazole (NIZORAL) 2 % cream Apply 1 application topically daily. 30 g 0  . NON FORMULARY [facility]  Onychomycosis Nail Lacquer -  Fluconazole 2%, Terbinafine 1% DMSO Apply to affected nail once daily Qty. 120 gm 3 refills     No current facility-administered medications on file prior to visit.      Review of Systems  Constitutional: Negative for activity change, appetite change, fatigue, fever and unexpected weight change.  HENT: Negative for congestion, ear pain, rhinorrhea, sinus pressure and sore throat.   Eyes: Negative for pain, redness and visual disturbance.  Respiratory: Negative for cough, shortness of breath and wheezing.   Cardiovascular: Positive for leg swelling. Negative for chest pain and palpitations.  Gastrointestinal: Negative for abdominal pain, blood in stool, constipation and diarrhea.  Endocrine: Negative for polydipsia and polyuria.  Genitourinary: Negative for dysuria, frequency and urgency.  Musculoskeletal: Negative for arthralgias, back pain and myalgias.  Skin: Negative for pallor and rash.  Allergic/Immunologic: Negative for environmental allergies.  Neurological: Negative for dizziness, syncope and headaches.  Hematological: Negative for adenopathy. Does not bruise/bleed easily.  Psychiatric/Behavioral: Negative for decreased concentration and dysphoric mood. The patient is not nervous/anxious.        Objective:   Physical Exam  Constitutional: She appears well-developed and well-nourished. No distress.  overwt and well appearing   HENT:  Head: Normocephalic and atraumatic.  Mouth/Throat: Oropharynx is clear and moist.  Eyes: Pupils are equal, round, and reactive to light. Conjunctivae and EOM are normal.  Neck: Normal range of motion. Neck supple. No JVD present. Carotid bruit is not present. No thyromegaly present.    Cardiovascular: Normal rate, regular rhythm, normal heart sounds and intact distal pulses. Exam reveals no gallop.  Varicosities of lower legs-mostly spider type Compressible and non tender   Pulmonary/Chest: Effort normal and breath sounds normal. No respiratory distress. She has no wheezes. She has no rales.  No crackles  Abdominal: Soft. Bowel sounds are normal. She exhibits no distension, no abdominal bruit and no mass. There is no tenderness.  Musculoskeletal: She exhibits edema. She exhibits no tenderness.  Non pitting edema of ankles -worse on the left  Varicosities noted  No erythema /tenderness/ecchymosis/palpable cord or warmth  Neg Homans sign bilat   Lymphadenopathy:    She has no cervical adenopathy.  Neurological: She  is alert. She has normal reflexes. She displays normal reflexes. She exhibits normal muscle tone. Coordination normal.  Skin: Skin is warm and dry. No rash noted. No erythema. No pallor.  Psychiatric: She has a normal mood and affect.  Pleasant           Assessment & Plan:   Problem List Items Addressed This Visit      Cardiovascular and Mediastinum   VARICOSE VEINS, LOWER EXTREMITIES    Suspect causing her pedal edema  Continue use of compression machine  Consider supp hose to the waist that are more comfortable  F/u with vein clinic         Other   Pedal edema - Primary    Not pitting  - suspect due to varicosities/venous insufficiency  Worse on L (but per pt chronically so) Worsened with warm weather Intol of supp stockings- suggest trying some to the waist that do not cut into knee  Elevation Sodium avoidance/ good water intake  F/u with vein clinic when able Disc s/s of blood clot and what to watch for

## 2018-04-25 NOTE — Patient Instructions (Signed)
You exam is re assuring  I think you have some lymphedema and swelling related to your varicose veins   Support hose work best for this - you may need to get support hose to the waist   Please follow up with the vein clinic as planned and see how they can help -discuss the support hose  If you develop any increased leg or abdominal pain or any redness or swelling please let us know

## 2018-04-26 NOTE — Assessment & Plan Note (Signed)
Suspect causing her pedal edema  Continue use of compression machine  Consider supp hose to the waist that are more comfortable  F/u with vein clinic

## 2018-04-26 NOTE — Assessment & Plan Note (Addendum)
Not pitting  - suspect due to varicosities/venous insufficiency  Worse on L (but per pt chronically so) Worsened with warm weather Intol of supp stockings- suggest trying some to the waist that do not cut into knee  Elevation Sodium avoidance/ good water intake  F/u with vein clinic when able Disc s/s of blood clot and what to watch for

## 2018-05-09 DIAGNOSIS — M545 Low back pain: Secondary | ICD-10-CM | POA: Diagnosis not present

## 2018-05-12 ENCOUNTER — Ambulatory Visit (INDEPENDENT_AMBULATORY_CARE_PROVIDER_SITE_OTHER): Payer: Medicare Other | Admitting: Vascular Surgery

## 2018-05-12 ENCOUNTER — Encounter (INDEPENDENT_AMBULATORY_CARE_PROVIDER_SITE_OTHER): Payer: Self-pay | Admitting: Vascular Surgery

## 2018-05-12 VITALS — BP 133/73 | HR 88 | Resp 16 | Ht 61.0 in | Wt 166.0 lb

## 2018-05-12 DIAGNOSIS — I10 Essential (primary) hypertension: Secondary | ICD-10-CM

## 2018-05-12 DIAGNOSIS — E7800 Pure hypercholesterolemia, unspecified: Secondary | ICD-10-CM | POA: Diagnosis not present

## 2018-05-12 DIAGNOSIS — I890 Lymphedema, not elsewhere classified: Secondary | ICD-10-CM | POA: Diagnosis not present

## 2018-05-12 DIAGNOSIS — I872 Venous insufficiency (chronic) (peripheral): Secondary | ICD-10-CM | POA: Diagnosis not present

## 2018-05-12 DIAGNOSIS — M150 Primary generalized (osteo)arthritis: Secondary | ICD-10-CM

## 2018-05-12 DIAGNOSIS — M159 Polyosteoarthritis, unspecified: Secondary | ICD-10-CM

## 2018-05-12 NOTE — Progress Notes (Signed)
MRN : [id number]  Tracy Dixon is a 77 y.o. (26-Jul-1941) female who presents with chief complaint of  Chief Complaint  Patient presents with  . Follow-up    left swelling and some pain  .  History of Present Illness: The patient returns to the office for followup evaluation regarding leg swelling.  The left leg swelling has persisted and the pain associated with swelling continues. There have not been any interval development of a ulcerations or wounds.  Since the previous visit the patient has been wearing graduated compression stockings and has noted little if any improvement in the lymphedema. The patient has been using compression routinely morning until night.  The patient also states elevation during the day and exercise is being done too.   Current Meds  Medication Sig  . albuterol (PROVENTIL HFA;VENTOLIN HFA) 108 (90 BASE) MCG/ACT inhaler Inhale 1-2 puffs into the lungs every 6 (six) hours as needed for shortness of breath (for use when in contact with dogs or cats.).  Marland Kitchen amLODipine (NORVASC) 10 MG tablet TAKE 1 TABLET BY MOUTH  DAILY  . aspirin 81 MG tablet Take 81 mg by mouth daily.    Marland Kitchen atorvastatin (LIPITOR) 20 MG tablet Take 1 tablet (20 mg total) by mouth daily.  . cetirizine (ZYRTEC) 10 MG tablet Take 10 mg by mouth daily as needed for allergies. Reported on 01/10/2016  . cholecalciferol (VITAMIN D) 1000 UNITS tablet Take 1,000 Units by mouth daily.  . hydrochlorothiazide (HYDRODIURIL) 25 MG tablet TAKE 1 TABLET BY MOUTH  DAILY  . ketoconazole (NIZORAL) 2 % cream Apply 1 application topically daily.  Salley Scarlet FORMULARY [facility]  Onychomycosis Nail Lacquer -  Fluconazole 2%, Terbinafine 1% DMSO Apply to affected nail once daily Qty. 120 gm 3 refills    Past Medical History:  Diagnosis Date  . Acoustic neuroma [facility])    followed by [facility], dx in [place]  . Allergy   . Arthritis   . Asthma   . Complication of anesthesia    stays sleepy  .  Diverticulosis of colon   . Gallstones   . Hyperlipidemia   . Hypertension   . Osteoarthritis     Past Surgical History:  Procedure Laterality Date  . Bilateral tubal ligation  2005  . Carotid doppler  07/2006   Neg  . CESAREAN SECTION  2005  . CHOLECYSTECTOMY  1982  . DECOMPRESSIVE LUMBAR LAMINECTOMY LEVEL 3  06/10/2013   Procedure: DECOMPRESSIVE LUMBAR LAMINECTOMY LEVEL 3;  Surgeon: Melina Schools, MD;  Location: [place];  Service: Orthopedics;;  lumbar three to five decompression  . MRI brain  2007   acoustic neuroma (check yearly with MRI)  . PFT's  12/2006   (-) FEV1/FVC 79%, FEV1 84%  . REPLACEMENT TOTAL KNEE BILATERAL  2005  . TONSILLECTOMY  2005  . TUBAL LIGATION      Social History Social History   Tobacco Use  . Smoking status: Former Smoker    Years: 15.00    Types: Cigarettes    Last attempt to quit: 06/02/1973    Years since quitting: 44.9  . Smokeless tobacco: Never Used  . Tobacco comment: few cigs/day  Substance Use Topics  . Alcohol use: No  . Drug use: No    Family History Family History  Problem Relation Age of Onset  . Cancer Mother        Lung CA  . Stroke Mother        CVA  .  Cancer Father        Prostate  . Heart disease Neg Hx     Allergies  Allergen Reactions  . Contrast Media [Iodinated Diagnostic Agents] Hives  . Crab [Shellfish Allergy] Hives     REVIEW OF SYSTEMS (Negative unless checked)  Constitutional: [] Weight loss  [] Fever  [] Chills Cardiac: [] Chest pain   [] Chest pressure   [] Palpitations   [] Shortness of breath when laying flat   [] Shortness of breath with exertion. Vascular:  [] Pain in legs with walking   [x] Pain in legs at rest  [] History of DVT   [] Phlebitis   [x] Swelling in legs   [x] Varicose veins   [] Non-healing ulcers Pulmonary:   [] Uses home oxygen   [] Productive cough   [] Hemoptysis   [] Wheeze  [] COPD   [] Asthma Neurologic:  [] Dizziness   [] Seizures   [] History of stroke   [] History of TIA  [] Aphasia   [] Vissual  changes   [] Weakness or numbness in arm   [] Weakness or numbness in leg Musculoskeletal:   [] Joint swelling   [] Joint pain   [] Low back pain Hematologic:  [] Easy bruising  [] Easy bleeding   [] Hypercoagulable state   [] Anemic Gastrointestinal:  [] Diarrhea   [] Vomiting  [] Gastroesophageal reflux/heartburn   [] Difficulty swallowing. Genitourinary:  [] Chronic kidney disease   [] Difficult urination  [] Frequent urination   [] Blood in urine Skin:  [] Rashes   [] Ulcers  Psychological:  [] History of anxiety   []  History of major depression.  Physical Examination  Vitals:   05/12/18 1532  BP: 133/73  Pulse: 88  Resp: 16  Weight: 166 lb (75.3 kg)  Height: 5\' 1"  (1.549 m)   Body mass index is 31.37 kg/m. Gen: WD/WN, NAD Head: [place]/AT, No temporalis wasting.  Ear/Nose/Throat: Hearing grossly intact, nares w/o erythema or drainage Eyes: PER, EOMI, sclera nonicteric.  Neck: Supple, no large masses.   Pulmonary:  Good air movement, no audible wheezing bilaterally, no use of accessory muscles.  Cardiac: RRR, no JVD Vascular: scattered varicosities present bilaterally.  Moderate to severe  venous stasis changes to the legs L>R.  3+ soft pitting edema left Vessel Right Left  PT Palpable Palpable  DP Palpable Palpable  Gastrointestinal: Non-distended. No guarding/no peritoneal signs.  Musculoskeletal: M/S 5/5 throughout.  No deformity or atrophy.  Neurologic: CN 2-12 intact. Symmetrical.  Speech is fluent. Motor exam as listed above. Psychiatric: Judgment intact, Mood & affect appropriate for pt's clinical situation. Dermatologic: venous rashes no ulcers noted.  No changes consistent with cellulitis. Lymph : No lichenification or skin changes of chronic lymphedema.  CBC Lab Results  Component Value Date   WBC 8.8 03/20/2016   HGB 12.9 03/20/2016   HCT 39.3 03/20/2016   MCV 78.1 03/20/2016   PLT 237.0 03/20/2016    BMET    Component Value Date/Time   NA 140 11/12/2017 1042   K 4.4  11/12/2017 1042   CL 101 11/12/2017 1042   CO2 33 (H) 11/12/2017 1042   GLUCOSE 85 11/12/2017 1042   BUN 14 11/12/2017 1042   CREATININE 0.73 11/12/2017 1042   CALCIUM 9.3 11/12/2017 1042   GFRNONAA 87 (L) 06/02/2013 0952   GFRAA >90 06/02/2013 0952   CrCl cannot be calculated (Patient's most recent lab result is older than the maximum 21 days allowed.).  COAG Lab Results  Component Value Date   INR 1.1 ratio (H) 03/30/2009    Radiology No results found.   Assessment/Plan 1. Chronic venous insufficiency  No surgery or intervention at this point in  time.    I have reviewed my discussion with the patient regarding lymphedema and why it  causes symptoms.  Patient will continue wearing graduated compression stockings class 1 (20-30 mmHg) on a daily basis a prescription was given. The patient is reminded to put the stockings on first thing in the morning and removing them in the evening. The patient is instructed specifically not to sleep in the stockings.   In addition, behavioral modification throughout the day will be continued.  This will include frequent elevation (such as in a recliner), use of over the counter pain medications as needed and exercise such as walking.  I have reviewed systemic causes for chronic edema such as liver, kidney and cardiac etiologies and there does not appear to be any significant changes in these organ systems over the past year.  The patient is under the impression that these organ systems are all stable and unchanged.    The patient will continue aggressive use of the  lymph pump.  This will continue to improve the edema control and prevent sequela such as ulcers and infections.   The patient will follow-up with me PRN  2. Lymphedema See #1  3. Essential hypertension, benign Continue antihypertensive medications as already ordered, these medications have been reviewed and there are no changes at this time.   4. Primary osteoarthritis  involving multiple joints Continue NSAID medications as already ordered, these medications have been reviewed and there are no changes at this time.  Continued activity and therapy was stressed.   5. High cholesterol Continue statin as ordered and reviewed, no changes at this time    Hortencia Pilar, MD  05/12/2018 3:40 PM

## 2018-05-13 ENCOUNTER — Encounter: Payer: Self-pay | Admitting: Family Medicine

## 2018-05-15 ENCOUNTER — Other Ambulatory Visit: Payer: Self-pay | Admitting: Family Medicine

## 2018-05-26 ENCOUNTER — Ambulatory Visit: Payer: Self-pay | Admitting: Plastic Surgery

## 2018-05-26 DIAGNOSIS — N62 Hypertrophy of breast: Secondary | ICD-10-CM

## 2018-05-26 HISTORY — PX: REDUCTION MAMMAPLASTY: SUR839

## 2018-05-27 ENCOUNTER — Encounter (HOSPITAL_BASED_OUTPATIENT_CLINIC_OR_DEPARTMENT_OTHER): Payer: Self-pay | Admitting: *Deleted

## 2018-05-27 ENCOUNTER — Other Ambulatory Visit: Payer: Self-pay

## 2018-05-27 ENCOUNTER — Encounter (HOSPITAL_BASED_OUTPATIENT_CLINIC_OR_DEPARTMENT_OTHER)
Admission: RE | Admit: 2018-05-27 | Discharge: 2018-05-27 | Disposition: A | Discharge status: Home / Self Care | Payer: Medicare Other | Source: Ambulatory Visit | Attending: Plastic Surgery | Admitting: Plastic Surgery

## 2018-05-27 DIAGNOSIS — I10 Essential (primary) hypertension: Secondary | ICD-10-CM | POA: Diagnosis not present

## 2018-05-27 DIAGNOSIS — Z01810 Encounter for preprocedural cardiovascular examination: Secondary | ICD-10-CM | POA: Diagnosis not present

## 2018-05-27 DIAGNOSIS — Z01812 Encounter for preprocedural laboratory examination: Secondary | ICD-10-CM | POA: Diagnosis not present

## 2018-05-27 DIAGNOSIS — R9431 Abnormal electrocardiogram [ECG] [EKG]: Secondary | ICD-10-CM | POA: Insufficient documentation

## 2018-05-27 LAB — BASIC METABOLIC PANEL
ANION GAP: 10 (ref 5–15)
BUN: 11 mg/dL (ref 8–23)
CHLORIDE: 101 mmol/L (ref 98–111)
CO2: 27 mmol/L (ref 22–32)
Calcium: 8.9 mg/dL (ref 8.9–10.3)
Creatinine, Ser: 0.72 mg/dL (ref 0.44–1.00)
GFR calc non Af Amer: >60 mL/min (ref >60)
Glucose, Bld: 79 mg/dL (ref 70–99)
POTASSIUM: 3.1 mmol/L — AB (ref 3.5–5.1)
SODIUM: 138 mmol/L (ref 135–145)

## 2018-05-27 NOTE — Progress Notes (Signed)
EKG completed and reviewed with Dr Lissa Hoard, will proceed with surgery as scheduled.

## 2018-05-28 ENCOUNTER — Telehealth: Payer: Self-pay | Admitting: Family Medicine

## 2018-05-28 MED ORDER — POTASSIUM CHLORIDE CRYS ER 20 MEQ PO TBCR: 20.0000 meq | EXTENDED_RELEASE_TABLET | Freq: Every day | ORAL | 0 refills | Status: DC

## 2018-05-28 NOTE — Telephone Encounter (Signed)
-----   Message from Tracy Polka, RN sent at 05/27/2018  4:34 PM EDT ----- Dr. Diona Browner, Ms. Norling is having a bilateral breast reduction 06/04/2018 at the [facility].  She came in today for pre-op labs and her K+ was 3.1.  I reviewed with Dr. Roberts Gaudy (anesthesiologist) and he suggested contacting you for possible po supplementation of K+ prior to her surgery.  He asked if you would be willing to call pt. and get her started on K+.  We will repeat her K+ on the day of surgery.  Thank you, Oren Section, RN

## 2018-05-28 NOTE — Telephone Encounter (Signed)
Call pt.. Potassium found to be low. Have her start on Kdur 20 mEQ daily until surgery. Will send in.

## 2018-05-28 NOTE — Telephone Encounter (Signed)
Left message for Mrs. Durell that her potassium level came back low on her pre-op labs.  She will need to take a potassium supplement K-Dur 20 meq daily until surgery.  Prescription has been sent to [facility] on [address].

## 2018-05-30 ENCOUNTER — Ambulatory Visit: Payer: Self-pay | Admitting: Plastic Surgery

## 2018-06-03 ENCOUNTER — Encounter: Payer: Self-pay | Admitting: Podiatry

## 2018-06-03 ENCOUNTER — Ambulatory Visit (INDEPENDENT_AMBULATORY_CARE_PROVIDER_SITE_OTHER): Payer: Medicare Other | Admitting: Podiatry

## 2018-06-03 DIAGNOSIS — Q828 Other specified congenital malformations of skin: Secondary | ICD-10-CM | POA: Diagnosis not present

## 2018-06-03 DIAGNOSIS — M79675 Pain in left toe(s): Secondary | ICD-10-CM

## 2018-06-03 DIAGNOSIS — B351 Tinea unguium: Secondary | ICD-10-CM

## 2018-06-03 DIAGNOSIS — M79674 Pain in right toe(s): Secondary | ICD-10-CM

## 2018-06-04 ENCOUNTER — Ambulatory Visit (HOSPITAL_BASED_OUTPATIENT_CLINIC_OR_DEPARTMENT_OTHER)
Admission: RE | Admit: 2018-06-04 | Discharge: 2018-06-05 | Disposition: A | Discharge status: Home / Self Care | Payer: Medicare Other | Source: Ambulatory Visit | Attending: Plastic Surgery | Admitting: Plastic Surgery

## 2018-06-04 ENCOUNTER — Encounter (HOSPITAL_BASED_OUTPATIENT_CLINIC_OR_DEPARTMENT_OTHER)
Admission: RE | Disposition: A | Discharge status: Home / Self Care | Payer: Self-pay | Source: Ambulatory Visit | Attending: Plastic Surgery

## 2018-06-04 ENCOUNTER — Encounter (HOSPITAL_BASED_OUTPATIENT_CLINIC_OR_DEPARTMENT_OTHER): Payer: Self-pay

## 2018-06-04 ENCOUNTER — Other Ambulatory Visit: Payer: Self-pay

## 2018-06-04 ENCOUNTER — Ambulatory Visit (HOSPITAL_BASED_OUTPATIENT_CLINIC_OR_DEPARTMENT_OTHER): Payer: Medicare Other | Admitting: Anesthesiology

## 2018-06-04 DIAGNOSIS — Z9049 Acquired absence of other specified parts of digestive tract: Secondary | ICD-10-CM | POA: Diagnosis not present

## 2018-06-04 DIAGNOSIS — M542 Cervicalgia: Secondary | ICD-10-CM | POA: Insufficient documentation

## 2018-06-04 DIAGNOSIS — Z91013 Allergy to seafood: Secondary | ICD-10-CM | POA: Diagnosis not present

## 2018-06-04 DIAGNOSIS — Z8042 Family history of malignant neoplasm of prostate: Secondary | ICD-10-CM | POA: Insufficient documentation

## 2018-06-04 DIAGNOSIS — J45909 Unspecified asthma, uncomplicated: Secondary | ICD-10-CM | POA: Diagnosis not present

## 2018-06-04 DIAGNOSIS — E785 Hyperlipidemia, unspecified: Secondary | ICD-10-CM | POA: Insufficient documentation

## 2018-06-04 DIAGNOSIS — M549 Dorsalgia, unspecified: Secondary | ICD-10-CM | POA: Insufficient documentation

## 2018-06-04 DIAGNOSIS — Z87891 Personal history of nicotine dependence: Secondary | ICD-10-CM | POA: Insufficient documentation

## 2018-06-04 DIAGNOSIS — Z801 Family history of malignant neoplasm of trachea, bronchus and lung: Secondary | ICD-10-CM | POA: Diagnosis not present

## 2018-06-04 DIAGNOSIS — I10 Essential (primary) hypertension: Secondary | ICD-10-CM | POA: Insufficient documentation

## 2018-06-04 DIAGNOSIS — Z96653 Presence of artificial knee joint, bilateral: Secondary | ICD-10-CM | POA: Insufficient documentation

## 2018-06-04 DIAGNOSIS — K5730 Diverticulosis of large intestine without perforation or abscess without bleeding: Secondary | ICD-10-CM | POA: Diagnosis not present

## 2018-06-04 DIAGNOSIS — N62 Hypertrophy of breast: Secondary | ICD-10-CM | POA: Diagnosis not present

## 2018-06-04 DIAGNOSIS — M1990 Unspecified osteoarthritis, unspecified site: Secondary | ICD-10-CM | POA: Insufficient documentation

## 2018-06-04 DIAGNOSIS — Z823 Family history of stroke: Secondary | ICD-10-CM | POA: Insufficient documentation

## 2018-06-04 DIAGNOSIS — Z9851 Tubal ligation status: Secondary | ICD-10-CM | POA: Insufficient documentation

## 2018-06-04 DIAGNOSIS — J4520 Mild intermittent asthma, uncomplicated: Secondary | ICD-10-CM | POA: Diagnosis not present

## 2018-06-04 DIAGNOSIS — Z91041 Radiographic dye allergy status: Secondary | ICD-10-CM | POA: Diagnosis not present

## 2018-06-04 DIAGNOSIS — M8580 Other specified disorders of bone density and structure, unspecified site: Secondary | ICD-10-CM | POA: Diagnosis not present

## 2018-06-04 HISTORY — PX: BREAST REDUCTION SURGERY: SHX8

## 2018-06-04 HISTORY — DX: Hypertrophy of breast: N62

## 2018-06-04 LAB — POCT I-STAT, CHEM 8
BUN: 13 mg/dL (ref 8–23)
CALCIUM ION: 1.06 mmol/L — AB (ref 1.15–1.40)
CREATININE: 0.7 mg/dL (ref 0.44–1.00)
Chloride: 101 mmol/L (ref 98–111)
Glucose, Bld: 81 mg/dL (ref 70–99)
HCT: 41 % (ref 36.0–46.0)
HEMOGLOBIN: 13.9 g/dL (ref 12.0–15.0)
Potassium: 3.2 mmol/L — ABNORMAL LOW (ref 3.5–5.1)
SODIUM: 141 mmol/L (ref 135–145)
TCO2: 25 mmol/L (ref 22–32)

## 2018-06-04 SURGERY — MAMMOPLASTY, REDUCTION
Anesthesia: General | Site: Breast | Laterality: Bilateral

## 2018-06-04 MED ORDER — HYDROMORPHONE HCL 1 MG/ML IJ SOLN: 0.5000 mg | INTRAMUSCULAR | Status: DC | PRN

## 2018-06-04 MED ORDER — DEXAMETHASONE SODIUM PHOSPHATE 4 MG/ML IJ SOLN
INTRAMUSCULAR | Status: DC | PRN
  Administered 2018-06-04: 10 mg via INTRAVENOUS

## 2018-06-04 MED ORDER — ONDANSETRON HCL 4 MG/2ML IJ SOLN
INTRAMUSCULAR | Status: AC
Start: 2018-06-04 — End: ?
  Filled 2018-06-04: qty 2

## 2018-06-04 MED ORDER — SENNA 8.6 MG PO TABS
1.0000 | ORAL_TABLET | Freq: Two times a day (BID) | ORAL | Status: DC
  Administered 2018-06-04: 8.6 mg via ORAL
  Filled 2018-06-04: qty 1

## 2018-06-04 MED ORDER — DEXAMETHASONE SODIUM PHOSPHATE 10 MG/ML IJ SOLN
INTRAMUSCULAR | Status: AC
Start: 2018-06-04 — End: ?
  Filled 2018-06-04: qty 1

## 2018-06-04 MED ORDER — FENTANYL CITRATE (PF) 100 MCG/2ML IJ SOLN
50.0000 ug | INTRAMUSCULAR | Status: AC | PRN
  Administered 2018-06-04 (×4): 50 ug via INTRAVENOUS

## 2018-06-04 MED ORDER — DIPHENHYDRAMINE HCL 12.5 MG/5ML PO ELIX: 12.5000 mg | ORAL_SOLUTION | Freq: Four times a day (QID) | ORAL | Status: DC | PRN

## 2018-06-04 MED ORDER — HYDROMORPHONE HCL 1 MG/ML IJ SOLN
INTRAMUSCULAR | Status: AC
  Filled 2018-06-04: qty 0.5

## 2018-06-04 MED ORDER — SODIUM CHLORIDE 0.9% FLUSH: 3.0000 mL | INTRAVENOUS | Status: DC | PRN

## 2018-06-04 MED ORDER — PHENYLEPHRINE HCL 10 MG/ML IJ SOLN
INTRAMUSCULAR | Status: DC | PRN
  Administered 2018-06-04: 120 ug via INTRAVENOUS

## 2018-06-04 MED ORDER — ROCURONIUM BROMIDE 10 MG/ML (PF) SYRINGE
PREFILLED_SYRINGE | INTRAVENOUS | Status: AC
  Filled 2018-06-04: qty 10

## 2018-06-04 MED ORDER — MIDAZOLAM HCL 2 MG/2ML IJ SOLN
1.0000 mg | INTRAMUSCULAR | Status: DC | PRN
  Administered 2018-06-04: 1 mg via INTRAVENOUS

## 2018-06-04 MED ORDER — MIDAZOLAM HCL 2 MG/2ML IJ SOLN
INTRAMUSCULAR | Status: AC
  Filled 2018-06-04: qty 2

## 2018-06-04 MED ORDER — ONDANSETRON HCL 4 MG/2ML IJ SOLN: 4.0000 mg | Freq: Four times a day (QID) | INTRAMUSCULAR | Status: DC | PRN

## 2018-06-04 MED ORDER — KCL IN DEXTROSE-NACL 20-5-0.45 MEQ/L-%-% IV SOLN
INTRAVENOUS | Status: DC
  Administered 2018-06-04: 18:00:00 via INTRAVENOUS
  Filled 2018-06-04: qty 1000

## 2018-06-04 MED ORDER — ONDANSETRON HCL 4 MG/2ML IJ SOLN
INTRAMUSCULAR | Status: DC | PRN
  Administered 2018-06-04: 4 mg via INTRAVENOUS

## 2018-06-04 MED ORDER — FENTANYL CITRATE (PF) 100 MCG/2ML IJ SOLN
INTRAMUSCULAR | Status: AC
  Filled 2018-06-04: qty 2

## 2018-06-04 MED ORDER — NAPROXEN 500 MG PO TABS
500.0000 mg | ORAL_TABLET | Freq: Two times a day (BID) | ORAL | Status: DC | PRN
Start: 2018-06-04 — End: 2018-06-05

## 2018-06-04 MED ORDER — BUPIVACAINE-EPINEPHRINE 0.25% -1:200000 IJ SOLN
INTRAMUSCULAR | Status: DC | PRN
  Administered 2018-06-04: 20 mL

## 2018-06-04 MED ORDER — PROMETHAZINE HCL 25 MG/ML IJ SOLN: 6.2500 mg | INTRAMUSCULAR | Status: DC | PRN

## 2018-06-04 MED ORDER — SCOPOLAMINE 1 MG/3DAYS TD PT72: 1.0000 | MEDICATED_PATCH | Freq: Once | TRANSDERMAL | Status: DC | PRN

## 2018-06-04 MED ORDER — PROPOFOL 10 MG/ML IV BOLUS
INTRAVENOUS | Status: DC | PRN
  Administered 2018-06-04: 100 mg via INTRAVENOUS

## 2018-06-04 MED ORDER — ACETAMINOPHEN 325 MG PO TABS
325.0000 mg | ORAL_TABLET | Freq: Four times a day (QID) | ORAL | Status: DC
  Administered 2018-06-04 – 2018-06-05 (×3): 325 mg via ORAL
  Filled 2018-06-04 (×3): qty 1

## 2018-06-04 MED ORDER — CEFAZOLIN SODIUM-DEXTROSE 2-4 GM/100ML-% IV SOLN
2.0000 g | Freq: Three times a day (TID) | INTRAVENOUS | Status: DC
  Administered 2018-06-04 – 2018-06-05 (×2): 2 g via INTRAVENOUS
  Filled 2018-06-04 (×3): qty 100

## 2018-06-04 MED ORDER — CEFAZOLIN SODIUM-DEXTROSE 2-4 GM/100ML-% IV SOLN
INTRAVENOUS | Status: AC
  Filled 2018-06-04: qty 100

## 2018-06-04 MED ORDER — ONDANSETRON 4 MG PO TBDP: 4.0000 mg | ORAL_TABLET | Freq: Four times a day (QID) | ORAL | Status: DC | PRN

## 2018-06-04 MED ORDER — ROCURONIUM BROMIDE 100 MG/10ML IV SOLN
INTRAVENOUS | Status: DC | PRN
  Administered 2018-06-04: 30 mg via INTRAVENOUS
  Administered 2018-06-04: 40 mg via INTRAVENOUS

## 2018-06-04 MED ORDER — SODIUM CHLORIDE 0.9 % IV SOLN: 250.0000 mL | INTRAVENOUS | Status: DC | PRN

## 2018-06-04 MED ORDER — SUCCINYLCHOLINE CHLORIDE 200 MG/10ML IV SOSY
PREFILLED_SYRINGE | INTRAVENOUS | Status: AC
  Filled 2018-06-04: qty 10

## 2018-06-04 MED ORDER — SODIUM CHLORIDE 0.9% FLUSH
3.0000 mL | Freq: Two times a day (BID) | INTRAVENOUS | Status: DC
  Administered 2018-06-04: 3 mL via INTRAVENOUS

## 2018-06-04 MED ORDER — HYDROMORPHONE HCL 1 MG/ML IJ SOLN
0.2500 mg | INTRAMUSCULAR | Status: DC | PRN
  Administered 2018-06-04: 0.5 mg via INTRAVENOUS

## 2018-06-04 MED ORDER — SUGAMMADEX SODIUM 200 MG/2ML IV SOLN
INTRAVENOUS | Status: DC | PRN
  Administered 2018-06-04: 200 mg via INTRAVENOUS

## 2018-06-04 MED ORDER — DIPHENHYDRAMINE HCL 50 MG/ML IJ SOLN: 12.5000 mg | Freq: Four times a day (QID) | INTRAMUSCULAR | Status: DC | PRN

## 2018-06-04 MED ORDER — 0.9 % SODIUM CHLORIDE (POUR BTL) OPTIME
TOPICAL | Status: DC | PRN
  Administered 2018-06-04: 1000 mL

## 2018-06-04 MED ORDER — SUGAMMADEX SODIUM 200 MG/2ML IV SOLN
INTRAVENOUS | Status: AC
  Filled 2018-06-04: qty 2

## 2018-06-04 MED ORDER — OXYCODONE HCL 5 MG PO TABS: 5.0000 mg | ORAL_TABLET | ORAL | Status: DC | PRN

## 2018-06-04 MED ORDER — CEFAZOLIN SODIUM-DEXTROSE 2-4 GM/100ML-% IV SOLN
2.0000 g | INTRAVENOUS | Status: AC
  Administered 2018-06-04: 2 g via INTRAVENOUS

## 2018-06-04 MED ORDER — LACTATED RINGERS IV SOLN
INTRAVENOUS | Status: DC
  Administered 2018-06-04 (×2): via INTRAVENOUS

## 2018-06-04 MED ORDER — LIDOCAINE HCL (CARDIAC) PF 100 MG/5ML IV SOSY
PREFILLED_SYRINGE | INTRAVENOUS | Status: AC
  Filled 2018-06-04: qty 5

## 2018-06-04 SURGICAL SUPPLY — 65 items
ADH SKN CLS APL DERMABOND .7 (GAUZE/BANDAGES/DRESSINGS)
BAG DECANTER FOR FLEXI CONT (MISCELLANEOUS) ×3 IMPLANT
BINDER BREAST LRG (GAUZE/BANDAGES/DRESSINGS) IMPLANT
BINDER BREAST MEDIUM (GAUZE/BANDAGES/DRESSINGS) IMPLANT
BINDER BREAST XLRG (GAUZE/BANDAGES/DRESSINGS) IMPLANT
BINDER BREAST XXLRG (GAUZE/BANDAGES/DRESSINGS) IMPLANT
BIOPATCH RED 1 DISK 7.0 (GAUZE/BANDAGES/DRESSINGS) IMPLANT
BIOPATCH RED 1IN DISK 7.0MM (GAUZE/BANDAGES/DRESSINGS)
BLADE HEX COATED 2.75 (ELECTRODE) ×3 IMPLANT
BLADE KNIFE PERSONA 10 (BLADE) ×6 IMPLANT
BLADE SURG 15 STRL LF DISP TIS (BLADE) IMPLANT
BLADE SURG 15 STRL SS (BLADE)
BNDG GAUZE ELAST 4 BULKY (GAUZE/BANDAGES/DRESSINGS) ×6 IMPLANT
CANISTER SUCT 1200ML W/VALVE (MISCELLANEOUS) ×3 IMPLANT
CHLORAPREP W/TINT 26ML (MISCELLANEOUS) ×3 IMPLANT
COVER BACK TABLE 60X90IN (DRAPES) ×3 IMPLANT
COVER MAYO STAND STRL (DRAPES) ×3 IMPLANT
DECANTER SPIKE VIAL GLASS SM (MISCELLANEOUS) IMPLANT
DERMABOND ADVANCED (GAUZE/BANDAGES/DRESSINGS)
DERMABOND ADVANCED .7 DNX12 (GAUZE/BANDAGES/DRESSINGS) IMPLANT
DRAIN CHANNEL 19F RND (DRAIN) IMPLANT
DRAPE LAPAROSCOPIC ABDOMINAL (DRAPES) ×3 IMPLANT
DRSG PAD ABDOMINAL 8X10 ST (GAUZE/BANDAGES/DRESSINGS) ×6 IMPLANT
ELECT BLADE 4.0 EZ CLEAN MEGAD (MISCELLANEOUS)
ELECT REM PT RETURN 9FT ADLT (ELECTROSURGICAL) ×3
ELECTRODE BLDE 4.0 EZ CLN MEGD (MISCELLANEOUS) IMPLANT
ELECTRODE REM PT RTRN 9FT ADLT (ELECTROSURGICAL) ×1 IMPLANT
EVACUATOR SILICONE 100CC (DRAIN) IMPLANT
GAUZE SPONGE 4X4 12PLY STRL LF (GAUZE/BANDAGES/DRESSINGS) ×3 IMPLANT
GLOVE BIO SURGEON STRL SZ 6.5 (GLOVE) ×6 IMPLANT
GLOVE BIO SURGEONS STRL SZ 6.5 (GLOVE) ×3
GOWN STRL REUS W/ TWL LRG LVL3 (GOWN DISPOSABLE) ×3 IMPLANT
GOWN STRL REUS W/TWL LRG LVL3 (GOWN DISPOSABLE) ×9
NDL HYPO 25X1 1.5 SAFETY (NEEDLE) ×1 IMPLANT
NDL SAFETY ECLIPSE 18X1.5 (NEEDLE) IMPLANT
NEEDLE HYPO 18GX1.5 SHARP (NEEDLE)
NEEDLE HYPO 25X1 1.5 SAFETY (NEEDLE) ×3 IMPLANT
NS IRRIG 1000ML POUR BTL (IV SOLUTION) IMPLANT
PACK BASIN DAY SURGERY FS (CUSTOM PROCEDURE TRAY) ×3 IMPLANT
PAD ALCOHOL SWAB (MISCELLANEOUS) IMPLANT
PENCIL BUTTON HOLSTER BLD 10FT (ELECTRODE) ×3 IMPLANT
PIN SAFETY STERILE (MISCELLANEOUS) IMPLANT
SLEEVE SCD COMPRESS KNEE MED (MISCELLANEOUS) ×3 IMPLANT
SPONGE LAP 18X18 RF (DISPOSABLE) ×6 IMPLANT
STRIP SUTURE WOUND CLOSURE 1/2 (SUTURE) ×6 IMPLANT
SUT MNCRL AB 4-0 PS2 18 (SUTURE) ×10 IMPLANT
SUT MON AB 3-0 SH 27 (SUTURE) ×3
SUT MON AB 3-0 SH27 (SUTURE) ×1 IMPLANT
SUT MON AB 5-0 PS2 18 (SUTURE) ×6 IMPLANT
SUT PDS 3-0 CT2 (SUTURE)
SUT PDS AB 2-0 CT2 27 (SUTURE) IMPLANT
SUT PDS II 3-0 CT2 27 ABS (SUTURE) IMPLANT
SUT SILK 3 0 PS 1 (SUTURE) IMPLANT
SYR 3ML 23GX1 SAFETY (SYRINGE) ×3 IMPLANT
SYR 50ML LL SCALE MARK (SYRINGE) IMPLANT
SYR BULB IRRIGATION 50ML (SYRINGE) ×3 IMPLANT
SYR CONTROL 10ML LL (SYRINGE) ×3 IMPLANT
TAPE MEASURE VINYL STERILE (MISCELLANEOUS) ×3 IMPLANT
TOWEL GREEN STERILE FF (TOWEL DISPOSABLE) ×6 IMPLANT
TUBE CONNECTING 20'X1/4 (TUBING) ×1
TUBE CONNECTING 20X1/4 (TUBING) ×2 IMPLANT
TUBING INFILTRATION IT-10001 (TUBING) IMPLANT
TUBING SET GRADUATE ASPIR 12FT (MISCELLANEOUS) IMPLANT
UNDERPAD 30X30 (UNDERPADS AND DIAPERS) ×6 IMPLANT
YANKAUER SUCT BULB TIP NO VENT (SUCTIONS) ×3 IMPLANT

## 2018-06-04 NOTE — H&P (Signed)
Tracy Dixon is an 77 y.o. female.   Chief Complaint: mammary hypertrophy HPI: The patient is a 77 yo female here for bilateral breast reduction surgery. She has extremely large breasts causing symptoms that include the following complaints: Back pain (upper and lower) and neck pain.  Activities that are hindered by enlarged breasts include: walking upright, exercise. Mammogram history: this year and was negative. BI-RADS 1 done at [facility].  She is 5 feet tall and weighs 168 pounds.  Preoperative bra size = 40 DD cup.  Nipple to inframammary fold distance:  14 cm. Sternal Noth to nipple distance:  Right = 30 cm, Left = 32 cm. Internipple distance:  24 cm. Areolar Diameter:  5 cm The estimated excess breast tissue to be removed at the time of surgery = 500 grams on the left and 500 grams on the right.     Past Medical History:  Diagnosis Date  . 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 Allergy   . Arthritis   . Asthma   . Complication of anesthesia    stays sleepy  . Diverticulosis of colon   . Gallstones   . Hyperlipidemia   . Hypertension   . Osteoarthritis   . Symptomatic mammary hypertrophy     Past Surgical History:  Procedure Laterality Date  . Bilateral tubal ligation  2005  . Carotid doppler  07/2006   Neg  . CESAREAN SECTION  2005  . CHOLECYSTECTOMY  1982  . DECOMPRESSIVE LUMBAR LAMINECTOMY LEVEL 3  06/10/2013   Procedure: DECOMPRESSIVE LUMBAR LAMINECTOMY LEVEL 3;  Surgeon: Melina Schools, MD;  Location: [place];  Service: Orthopedics;;  lumbar three to five decompression  . MRI brain  2007   acoustic neuroma (check yearly with MRI)  . PFT's  12/2006   (-) FEV1/FVC 79%, FEV1 84%  . REPLACEMENT TOTAL KNEE BILATERAL  2005  . TONSILLECTOMY  2005  . TUBAL LIGATION      Family History  Problem Relation Age of Onset  . Cancer Mother        Lung CA  . Stroke Mother        CVA  . Cancer Father        Prostate  . Heart disease Neg Hx     Social History:  reports that she quit smoking about 45 years ago. Her smoking use included cigarettes. She quit after 15.00 years of use. She has never used smokeless tobacco. She reports that she does not drink alcohol or use drugs.  Allergies:  Allergies  Allergen Reactions  . Contrast Media [Iodinated Diagnostic Agents] Hives  . Crab [Shellfish Allergy] Hives    No medications prior to admission.    No results found for this or any previous visit (from the past 48 hour(s)). No results found.  Review of Systems  Constitutional: Negative.   HENT: Negative.   Eyes: Negative.   Cardiovascular: Negative.   Gastrointestinal: Negative.   Genitourinary: Negative.   Musculoskeletal: Positive for back pain and neck pain.  Skin: Negative.   Neurological: Negative.   Psychiatric/Behavioral: Negative.     Height 5\' 1"  (1.549 m), weight 74.8 kg (165 lb). Physical Exam  Constitutional: She is oriented to person, place, and time. She appears well-developed and well-nourished.  HENT:  Head: Normocephalic and atraumatic.  Eyes: Pupils are equal, round, and reactive to light. EOM are normal.  Cardiovascular: Normal rate.  Respiratory: Effort normal.  GI: Soft.  She exhibits no distension.  Neurological: She is alert and oriented to person, place, and time.  Skin: Skin is warm. No rash noted. No erythema.  Psychiatric: She has a normal mood and affect. Her behavior is normal. Judgment and thought content normal.     Assessment/Plan Plan for bilateral breast reduction.  [place], DO 06/04/2018, 7:35 AM

## 2018-06-04 NOTE — Op Note (Signed)
Breast Reduction Op note:    DATE OF PROCEDURE: 06/04/2018  LOCATION: [place]  SURGEON: Lyndee Leo Sanger Natajah Derderian, DO  PREOPERATIVE DIAGNOSIS 1. Macromastia 2. Neck Pain 3. Back Pain  POSTOPERATIVE DIAGNOSIS 1. Macromastia 2. Neck Pain 3. Back Pain  PROCEDURES 1. Bilateral breast reduction.  Right reduction 387 g, Left reduction 720 g  COMPLICATIONS: None.  DRAINS: none  INDICATIONS FOR PROCEDURE Tracy Dixon is a 77 y.o. year-old female born on 17-Aug-1941,with a history of symptomatic macromastia with concominant back pain, neck pain, shoulder grooving from her bra.   MRN: [id number]  CONSENT Informed consent was obtained directly from the patient. The risks, benefits and alternatives were fully discussed. Specific risks including but not limited to bleeding, infection, hematoma, seroma, scarring, pain, nipple necrosis, asymmetry, poor cosmetic results, and need for further surgery were discussed. The patient had ample opportunity to have her questions answered to her satisfaction.  DESCRIPTION OF PROCEDURE  Patient was brought into the operating room and placed in a supine position.  SCDs were placed and appropriate padding was performed.  Antibiotics were given. The patient underwent general anesthesia and the chest was prepped and draped in a sterile fashion.  A timeout was performed and all information was confirmed to be correct.  Right side: Preoperative markings were confirmed.  Incision lines were injected with 1% Xylocaine with epinephrine.  After waiting for vasoconstriction, the marked lines were incised.  A Wise-pattern superomedial breast reduction was performed by de-epithelializing the pedicle, using bovie to create the superomedial pedicle, and removing breast tissue from the superior, lateral, and inferior portions of the breast.  Care was taken to not undermine the breast pedicle. Hemostasis was achieved.  The nipple was gently rotated  into position and the soft tissue closed with 4-0 Monocryl.   The pocket was irrigated and hemostasis confirmed.  The deep tissues were approximated with 3-0 monocryl sutures and the skin was closed with deep dermal and subcuticular 4-0 Monocryl sutures.  The nipple and skin flaps had good capillary refill at the end of the procedure.    Left side: Preoperative markings were confirmed.  Incision lines were injected with 1% Xylocaine with epinephrine.  After waiting for vasoconstriction, the marked lines were incised.  A Wise-pattern superomedial breast reduction was performed by de-epithelializing the pedicle, using bovie to create the superomedial pedicle, and removing breast tissue from the superior, lateral, and inferior portions of the breast.  Care was taken to not undermine the breast pedicle. Hemostasis was achieved.  The nipple was gently rotated into position and the soft tissue was closed with 4-0 Monocryl.  The patient was sat upright and size and shape symmetry was confirmed.  The pocket was irrigated and hemostasis confirmed.  The deep tissues were approximated with 3-0 monocryl sutures and the skin was closed with deep dermal and subcuticular 4-0 Monocryl sutures.  Dermabond was applied.  A breast binder and ABDs were placed.  The nipple and skin flaps had good capillary refill at the end of the procedure.  The patient tolerated the procedure well. The patient was allowed to wake from anesthesia and taken to the recovery room in satisfactory condition

## 2018-06-04 NOTE — Interval H&P Note (Signed)
History and Physical Interval Note:  06/04/2018 1:45 PM  Tracy Dixon  has presented today for surgery, with the diagnosis of Symptomatic mammary hypertrophy  The various methods of treatment have been discussed with the patient and family. After consideration of risks, benefits and other options for treatment, the patient has consented to  Procedure(s): MAMMARY REDUCTION  (BREAST) (Bilateral) as a surgical intervention .  The patient's history has been reviewed, patient examined, no change in status, stable for surgery.  I have reviewed the patient's chart and labs.  Questions were answered to the patient's satisfaction.     Tracy Dixon

## 2018-06-04 NOTE — Transfer of Care (Signed)
Immediate Anesthesia Transfer of Care Note  Patient: Tracy Dixon  Procedure(s) Performed: MAMMARY REDUCTION  (BREAST) (Bilateral Breast)  Patient Location: PACU  Anesthesia Type:General  Level of Consciousness: awake, alert , oriented and drowsy  Airway & Oxygen Therapy: Patient Spontanous Breathing and Patient connected to face mask oxygen  Post-op Assessment: Report given to RN and Post -op Vital signs reviewed and stable  Post vital signs: Reviewed and stable  Last Vitals:  Vitals Value Taken Time  BP    Temp    Pulse 95 06/04/2018  4:37 PM  Resp 11 06/04/2018  4:37 PM  SpO2 97 % 06/04/2018  4:37 PM  Vitals shown include unvalidated device data.  Last Pain:  Vitals:   06/04/18 1205  TempSrc: Oral  PainSc: 0-No pain         Complications: No apparent anesthesia complications

## 2018-06-04 NOTE — Progress Notes (Signed)
Patient ID: Tracy Dixon, female   DOB: Dec 08, 1940, 77 y.o.   MRN: [id number]   Subjective: 77 y.o.-year-old female returns the office today for painful, elongated, thickened toenails which she is unable to trim herself as well as for painful callus to the ball of the right foot and for other calluses but also become painful. Denies any redness or drainage from the nail size with a callus sites.  No recent injury or trauma. She has no other concerns today. Denies any systemic complaints such as fevers, chills, nausea, vomiting.   She is scheduled for breast reduction surgery tomorrow.  Objective: AAO 3, NAD DP/PT pulses palpable, CRT less than 3 seconds Nails hypertrophic, dystrophic, elongated, brittle, discolored 10. There is tenderness overlying the nails 1-5 bilaterally. There is no surrounding erythema or drainage along the nail sites. Right submetatarsal 2  right foot with hyperkerotic lesion as well as the third toe.Marland Kitchen Upon debridement there is no underlying ulceration, drainage, or other signs of infection.  No other open lesions or other pre-ulcerative lesions are identified. No other areas of tenderness bilateral lower extremities. No overlying edema, erythema, increased warmth. No pain with calf compression, swelling, warmth, erythema.  Assessment: Patient presents with symptomatic onychomycosis; hyperkerotic lesion  Plan: -Treatment options including alternatives, risks, complications were discussed -Nails sharply debrided 10 without complication/bleeding.  Continue with topical antifungal that she has. -Sharply debrided the hyperkeratotic lesion right submetatarsal 2 without any complications or bleeding. -Hyperkeratotic lesion sharply debrided x2 without any complications or bleeding on the right foot. -Follow-up in 9 weeks or sooner if any problems arise. In the meantime, encouraged to call the office with any questions, concerns, change in symptoms.   Celesta Gentile,  DPM

## 2018-06-04 NOTE — Anesthesia Procedure Notes (Signed)
Procedure Name: Intubation Date/Time: 06/04/2018 2:29 PM Performed by: Willa Frater, CRNA Pre-anesthesia Checklist: Patient identified, Emergency Drugs available, Suction available and Patient being monitored Patient Re-evaluated:Patient Re-evaluated prior to induction Oxygen Delivery Method: Circle system utilized Preoxygenation: Pre-oxygenation with 100% oxygen Induction Type: IV induction Ventilation: Mask ventilation without difficulty Laryngoscope Size: Mac and 3 Grade View: Grade I Tube type: Oral Number of attempts: 1 Airway Equipment and Method: Stylet and Oral airway Placement Confirmation: ETT inserted through vocal cords under direct vision,  positive ETCO2 and breath sounds checked- equal and bilateral Secured at: 22 cm Tube secured with: Tape Dental Injury: Teeth and Oropharynx as per pre-operative assessment

## 2018-06-04 NOTE — Discharge Instructions (Signed)
No heavy lifting. Continue binder or sports bra. No driving while on pain meds.

## 2018-06-04 NOTE — Anesthesia Preprocedure Evaluation (Addendum)
Anesthesia Evaluation  Patient identified by MRN, date of birth, ID band Patient awake    Reviewed: Allergy & Precautions, NPO status , Patient's Chart, lab work & pertinent test results  Airway Mallampati: II  TM Distance: >3 FB Neck ROM: Full    Dental no notable dental hx.    Pulmonary asthma , former smoker,    Pulmonary exam normal breath sounds clear to auscultation       Cardiovascular hypertension, Normal cardiovascular exam Rhythm:Regular Rate:Normal     Neuro/Psych negative neurological ROS  negative psych ROS   GI/Hepatic negative GI ROS, Neg liver ROS,   Endo/Other  negative endocrine ROS  Renal/GU negative Renal ROS  negative genitourinary   Musculoskeletal negative musculoskeletal ROS (+)   Abdominal   Peds negative pediatric ROS (+)  Hematology negative hematology ROS (+)   Anesthesia Other Findings   Reproductive/Obstetrics negative OB ROS                             Anesthesia Physical Anesthesia Plan  ASA: II  Anesthesia Plan: General   Post-op Pain Management:    Induction: Intravenous  PONV Risk Score and Plan: 3 and Ondansetron, Dexamethasone and Treatment may vary due to age or medical condition  Airway Management Planned: Oral ETT  Additional Equipment:   Intra-op Plan:   Post-operative Plan: Extubation in OR  Informed Consent: I have reviewed the patients History and Physical, chart, labs and discussed the procedure including the risks, benefits and alternatives for the proposed anesthesia with the patient or authorized representative who has indicated his/her understanding and acceptance.   Dental advisory given  Plan Discussed with: CRNA and Surgeon  Anesthesia Plan Comments:         Anesthesia Quick Evaluation

## 2018-06-05 ENCOUNTER — Encounter (HOSPITAL_BASED_OUTPATIENT_CLINIC_OR_DEPARTMENT_OTHER): Payer: Self-pay | Admitting: Plastic Surgery

## 2018-06-05 DIAGNOSIS — M1990 Unspecified osteoarthritis, unspecified site: Secondary | ICD-10-CM | POA: Diagnosis not present

## 2018-06-05 DIAGNOSIS — M549 Dorsalgia, unspecified: Secondary | ICD-10-CM | POA: Diagnosis not present

## 2018-06-05 DIAGNOSIS — N62 Hypertrophy of breast: Secondary | ICD-10-CM | POA: Diagnosis not present

## 2018-06-05 DIAGNOSIS — J45909 Unspecified asthma, uncomplicated: Secondary | ICD-10-CM | POA: Diagnosis not present

## 2018-06-05 DIAGNOSIS — K5730 Diverticulosis of large intestine without perforation or abscess without bleeding: Secondary | ICD-10-CM | POA: Diagnosis not present

## 2018-06-05 DIAGNOSIS — M542 Cervicalgia: Secondary | ICD-10-CM | POA: Diagnosis not present

## 2018-06-05 NOTE — Anesthesia Postprocedure Evaluation (Signed)
Anesthesia Post Note  Patient: Tracy Dixon  Procedure(s) Performed: MAMMARY REDUCTION  (BREAST) (Bilateral Breast)     Patient location during evaluation: PACU Anesthesia Type: General Level of consciousness: awake and alert Pain management: pain level controlled Vital Signs Assessment: post-procedure vital signs reviewed and stable Respiratory status: spontaneous breathing, nonlabored ventilation, respiratory function stable and patient connected to nasal cannula oxygen Cardiovascular status: blood pressure returned to baseline and stable Postop Assessment: no apparent nausea or vomiting Anesthetic complications: no    Last Vitals:  Vitals:   06/05/18 0400 06/05/18 0600  BP:  (!) 123/57  Pulse: 72 74  Resp: 16 16  Temp:  37.1 C  SpO2: 98% 99%    Last Pain:  Vitals:   06/05/18 0600  TempSrc:   PainSc: 0-No pain                 Catalina Gravel

## 2018-06-05 NOTE — Discharge Summary (Signed)
Physician Discharge Summary  Patient ID: Tracy Dixon MRN: [id number] DOB/AGE: [birth year] 77 y.o.  Admit date: 06/04/2018 Discharge date: 06/05/2018  Admission Diagnoses:  Discharge Diagnoses:  Active Problems:   Symptomatic mammary hypertrophy   Discharged Condition: good  Hospital Course: The patient was taken to the OR and underwent bilateral breast reductions.  She was managed in the post surgical unit with pain control.  She was walking, eating and comfortable at the time of discharge.  Consults: None  Significant Diagnostic Studies: none  Treatments: surgery  Discharge Exam: Blood pressure 118/68, pulse 72, temperature 98.6 F (37 C), resp. rate 16, height 5\' 1"  (1.549 m), weight 75 kg (165 lb 4 oz), SpO2 98 %. General appearance: alert, cooperative and no distress Breasts: normal appearance, no masses or tenderness Incision/Wound:  Disposition: Discharge disposition: 01-Home or Self Care       Discharge Instructions    Call MD for:  persistant nausea and vomiting   Complete by:  As directed    Call MD for:  severe uncontrolled pain   Complete by:  As directed    Call MD for:  temperature >100.4   Complete by:  As directed    Diet general   Complete by:  As directed    Discharge wound care:   Complete by:  As directed    Continue binder or sports bra.   Driving Restrictions   Complete by:  As directed    No driving while on pain medications.   Increase activity slowly   Complete by:  As directed    Lifting restrictions   Complete by:  As directed    No heavy lifting.     Allergies as of 06/05/2018      Reactions   Contrast Media [iodinated Diagnostic Agents] Hives   Crab [shellfish Allergy] Hives      Medication List    TAKE these medications   albuterol 108 (90 Base) MCG/ACT inhaler Commonly known as:  PROVENTIL HFA;VENTOLIN HFA Inhale 1-2 puffs into the lungs every 6 (six) hours as needed for shortness of breath (for use when in contact  with dogs or cats.).   amLODipine 10 MG tablet Commonly known as:  NORVASC TAKE 1 TABLET BY MOUTH  DAILY   aspirin 81 MG tablet Take 81 mg by mouth daily.   atorvastatin 20 MG tablet Commonly known as:  LIPITOR Take 1 tablet (20 mg total) by mouth daily.   cetirizine 10 MG tablet Commonly known as:  ZYRTEC Take 10 mg by mouth daily as needed for allergies. Reported on 01/10/2016   cholecalciferol 1000 units tablet Commonly known as:  VITAMIN D Take 1,000 Units by mouth daily.   hydrochlorothiazide 25 MG tablet Commonly known as:  HYDRODIURIL TAKE 1 TABLET BY MOUTH  DAILY   NON FORMULARY [facility]  Onychomycosis Nail Lacquer -  Fluconazole 2%, Terbinafine 1% DMSO Apply to affected nail once daily Qty. 120 gm 3 refills   potassium chloride SA 20 MEQ tablet Commonly known as:  K-DUR,KLOR-CON Take 1 tablet (20 mEq total) by mouth daily.            Discharge Care Instructions  (From admission, onward)        Start     Ordered   06/05/18 0000  Discharge wound care:    Comments:  Continue binder or sports bra.   06/05/18 0655     Follow-up Information    Dillingham, Loel Lofty, DO In 1 week.  Specialty:  Plastic Surgery Contact information: [place] [address] [phone]           Signed: Wallace Going 06/05/2018, 6:55 AM

## 2018-08-12 ENCOUNTER — Ambulatory Visit (INDEPENDENT_AMBULATORY_CARE_PROVIDER_SITE_OTHER): Payer: Medicare Other | Admitting: Podiatry

## 2018-08-12 ENCOUNTER — Encounter: Payer: Self-pay | Admitting: Podiatry

## 2018-08-12 DIAGNOSIS — Q828 Other specified congenital malformations of skin: Secondary | ICD-10-CM

## 2018-08-12 DIAGNOSIS — B351 Tinea unguium: Secondary | ICD-10-CM | POA: Diagnosis not present

## 2018-08-12 DIAGNOSIS — M79675 Pain in left toe(s): Secondary | ICD-10-CM | POA: Diagnosis not present

## 2018-08-12 DIAGNOSIS — M79674 Pain in right toe(s): Secondary | ICD-10-CM | POA: Diagnosis not present

## 2018-08-13 NOTE — Progress Notes (Signed)
Patient ID: Tracy Dixon, female   DOB: 1941-10-25, 77 y.o.   MRN: [id number]   Subjective: 77 y.o.-year-old female returns the office today for painful, elongated, thickened toenails which she is unable to trim herself as well as for painful callus to the ball of the right foot and for other calluses but also become painful. Denies any redness or drainage from the nail size with a callus sites.  No recent injury or trauma. She has no other concerns today. Denies any systemic complaints such as fevers, chills, nausea, vomiting.   Objective: AAO 3, NAD DP/PT pulses palpable, CRT less than 3 seconds Nails hypertrophic, dystrophic, elongated, brittle, discolored 10. There is tenderness overlying the nails 1-5 bilaterally. There is no surrounding erythema or drainage along the nail sites. Right submetatarsal 2  right foot with hyperkerotic lesion as well as the third toe.Marland Kitchen Upon debridement there is no underlying ulceration, drainage, or other signs of infection.  No other open lesions or other pre-ulcerative lesions are identified. No other areas of tenderness bilateral lower extremities. No overlying edema, erythema, increased warmth. No pain with calf compression, swelling, warmth, erythema.  Assessment: Patient presents with symptomatic onychomycosis; hyperkerotic lesion  Plan: -Treatment options including alternatives, risks, complications were discussed -Nails sharply debrided 10 without complication/bleeding.  Continue with topical antifungal that she has. -Sharply debrided the hyperkeratotic lesion right submetatarsal 2 without any complications or bleeding. -Hyperkeratotic lesion sharply debrided x 1 without any complications or bleeding on the right foot. -Follow-up in 9 weeks or sooner if any problems arise. In the meantime, encouraged to call the office with any questions, concerns, change in symptoms.   Celesta Gentile, DPM

## 2018-08-27 ENCOUNTER — Encounter: Payer: Self-pay | Admitting: Plastic Surgery

## 2018-08-27 ENCOUNTER — Ambulatory Visit (INDEPENDENT_AMBULATORY_CARE_PROVIDER_SITE_OTHER): Payer: Medicare Other | Admitting: Plastic Surgery

## 2018-08-27 VITALS — BP 150/80 | HR 100 | Resp 12 | Ht 62.0 in | Wt 160.0 lb

## 2018-08-27 DIAGNOSIS — Z9889 Other specified postprocedural states: Secondary | ICD-10-CM | POA: Insufficient documentation

## 2018-08-27 NOTE — Progress Notes (Signed)
   Subjective:    Patient ID: Tracy Dixon, female    DOB: 1940-12-21, 77 y.o.   MRN: [id number]  The patient is a 77 yrs old bf here for follow up on her bilateral breast reduction.  She was concerned about firmness in the left upper breast area.  It is only painful with firm pressure.  She is otherwise doing well.  There is no redness or sign of infection.  The area is firm at the superior aspect.  All incisions are well healed.  If feels like fat necrosis.  The other side is soft. No associated trauma.     Review of Systems  Constitutional: Negative.  Negative for activity change and appetite change.  HENT: Negative.   Eyes: Negative.   Respiratory: Negative.   Cardiovascular: Negative.   Gastrointestinal: Negative.   Endocrine: Negative.   Genitourinary: Negative.   Musculoskeletal: Negative.   Skin: Negative for color change and wound.  Hematological: Negative.   Psychiatric/Behavioral: Negative.    Current Outpatient Medications:  .  albuterol (PROVENTIL HFA;VENTOLIN HFA) 108 (90 BASE) MCG/ACT inhaler, Inhale 1-2 puffs into the lungs every 6 (six) hours as needed for shortness of breath (for use when in contact with dogs or cats.)., Disp: 3 Inhaler, Rfl: 0 .  amLODipine (NORVASC) 10 MG tablet, TAKE 1 TABLET BY MOUTH  DAILY, Disp: 90 tablet, Rfl: 1 .  atorvastatin (LIPITOR) 20 MG tablet, Take 1 tablet (20 mg total) by mouth daily., Disp: 30 tablet, Rfl: 11 .  cetirizine (ZYRTEC) 10 MG tablet, Take 10 mg by mouth daily as needed for allergies. Reported on 01/10/2016, Disp: , Rfl:  .  cholecalciferol (VITAMIN D) 1000 UNITS tablet, Take 1,000 Units by mouth daily., Disp: , Rfl:  .  hydrochlorothiazide (HYDRODIURIL) 25 MG tablet, TAKE 1 TABLET BY MOUTH  DAILY, Disp: 90 tablet, Rfl: 1 .  NON FORMULARY, [facility]  Onychomycosis Nail Lacquer -  Fluconazole 2%, Terbinafine 1% DMSO Apply to affected nail once daily Qty. 120 gm 3 refills, Disp: , Rfl:  .  potassium chloride SA  (K-DUR,KLOR-CON) 20 MEQ tablet, Take 1 tablet (20 mEq total) by mouth daily., Disp: 7 tablet, Rfl: 0      Objective:   Physical Exam  Constitutional: She appears well-developed and well-nourished.  HENT:  Head: Normocephalic and atraumatic.  Eyes: Pupils are equal, round, and reactive to light. EOM are normal.  Cardiovascular: Normal rate.  Pulmonary/Chest: Effort normal. No respiratory distress.  Neurological: She is alert.  Skin: Skin is warm.  Psychiatric: She has a normal mood and affect. Her behavior is normal.   Vitals:   08/27/18 1332  BP: (!) 150/80  Pulse: 100  Resp: 12  SpO2: 99%  Weight: 160 lb (72.6 kg)  Height: 5\' 2"  (1.575 m)       Assessment & Plan:  History of bilateral breast reduction surgery  Recommend massage to the area.  It is ok for her to exercise and swim.  Will need a yearly mammogram and she will be due for one in Jan / Feb.  She is aware.  I would like to see her back in 6 months.  Call if any change.

## 2018-09-04 ENCOUNTER — Ambulatory Visit (INDEPENDENT_AMBULATORY_CARE_PROVIDER_SITE_OTHER): Payer: Medicare Other

## 2018-09-04 DIAGNOSIS — Z23 Encounter for immunization: Secondary | ICD-10-CM | POA: Diagnosis not present

## 2018-10-03 ENCOUNTER — Other Ambulatory Visit: Payer: Self-pay | Admitting: Family Medicine

## 2018-10-14 ENCOUNTER — Ambulatory Visit (INDEPENDENT_AMBULATORY_CARE_PROVIDER_SITE_OTHER): Payer: Medicare Other | Admitting: Podiatry

## 2018-10-14 ENCOUNTER — Other Ambulatory Visit: Payer: Self-pay

## 2018-10-14 DIAGNOSIS — M79674 Pain in right toe(s): Secondary | ICD-10-CM | POA: Diagnosis not present

## 2018-10-14 DIAGNOSIS — M79675 Pain in left toe(s): Secondary | ICD-10-CM | POA: Diagnosis not present

## 2018-10-14 DIAGNOSIS — B351 Tinea unguium: Secondary | ICD-10-CM | POA: Diagnosis not present

## 2018-10-14 DIAGNOSIS — Q828 Other specified congenital malformations of skin: Secondary | ICD-10-CM | POA: Diagnosis not present

## 2018-10-16 NOTE — Progress Notes (Signed)
Patient ID: Tracy Dixon, female   DOB: 1941/03/24, 77 y.o.   MRN: [id number]   Subjective: 77 y.o.-year-old female returns the office today for painful, elongated, thickened toenails which she is unable to trim herself as well as for painful callus to the ball of the right foot and for other calluses but also become painful. Denies any redness or drainage from the nail size with a callus sites.  No recent injury or trauma. She has no other concerns today. Denies any systemic complaints such as fevers, chills, nausea, vomiting.   She is in the process of selling her house and she states she may take if off of the market for the holidays. Going to her daughters for Thanksgiving.   Objective: AAO 3, NAD DP/PT pulses palpable, CRT less than 3 seconds Nails hypertrophic, dystrophic, elongated, brittle, discolored 10. There is tenderness overlying the nails 1-5 bilaterally. There is no surrounding erythema or drainage along the nail sites. Right submetatarsal 2  right foot with hyperkerotic lesion as well as the third toe. Upon debridement there is no underlying ulceration, drainage, or other signs of infection.  No other open lesions or other pre-ulcerative lesions are identified. No pain with calf compression, swelling, warmth, erythema.  Assessment: Patient presents with symptomatic onychomycosis; hyperkerotic lesion  Plan: -Treatment options including alternatives, risks, complications were discussed -Nails sharply debrided 10 without complication/bleeding.  Continue with topical antifungal that she has. -Sharply debrided the hyperkeratotic lesion right submetatarsal 2 without any complications or bleeding. -Hyperkeratotic lesion sharply debrided x 1 without any complications or bleeding on the right foot. -Follow-up in 9 weeks or sooner if any problems arise. In the meantime, encouraged to call the office with any questions, concerns, change in symptoms.   Celesta Gentile, DPM

## 2018-11-05 ENCOUNTER — Telehealth: Payer: Self-pay | Admitting: Family Medicine

## 2018-11-05 NOTE — Telephone Encounter (Signed)
Left message asking pt to call office please r/s 12/26 appointment with dr Diona Browner

## 2018-11-14 ENCOUNTER — Ambulatory Visit: Payer: Medicare Other

## 2018-11-20 ENCOUNTER — Encounter: Payer: Medicare Other | Admitting: Family Medicine

## 2018-11-27 ENCOUNTER — Ambulatory Visit (INDEPENDENT_AMBULATORY_CARE_PROVIDER_SITE_OTHER): Payer: Medicare Other

## 2018-11-27 ENCOUNTER — Telehealth: Payer: Self-pay | Admitting: Family Medicine

## 2018-11-27 VITALS — BP 114/72 | HR 76 | Temp 97.5°F | Ht 61.0 in | Wt 168.5 lb

## 2018-11-27 DIAGNOSIS — Z0000 Encounter for general adult medical examination without abnormal findings: Secondary | ICD-10-CM

## 2018-11-27 DIAGNOSIS — E7800 Pure hypercholesterolemia, unspecified: Secondary | ICD-10-CM

## 2018-11-27 LAB — COMPREHENSIVE METABOLIC PANEL
ALK PHOS: 59 U/L (ref 39–117)
ALT: 11 U/L (ref 0–35)
AST: 13 U/L (ref 0–37)
Albumin: 3.9 g/dL (ref 3.5–5.2)
BILIRUBIN TOTAL: 0.4 mg/dL (ref 0.2–1.2)
BUN: 15 mg/dL (ref 6–23)
CO2: 30 meq/L (ref 19–32)
CREATININE: 0.8 mg/dL (ref 0.40–1.20)
Calcium: 9.2 mg/dL (ref 8.4–10.5)
Chloride: 100 mEq/L (ref 96–112)
GFR: 89.28 mL/min (ref >60.00)
GLUCOSE: 77 mg/dL (ref 70–99)
Potassium: 3.7 mEq/L (ref 3.5–5.1)
Sodium: 137 mEq/L (ref 135–145)
TOTAL PROTEIN: 7.9 g/dL (ref 6.0–8.3)

## 2018-11-27 LAB — LIPID PANEL
CHOL/HDL RATIO: 3
Cholesterol: 124 mg/dL (ref 0–200)
HDL: 43.7 mg/dL (ref >39.00)
LDL Cholesterol: 60 mg/dL (ref 0–99)
NONHDL: 80.67
Triglycerides: 101 mg/dL (ref 0.0–149.0)
VLDL: 20.2 mg/dL (ref 0.0–40.0)

## 2018-11-27 NOTE — Patient Instructions (Addendum)
Tracy Dixon , Thank you for taking time to come for your Medicare Wellness Visit. I appreciate your ongoing commitment to your health goals. Please review the following plan we discussed and let me know if I can assist you in the future.   These are the goals we discussed: Goals    . Patient Stated     Starting 11/27/2018, I will continue to take medications as prescribed.        This is a list of the screening recommended for you and due dates:  Health Maintenance  Topic Date Due  . Tetanus Vaccine  01/24/2017  . Flu Shot  Completed  . DEXA scan (bone density measurement)  Completed  . Pneumonia vaccines  Completed  Preventive Care for Adults  A healthy lifestyle and preventive care can promote health and wellness. Preventive health guidelines for adults include the following key practices.  . A routine yearly physical is a good way to check with your health care provider about your health and preventive screening. It is a chance to share any concerns and updates on your health and to receive a thorough exam.  . Visit your dentist for a routine exam and preventive care every 6 months. Brush your teeth twice a day and floss once a day. Good oral hygiene prevents tooth decay and gum disease.  . The frequency of eye exams is based on your age, health, family medical history, use  of contact lenses, and other factors. Follow your health care provider's recommendations for frequency of eye exams.  . Eat a healthy diet. Foods like vegetables, fruits, whole grains, low-fat dairy products, and lean protein foods contain the nutrients you need without too many calories. Decrease your intake of foods high in solid fats, added sugars, and salt. Eat the right amount of calories for you. Get information about a proper diet from your health care provider, if necessary.  . Regular physical exercise is one of the most important things you can do for your health. Most adults should get at least 150  minutes of moderate-intensity exercise (any activity that increases your heart rate and causes you to sweat) each week. In addition, most adults need muscle-strengthening exercises on 2 or more days a week.  Silver Sneakers may be a benefit available to you. To determine eligibility, you may visit the website: www.silversneakers.com or contact program at 406-708-4315 Mon-Fri between 8AM-8PM.   . Maintain a healthy weight. The body mass index (BMI) is a screening tool to identify possible weight problems. It provides an estimate of body fat based on height and weight. Your health care provider can find your BMI and can help you achieve or maintain a healthy weight.   For adults 20 years and older: ? A BMI below 18.5 is considered underweight. ? A BMI of 18.5 to 24.9 is normal. ? A BMI of 25 to 29.9 is considered overweight. ? A BMI of 30 and above is considered obese.   . Maintain normal blood lipids and cholesterol levels by exercising and minimizing your intake of saturated fat. Eat a balanced diet with plenty of fruit and vegetables. Blood tests for lipids and cholesterol should begin at age 62 and be repeated every 5 years. If your lipid or cholesterol levels are high, you are over 50, or you are at high risk for heart disease, you may need your cholesterol levels checked more frequently. Ongoing high lipid and cholesterol levels should be treated with medicines if diet and exercise are  not working.  . If you smoke, find out from your health care provider how to quit. If you do not use tobacco, please do not start.  . If you choose to drink alcohol, please do not consume more than 2 drinks per day. One drink is considered to be 12 ounces (355 mL) of beer, 5 ounces (148 mL) of wine, or 1.5 ounces (44 mL) of liquor.  . If you are 69-25 years old, ask your health care provider if you should take aspirin to prevent strokes.  . Use sunscreen. Apply sunscreen liberally and repeatedly throughout  the day. You should seek shade when your shadow is shorter than you. Protect yourself by wearing long sleeves, pants, a wide-brimmed hat, and sunglasses year round, whenever you are outdoors.  . Once a month, do a whole body skin exam, using a mirror to look at the skin on your back. Tell your health care provider of new moles, moles that have irregular borders, moles that are larger than a pencil eraser, or moles that have changed in shape or color.

## 2018-11-27 NOTE — Progress Notes (Signed)
Subjective:   Tracy Dixon is a 78 y.o. female who presents for Medicare Annual (Subsequent) preventive examination.  Review of Systems:  As noted Cardiac Risk Factors include: advanced age (>30men, >43 women);obesity (BMI >30kg/m2);dyslipidemia;hypertension     Objective:     Vitals: BP 114/72 (BP Location: Left Arm, Patient Position: Sitting, Cuff Size: Normal)   Pulse 76   Temp (!) 97.5 F (36.4 C) (Oral)   Ht 5\' 1"  (1.549 m) Comment: no shoes  Wt 168 lb 8 oz (76.4 kg)   SpO2 97%   BMI 31.84 kg/m   Body mass index is 31.84 kg/m.  Advanced Directives 11/27/2018 06/04/2018 05/27/2018 11/12/2017 10/16/2016 06/11/2013 06/02/2013  Does Patient Have a Medical Advance Directive? No No No No Yes;No Patient does not have advance directive;Patient would not like information Patient does not have advance directive;Patient would not like information  Would patient like information on creating a medical advance directive? No - Patient declined No - Patient declined No - Patient declined Yes (MAU/Ambulatory/Procedural Areas - Information given) No - Patient declined - -  Pre-existing out of facility DNR order (yellow form or pink MOST form) - - - - - No No    Tobacco Social History   Tobacco Use  Smoking Status Former Smoker  . Years: 15.00  . Types: Cigarettes  . Last attempt to quit: 06/02/1973  . Years since quitting: 45.5  Smokeless Tobacco Never Used  Tobacco Comment   few cigs/day     Counseling given: No Comment: few cigs/day   Clinical Intake:  Pre-visit preparation completed: Yes  Pain : No/denies pain Pain Score: 0-No pain     Nutritional Status: BMI 25 -29 Overweight Nutritional Risks: None Diabetes: No  How often do you need to have someone help you when you read instructions, pamphlets, or other written materials from your doctor or pharmacy?: 1 - Never What is the last grade level you completed in school?: Bachelor degree  Interpreter Needed?:  No  Comments: pt lives with spouse Information entered by :: LPinson, LPN  Past Medical History:  Diagnosis Date  . 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 Allergy   . Arthritis   . Asthma   . Complication of anesthesia    stays sleepy  . Diverticulosis of colon   . Gallstones   . Hyperlipidemia   . Hypertension   . Osteoarthritis   . Symptomatic mammary hypertrophy    Past Surgical History:  Procedure Laterality Date  . Bilateral tubal ligation  2005  . BREAST REDUCTION SURGERY Bilateral 06/04/2018   Procedure: MAMMARY REDUCTION  (BREAST);  Surgeon: Wallace Going, DO;  Location: [facility];  Service: Plastics;  Laterality: Bilateral;  . Carotid doppler  07/2006   Neg  . CESAREAN SECTION  2005  . CHOLECYSTECTOMY  1982  . DECOMPRESSIVE LUMBAR LAMINECTOMY LEVEL 3  06/10/2013   Procedure: DECOMPRESSIVE LUMBAR LAMINECTOMY LEVEL 3;  Surgeon: Melina Schools, MD;  Location: [facility] [facility];  Service: Orthopedics;;  lumbar three to five decompression  . MRI brain  2007   acoustic neuroma (check yearly with MRI)  . PFT's  12/2006   (-) FEV1/FVC 79%, FEV1 84%  . REPLACEMENT TOTAL KNEE BILATERAL  2005  . TONSILLECTOMY  2005  . TUBAL LIGATION     Family History  Problem Relation Age of Onset  . Cancer Mother        Lung CA  .  Stroke Mother        CVA  . Cancer Father        Prostate  . Heart disease Neg Hx    Social History   Socioeconomic History  . Marital status: Married    Spouse name: Not on file  . Number of children: 4  . Years of education: Not on file  . Highest education level: Not on file  Occupational History  . 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 Financial resource strain: Not on file  . Food insecurity:    Worry: Not on file    Inability: Not on file  . Transportation needs:    Medical: Not on file    Non-medical: Not on file  Tobacco Use  . Smoking status:  Former Smoker    Years: 15.00    Types: Cigarettes    Last attempt to quit: 06/02/1973    Years since quitting: 45.5  . Smokeless tobacco: Never Used  . Tobacco comment: few cigs/day  Substance and Sexual Activity  . Alcohol use: No  . Drug use: No  . Sexual activity: Not on file  Lifestyle  . Physical activity:    Days per week: Not on file    Minutes per session: Not on file  . Stress: Not on file  Relationships  . Social connections:    Talks on phone: Not on file    Gets together: Not on file    Attends religious service: Not on file    Active member of club or organization: Not on file    Attends meetings of clubs or organizations: Not on file    Relationship status: Not on file  Other Topics Concern  . Not on file  Social History Narrative   Regular exercise:  No   Diet:  Fruits and veggies, rare FF, no water    No living will, no HCPOA.    Outpatient Encounter Medications as of 11/27/2018  Medication Sig  . albuterol (PROVENTIL HFA;VENTOLIN HFA) 108 (90 BASE) MCG/ACT inhaler Inhale 1-2 puffs into the lungs every 6 (six) hours as needed for shortness of breath (for use when in contact with dogs or cats.).  Marland Kitchen amLODipine (NORVASC) 10 MG tablet TAKE 1 TABLET BY MOUTH  DAILY  . atorvastatin (LIPITOR) 20 MG tablet Take 1 tablet (20 mg total) by mouth daily.  . cetirizine (ZYRTEC) 10 MG tablet Take 10 mg by mouth daily as needed for allergies. Reported on 01/10/2016  . cholecalciferol (VITAMIN D) 1000 UNITS tablet Take 1,000 Units by mouth daily.  . hydrochlorothiazide (HYDRODIURIL) 25 MG tablet TAKE 1 TABLET BY MOUTH  DAILY  . [DISCONTINUED] NON FORMULARY [facility]  Onychomycosis Nail Lacquer -  Fluconazole 2%, Terbinafine 1% DMSO Apply to affected nail once daily Qty. 120 gm 3 refills  . [DISCONTINUED] potassium chloride SA (K-DUR,KLOR-CON) 20 MEQ tablet Take 1 tablet (20 mEq total) by mouth daily.   No facility-administered encounter medications on file as of  11/27/2018.     Activities of Daily Living In your present state of health, do you have any difficulty performing the following activities: 11/27/2018 06/04/2018  Hearing? 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 N N  Difficulty concentrating or making decisions? N N  Walking or climbing stairs? N N  Dressing or bathing? N N  Doing errands, shopping? N -  Preparing Food and eating ? N -  Using the Toilet? N -  In the past six months, have you accidently leaked urine? N -  Do you have problems with loss of bowel control? N -  Managing your Medications? N -  Managing your Finances? N -  Housekeeping or managing your Housekeeping? N -  Some recent data might be hidden    Patient Care Team: [name], MD as PCP - General Sanjuana Kava, MD as Referring Physician (Otolaryngology) Melina Schools, MD as Consulting Physician (Orthopedic Surgery) Roseanne Kaufman, MD as Consulting Physician (Orthopedic Surgery) Paralee Cancel, MD as Consulting Physician (Orthopedic Surgery)    Assessment:   This is a routine wellness examination for Elliotte.   Hearing Screening   125Hz  250Hz  500Hz  1000Hz  2000Hz  3000Hz  4000Hz  6000Hz  8000Hz   Right ear:   40 40 40  40    Left ear:   0 0 0  0    Comments: Left ear - acoustic neuroma  Vision Screening Comments: Vision exam in April 2019 with Dr. Renato Battles    Exercise Activities and Dietary recommendations Current Exercise Habits: The patient does not participate in regular exercise at present, Exercise limited by: None identified  Goals    . Patient Stated     Starting 11/27/2018, I will continue to take medications as prescribed.        Fall Risk Fall Risk  11/27/2018 10/14/2018 11/12/2017 10/16/2016 10/11/2015  Falls in the past year? 1 1 Yes No No  Comment single fall as result of tripping Emmi Telephone Survey: data to providers prior to load - - -  Number falls in past yr: 0 1 2 or more - -  Comment - Emmi Telephone  Survey Actual Response = 1 - - -  Injury with Fall? 1 0 No - -   Depression Screen PHQ 2/9 Scores 11/27/2018 11/12/2017 10/16/2016 10/11/2015  PHQ - 2 Score 0 0 0 0  PHQ- 9 Score 0 0 - -     Cognitive Function MMSE - Mini Mental State Exam 11/27/2018 11/12/2017  Orientation to time 5 5  Orientation to Place 5 5  Registration 3 3  Attention/ Calculation 0 0  Recall 3 3  Language- name 2 objects 0 0  Language- repeat 1 1  Language- follow 3 step command 3 2  Language- follow 3 step command-comments - unable to follow 1 step of 3 step command  Language- read & follow direction 0 0  Write a sentence 0 0  Copy design 0 0  Total score 20 19     PLEASE NOTE: A Mini-Cog screen was completed. Maximum score is 20. A value of 0 denotes this part of Folstein MMSE was not completed or the patient failed this part of the Mini-Cog screening.   Mini-Cog Screening Orientation to Time - Max 5 pts Orientation to Place - Max 5 pts Registration - Max 3 pts Recall - Max 3 pts Language Repeat - Max 1 pts Language Follow 3 Step Command - Max 3 pts     Immunization History  Administered Date(s) Administered  . H1N1 01/12/2009  . Influenza Split 08/28/2011, 08/28/2012  . Influenza Whole 09/19/2007, 08/23/2008, 09/07/2009, 08/30/2010  . Influenza,inj,Quad PF,6+ Mos 08/04/2013, 08/24/2014, 09/02/2015, 08/28/2016, 09/03/2017, 09/04/2018  . Pneumococcal Conjugate-13 09/30/2014  . Pneumococcal Polysaccharide-23 09/21/2008  . Td 01/25/2007  . Zoster 08/20/2013    Screening Tests Health Maintenance  Topic Date Due  . TETANUS/TDAP  01/24/2017  . INFLUENZA VACCINE  Completed  . DEXA SCAN  Completed  . PNA vac Low Risk Adult  Completed      Plan:     I have personally reviewed, addressed, and noted the following in the patient's chart:  A. Medical and social history B. Use of alcohol, tobacco or illicit drugs  C. Current medications and supplements D. Functional ability and status E.   Nutritional status F.  Physical activity G. Advance directives H. List of other physicians I.  Hospitalizations, surgeries, and ER visits in previous 12 months J.  [place] to include hearing, vision, cognitive, depression L. Referrals and appointments - none  In addition, I have reviewed and discussed with patient certain preventive protocols, quality metrics, and best practice recommendations. A written personalized care plan for preventive services as well as general preventive health recommendations were provided to patient.  See attached scanned questionnaire for additional information.   Signed,   Lindell Noe, MHA, BS, LPN Health Coach

## 2018-11-27 NOTE — Telephone Encounter (Signed)
-----   Message from Eustace Pen, LPN sent at 11/10/2445  1:31 PM EST ----- Regarding: labs 1/2 Lab orders needed. Thank you.  Insurance:  [employer]

## 2018-11-27 NOTE — Progress Notes (Signed)
PCP notes:   Health maintenance:  Tetanus vaccine - postponed/insurance  Abnormal screenings:   Hearing - failed  Hearing Screening   125Hz  250Hz  500Hz  1000Hz  2000Hz  3000Hz  4000Hz  6000Hz  8000Hz   Right ear:   40 40 40  40    Left ear:   0 0 0  0    Comments: Left ear - acoustic neuroma  Fall risk - hx of single fall Fall Risk  11/27/2018 10/14/2018 11/12/2017 10/16/2016 10/11/2015  Falls in the past year? 1 1 Yes No No  Comment single fall as result of tripping Emmi Telephone Survey: data to providers prior to load - - -  Number falls in past yr: 0 1 2 or more - -  Comment - Emmi Telephone Survey Actual Response = 1 - - -  Injury with Fall? 1 0 No - -    Patient concerns:   None  Nurse concerns:  None  Next PCP appt:   12/04/18 @ 1500

## 2018-11-27 NOTE — Progress Notes (Signed)
I reviewed health advisor's note, was available for consultation, and agree with documentation and plan.   Signed,  Climmie Cronce T. Margurette Brener, MD  

## 2018-12-03 ENCOUNTER — Encounter: Payer: Self-pay | Admitting: *Deleted

## 2018-12-04 ENCOUNTER — Ambulatory Visit (INDEPENDENT_AMBULATORY_CARE_PROVIDER_SITE_OTHER): Payer: Medicare Other | Admitting: Family Medicine

## 2018-12-04 ENCOUNTER — Encounter: Payer: Self-pay | Admitting: Family Medicine

## 2018-12-04 ENCOUNTER — Encounter: Payer: Self-pay | Admitting: *Deleted

## 2018-12-04 VITALS — BP 141/71 | HR 94 | Temp 97.4°F | Ht 61.0 in | Wt 167.8 lb

## 2018-12-04 DIAGNOSIS — D333 Benign neoplasm of cranial nerves: Secondary | ICD-10-CM | POA: Diagnosis not present

## 2018-12-04 DIAGNOSIS — E7800 Pure hypercholesterolemia, unspecified: Secondary | ICD-10-CM | POA: Diagnosis not present

## 2018-12-04 DIAGNOSIS — I872 Venous insufficiency (chronic) (peripheral): Secondary | ICD-10-CM | POA: Diagnosis not present

## 2018-12-04 DIAGNOSIS — I10 Essential (primary) hypertension: Secondary | ICD-10-CM | POA: Diagnosis not present

## 2018-12-04 NOTE — Patient Instructions (Addendum)
Call to set up mammogram on own.  Get back to regular exercise .. goal  150 min per week.  Water aerobics is a great idea.

## 2018-12-04 NOTE — Progress Notes (Signed)
Subjective:    Patient ID: Tracy Dixon, female    DOB: 1941-11-04, 78 y.o.   MRN: [id number]  HPI  The patient presents for  complete physical and review of chronic health problems. He/She also has the following acute concerns today: has issues needing to belch after eating.. has noted for a long time.  No chest pain, no sour taste, no abd pain.  no food getting stuck.  Nml BMs , regular. No blood in stool  The patient saw Candis Musa, LPN for medicare wellness. Note reviewed in detail and important notes copied below.  Health maintenance:  Tetanus vaccine - postponed/insurance  Abnormal screenings:   Hearing - failed             Hearing Screening   125Hz  250Hz  500Hz  1000Hz  2000Hz  3000Hz  4000Hz  6000Hz  8000Hz   Right ear:   40 40 40  40    Left ear:   0 0 0  0    Comments: Left ear - acoustic neuroma  Fall risk - hx of single fall Fall Risk  11/27/2018 10/14/2018 11/12/2017 10/16/2016 10/11/2015  Falls in the past year? 1 1 Yes No No  Comment single fall as result of tripping Emmi Telephone Survey: data to providers prior to load - - -  Number falls in past yr: 0 1 2 or more - -  Comment - Emmi Telephone Survey Actual Response = 1 - - -  Injury with Fall? 1 0 No - -     12/04/18  Elevated Cholesterol: LDL at goal on statin (lipitor 20) Lab Results  Component Value Date   CHOL 124 11/27/2018   HDL 43.70 11/27/2018   LDLCALC 60 11/27/2018   TRIG 101.0 11/27/2018   CHOLHDL 3 11/27/2018  Using medications without problems: Muscle aches:  Diet compliance: modearte Exercise: occassional Other complaints: Wt Readings from Last 3 Encounters:  12/04/18 167 lb 12 oz (76.1 kg)  11/27/18 168 lb 8 oz (76.4 kg)  08/27/18 160 lb (72.6 kg)   Body mass index is 31.7 kg/m.   Left acoutic neuroma; Followed by ENT.  Hypertension:   Borderline control in office today on amlodipine, HCTZ Using medication without problems or lightheadedness:  none Chest pain with exertion:none Edema:none Short of breath: none Average home BPs: Other issues:  Social History /Family History/Past Medical History reviewed in detail and updated in EMR if needed. Blood pressure (!) 141/71, pulse 94, temperature (!) 97.4 F (36.3 C), temperature source Oral, height 5\' 1"  (1.549 m), weight 167 lb 12 oz (76.1 kg), SpO2 97 %.  Review of Systems  Constitutional: Negative for fatigue and fever.  HENT: Negative for congestion.   Eyes: Negative for pain.  Respiratory: Negative for cough and shortness of breath.   Cardiovascular: Negative for chest pain, palpitations and leg swelling.  Gastrointestinal: Negative for abdominal pain.  Genitourinary: Negative for dysuria and vaginal bleeding.  Musculoskeletal: Negative for back pain.  Neurological: Negative for syncope, light-headedness and headaches.  Psychiatric/Behavioral: Negative for dysphoric mood.       Objective:   Physical Exam Constitutional:      General: She is not in acute distress.    Appearance: Normal appearance. She is well-developed. She is not ill-appearing or toxic-appearing.  HENT:     Head: Normocephalic.     Right Ear: Hearing, tympanic membrane, ear canal and external ear normal.     Left Ear: Hearing, tympanic membrane, ear canal and external ear normal.     Nose: Nose  normal.  Eyes:     General: Lids are normal. Lids are everted, no foreign bodies appreciated.     Conjunctiva/sclera: Conjunctivae normal.     Pupils: Pupils are equal, round, and reactive to light.  Neck:     Musculoskeletal: Normal range of motion and neck supple.     Thyroid: No thyroid mass or thyromegaly.     Vascular: No carotid bruit.     Trachea: Trachea normal.  Cardiovascular:     Rate and Rhythm: Normal rate and regular rhythm.     Heart sounds: Normal heart sounds, S1 normal and S2 normal. No murmur. No gallop.   Pulmonary:     Effort: Pulmonary effort is normal. No respiratory distress.      Breath sounds: Normal breath sounds. No wheezing, rhonchi or rales.  Chest:       Comments: Left breast firm nodule noted after breast reduction.. fatty tissue per surgeon. Abdominal:     General: Bowel sounds are normal. There is no distension or abdominal bruit.     Palpations: Abdomen is soft. There is no fluid wave or mass.     Tenderness: There is no abdominal tenderness. There is no guarding or rebound.     Hernia: No hernia is present.  Musculoskeletal:     Left lower leg: Edema present.  Lymphadenopathy:     Cervical: No cervical adenopathy.  Skin:    General: Skin is warm and dry.     Findings: No rash.  Neurological:     Mental Status: She is alert.     Cranial Nerves: No cranial nerve deficit.     Sensory: No sensory deficit.  Psychiatric:        Mood and Affect: Mood is not anxious or depressed.        Speech: Speech normal.        Behavior: Behavior normal. Behavior is cooperative.        Judgment: Judgment normal.           Assessment & Plan:  The patient's preventative maintenance and recommended screening tests for an annual wellness exam were reviewed in full today. Brought up to date unless services declined.  Counselled on the importance of diet, exercise, and its role in overall health and mortality. The patient's FH and SH was reviewed, including their home life, tobacco status, and drug and alcohol status.   Vaccines:uptodate, refused tdap Pap/DVE:No pap/no DVE indicated,No family history of uterine or ovarian cancer. No vaginal bleeding, no discharge. Mammo: 12/2017.. planned Bone Density:12/2017 stable osteopenia , repeat in 2-5 years Colon:2008 nml colonoscopy, cologuard 01/2018, repeat in 2022 Smoking Status: nonsmoker Left ear decreased hearing given acoustic neuroma.

## 2018-12-05 ENCOUNTER — Other Ambulatory Visit: Payer: Self-pay | Admitting: Family Medicine

## 2018-12-05 DIAGNOSIS — N6320 Unspecified lump in the left breast, unspecified quadrant: Secondary | ICD-10-CM

## 2018-12-16 ENCOUNTER — Ambulatory Visit (INDEPENDENT_AMBULATORY_CARE_PROVIDER_SITE_OTHER): Payer: Medicare Other | Admitting: Podiatry

## 2018-12-16 DIAGNOSIS — M79674 Pain in right toe(s): Secondary | ICD-10-CM

## 2018-12-16 DIAGNOSIS — M79675 Pain in left toe(s): Secondary | ICD-10-CM

## 2018-12-16 DIAGNOSIS — B351 Tinea unguium: Secondary | ICD-10-CM | POA: Diagnosis not present

## 2018-12-16 DIAGNOSIS — Q828 Other specified congenital malformations of skin: Secondary | ICD-10-CM | POA: Diagnosis not present

## 2018-12-17 NOTE — Progress Notes (Signed)
Patient ID: Tracy Dixon, female   DOB: [birth year], 78 y.o.   MRN: [id number]   Subjective: 78 y.o.-year-old female returns the office today for painful, elongated, thickened toenails which she is unable to trim herself as well as for painful callus to her feet that are painful. Denies any redness or drainage from the nail size with a callus sites.  No recent injury or trauma. She has no other concerns today. Denies any systemic complaints such as fevers, chills, nausea, vomiting.   Objective: AAO 3, NAD DP/PT pulses palpable, CRT less than 3 seconds Nails hypertrophic, dystrophic, elongated, brittle, discolored 10. There is tenderness overlying the nails 1-5 bilaterally. There is no surrounding erythema or drainage along the nail sites. Right submetatarsal 2  right foot with hyperkerotic lesion as well as the third toe. Upon debridement there is no underlying ulceration, drainage, or other signs of infection.  No other open lesions or other pre-ulcerative lesions are identified. No pain with calf compression, swelling, warmth, erythema.  Assessment: Symptomatic onychomycosis; hyperkerotic lesion  Plan: -Treatment options including alternatives, risks, complications were discussed -Nails sharply debrided 10 without complication/bleeding.  Continue with topical antifungal that she has. -Sharply debrided the hyperkeratotic lesion right submetatarsal 2 without any complications or bleeding. -Follow-up in 9 weeks or sooner if any problems arise. In the meantime, encouraged to call the office with any questions, concerns, change in symptoms.   Celesta Gentile, DPM

## 2018-12-27 ENCOUNTER — Other Ambulatory Visit: Payer: Self-pay | Admitting: Family Medicine

## 2018-12-29 ENCOUNTER — Other Ambulatory Visit: Payer: Self-pay | Admitting: Family Medicine

## 2019-01-01 ENCOUNTER — Other Ambulatory Visit: Payer: Medicare Other

## 2019-01-03 IMAGING — MG MM DIGITAL SCREENING BILAT W/ TOMO W/ CAD
8 of 13 series · 8 of 29 positions shown · non-contrast
Comparison: Previous exam(s).

ACR Breast Density Category a: The breast tissue is almost entirely
fatty.

CLINICAL DATA: Screening.

EXAM:
2D DIGITAL SCREENING BILATERAL MAMMOGRAM WITH 3D TOMO WITH CAD

[L MLO (1 of 2)]
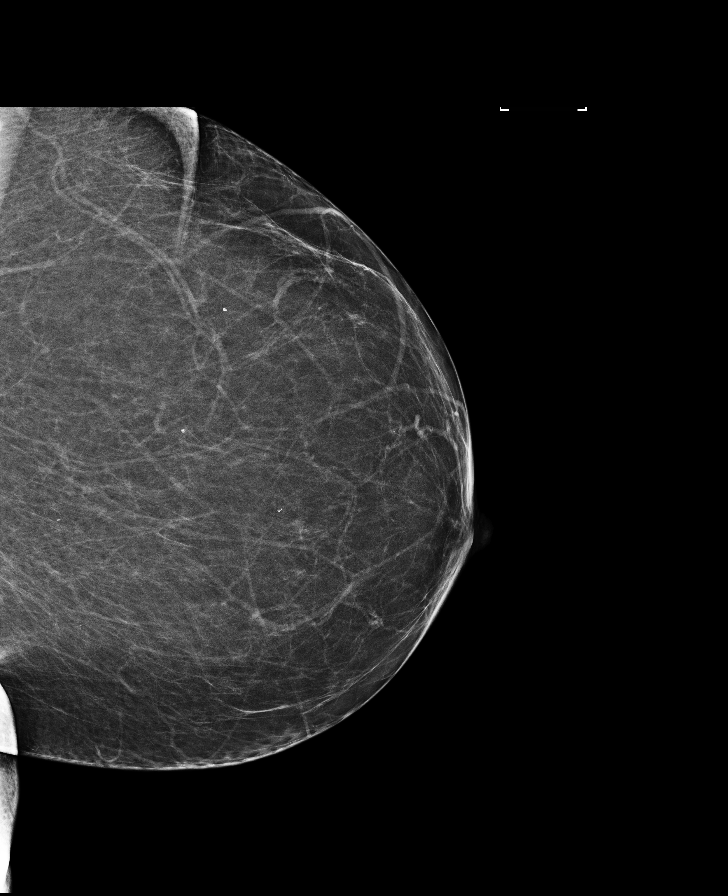

[R MLO]
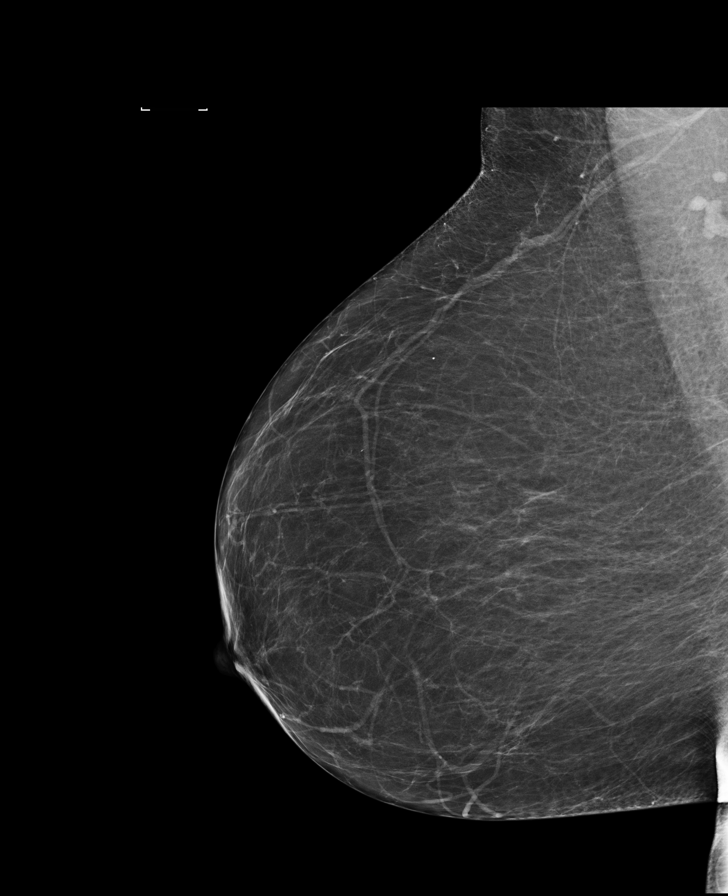

[R MLO synth-2D]
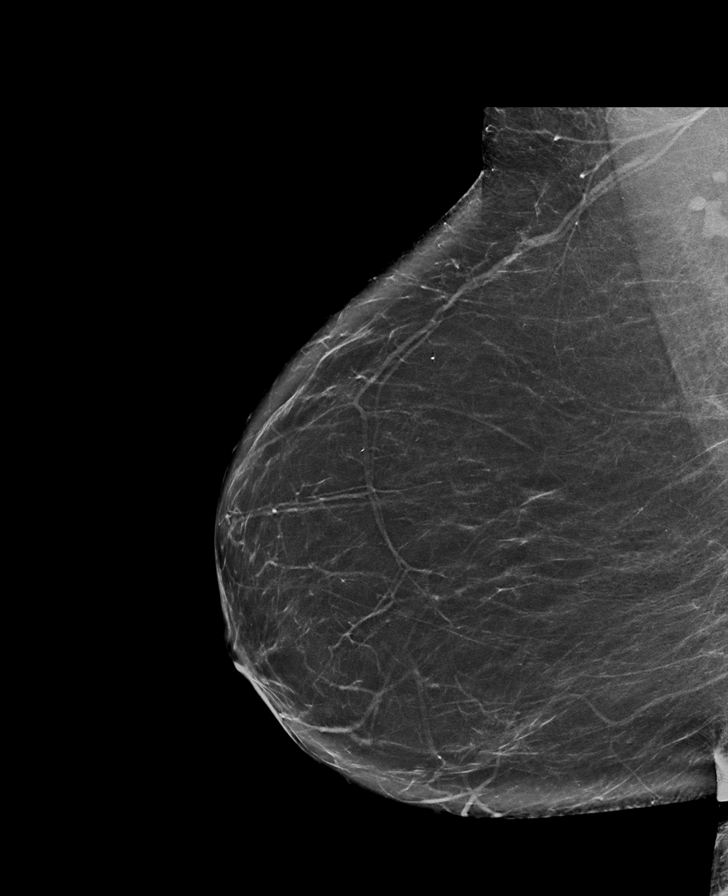

[R CC]
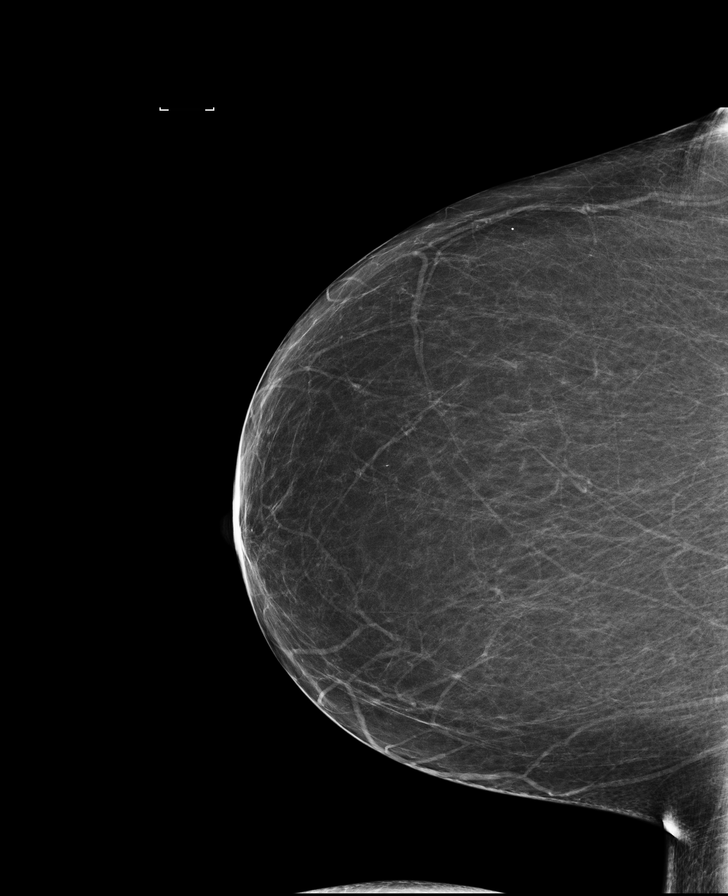

[L CC synth-2D]
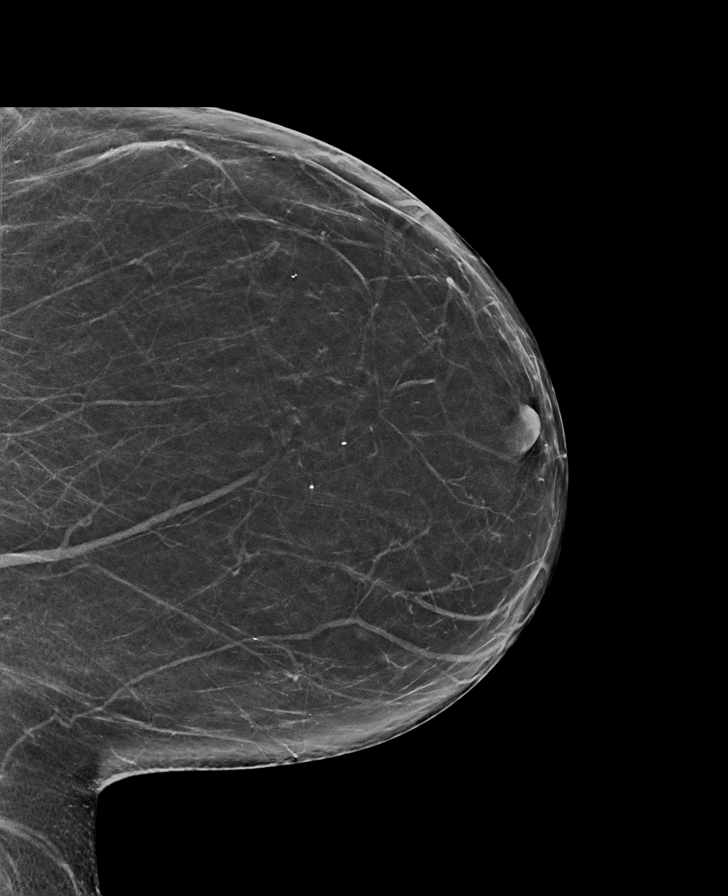

[L MLO (2 of 2)]
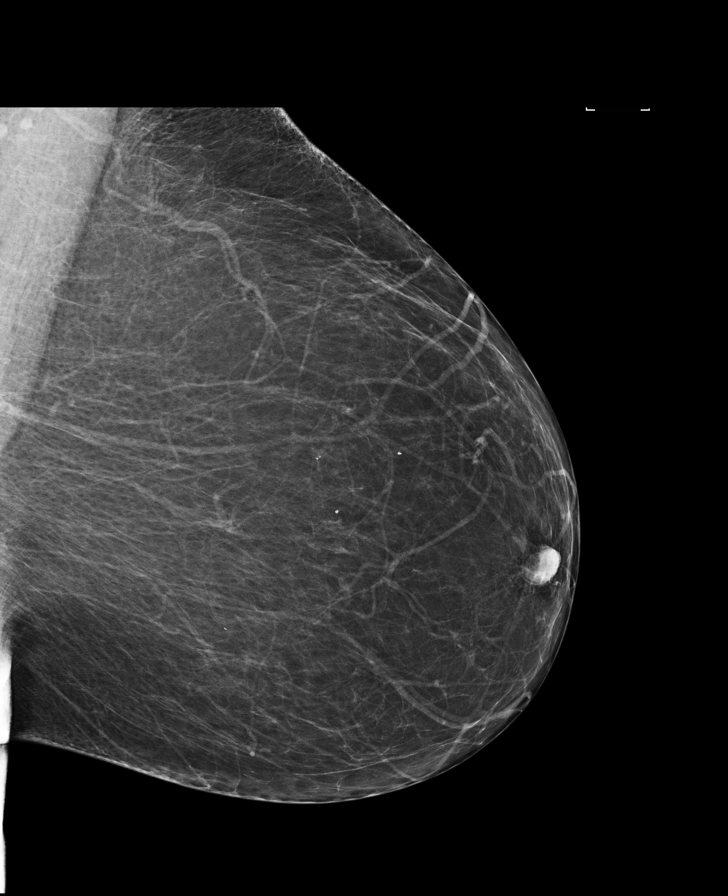

[L CC]
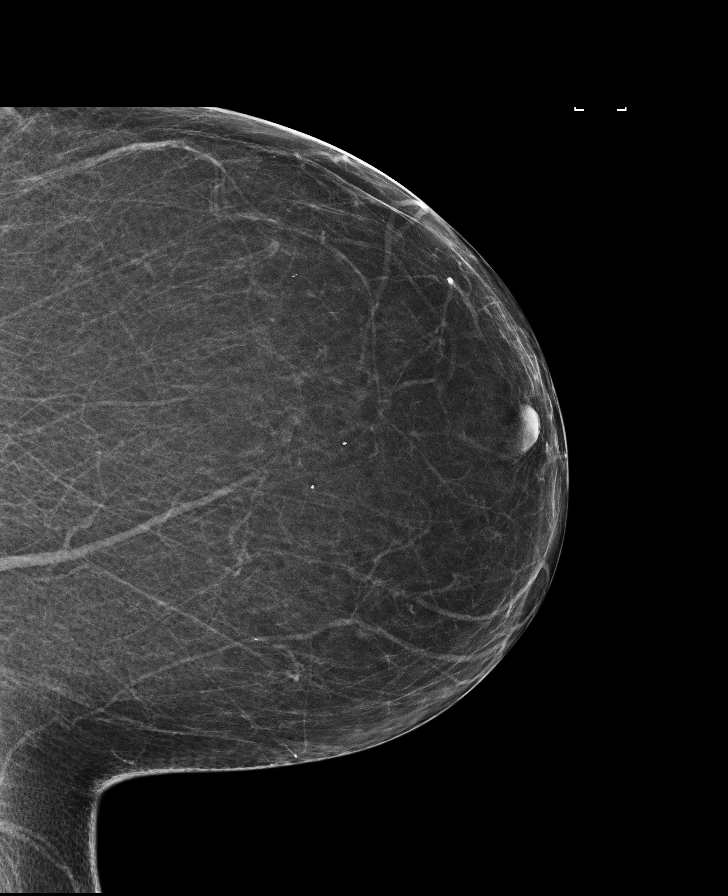

[L MLO synth-2D]
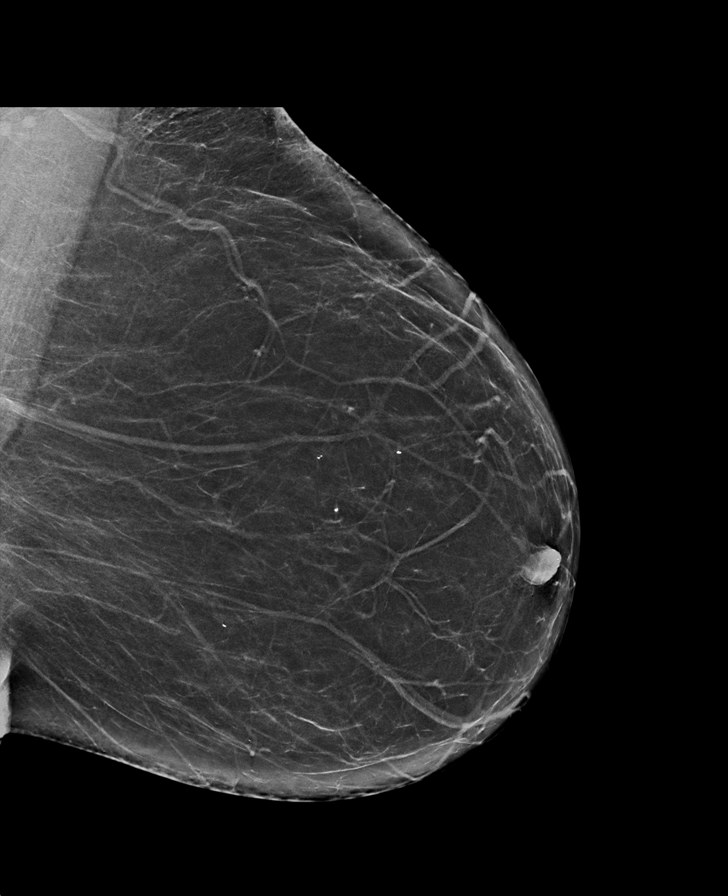

[8 of 29 positions shown; findings below may reference images not displayed]

FINDINGS: There are no findings suspicious for malignancy. Images were
processed with CAD.
IMPRESSION: No mammographic evidence of malignancy. A result letter of this
screening mammogram will be mailed directly to the patient.

RECOMMENDATION:
Screening mammogram in one year. (Code:25-W-ROJ)

BI-RADS CATEGORY  1: Negative.

## 2019-01-06 ENCOUNTER — Ambulatory Visit
Admission: RE | Admit: 2019-01-06 | Discharge: 2019-01-06 | Disposition: A | Discharge status: Home / Self Care | Payer: Medicare Other | Source: Ambulatory Visit | Attending: Family Medicine | Admitting: Family Medicine

## 2019-01-06 DIAGNOSIS — N6320 Unspecified lump in the left breast, unspecified quadrant: Secondary | ICD-10-CM

## 2019-01-06 DIAGNOSIS — N6489 Other specified disorders of breast: Secondary | ICD-10-CM | POA: Diagnosis not present

## 2019-01-06 DIAGNOSIS — N641 Fat necrosis of breast: Secondary | ICD-10-CM | POA: Diagnosis not present

## 2019-01-06 DIAGNOSIS — R928 Other abnormal and inconclusive findings on diagnostic imaging of breast: Secondary | ICD-10-CM | POA: Diagnosis not present

## 2019-02-17 ENCOUNTER — Ambulatory Visit: Payer: Medicare Other | Admitting: Podiatry

## 2019-02-19 ENCOUNTER — Ambulatory Visit: Payer: Medicare Other | Admitting: Podiatry

## 2019-02-27 ENCOUNTER — Telehealth: Payer: Self-pay

## 2019-02-27 ENCOUNTER — Ambulatory Visit: Payer: Medicare Other | Admitting: Plastic Surgery

## 2019-02-27 NOTE — Telephone Encounter (Signed)
Call back to pt to inform her of Dr. Eusebio Friendly orders as follows: No underwire bra, wear bra for sleep, massage lump/area near (L) nipple for one month She reviewed mammogram & u/s- no suspicious mass or malignancy Pt will keep her f/u appointment on 04/17/19 She is instructed to call for any concerns  Shafer

## 2019-03-06 ENCOUNTER — Ambulatory Visit: Payer: Medicare Other | Admitting: Plastic Surgery

## 2019-03-13 ENCOUNTER — Ambulatory Visit (INDEPENDENT_AMBULATORY_CARE_PROVIDER_SITE_OTHER): Payer: Medicare Other | Admitting: Family Medicine

## 2019-03-13 ENCOUNTER — Encounter: Payer: Self-pay | Admitting: Family Medicine

## 2019-03-13 VITALS — BP 134/96 | HR 96 | Temp 97.9°F | Ht 61.0 in

## 2019-03-13 DIAGNOSIS — M5386 Other specified dorsopathies, lumbar region: Secondary | ICD-10-CM

## 2019-03-13 MED ORDER — PREDNISONE 20 MG PO TABS: ORAL_TABLET | ORAL | 0 refills | Status: DC

## 2019-03-13 MED ORDER — ATORVASTATIN CALCIUM 20 MG PO TABS: 20.0000 mg | ORAL_TABLET | Freq: Every day | ORAL | 2 refills | Status: DC

## 2019-03-13 NOTE — Telephone Encounter (Signed)
Please call this pt to set up a [email or web address] OV today for low back pain! See Mychart note

## 2019-03-13 NOTE — Patient Instructions (Signed)
Tonight use ibuprofen 800 mg x 1 with a meal.  Start heat, gentle stretching and massage. Start in AM prednsione taper.  Call if not improving in 2 weeks. Back Exercises The following exercises strengthen the muscles that help to support the back. They also help to keep the lower back flexible. Doing these exercises can help to prevent back pain or lessen existing pain. If you have back pain or discomfort, try doing these exercises 2-3 times each day or as told by your health care provider. When the pain goes away, do them once each day, but increase the number of times that you repeat the steps for each exercise (do more repetitions). If you do not have back pain or discomfort, do these exercises once each day or as told by your health care provider. Exercises Single Knee to Chest Repeat these steps 3-5 times for each leg: 1. Lie on your back on a firm bed or the floor with your legs extended. 2. Bring one knee to your chest. Your other leg should stay extended and in contact with the floor. 3. Hold your knee in place by grabbing your knee or thigh. 4. Pull on your knee until you feel a gentle stretch in your lower back. 5. Hold the stretch for 10-30 seconds. 6. Slowly release and straighten your leg. Pelvic Tilt Repeat these steps 5-10 times: 1. Lie on your back on a firm bed or the floor with your legs extended. 2. Bend your knees so they are pointing toward the ceiling and your feet are flat on the floor. 3. Tighten your lower abdominal muscles to press your lower back against the floor. This motion will tilt your pelvis so your tailbone points up toward the ceiling instead of pointing to your feet or the floor. 4. With gentle tension and even breathing, hold this position for 5-10 seconds. Cat-Cow Repeat these steps until your lower back becomes more flexible: 1. Get into a hands-and-knees position on a firm surface. Keep your hands under your shoulders, and keep your knees under your  hips. You may place padding under your knees for comfort. 2. Let your head hang down, and point your tailbone toward the floor so your lower back becomes rounded like the back of a cat. 3. Hold this position for 5 seconds. 4. Slowly lift your head and point your tailbone up toward the ceiling so your back forms a sagging arch like the back of a cow. 5. Hold this position for 5 seconds.  Press-Ups Repeat these steps 5-10 times: 1. Lie on your abdomen (face-down) on the floor. 2. Place your palms near your head, about shoulder-width apart. 3. While you keep your back as relaxed as possible and keep your hips on the floor, slowly straighten your arms to raise the top half of your body and lift your shoulders. Do not use your back muscles to raise your upper torso. You may adjust the placement of your hands to make yourself more comfortable. 4. Hold this position for 5 seconds while you keep your back relaxed. 5. Slowly return to lying flat on the floor.  Bridges Repeat these steps 10 times: 1. Lie on your back on a firm surface. 2. Bend your knees so they are pointing toward the ceiling and your feet are flat on the floor. 3. Tighten your buttocks muscles and lift your buttocks off of the floor until your waist is at almost the same height as your knees. You should feel the muscles working  in your buttocks and the back of your thighs. If you do not feel these muscles, slide your feet 1-2 inches farther away from your buttocks. 4. Hold this position for 3-5 seconds. 5. Slowly lower your hips to the starting position, and allow your buttocks muscles to relax completely. If this exercise is too easy, try doing it with your arms crossed over your chest. Abdominal Crunches Repeat these steps 5-10 times: 1. Lie on your back on a firm bed or the floor with your legs extended. 2. Bend your knees so they are pointing toward the ceiling and your feet are flat on the floor. 3. Cross your arms over your  chest. 4. Tip your chin slightly toward your chest without bending your neck. 5. Tighten your abdominal muscles and slowly raise your trunk (torso) high enough to lift your shoulder blades a tiny bit off of the floor. Avoid raising your torso higher than that, because it can put too much stress on your low back and it does not help to strengthen your abdominal muscles. 6. Slowly return to your starting position. Back Lifts Repeat these steps 5-10 times: 1. Lie on your abdomen (face-down) with your arms at your sides, and rest your forehead on the floor. 2. Tighten the muscles in your legs and your buttocks. 3. Slowly lift your chest off of the floor while you keep your hips pressed to the floor. Keep the back of your head in line with the curve in your back. Your eyes should be looking at the floor. 4. Hold this position for 3-5 seconds. 5. Slowly return to your starting position. Contact a health care provider if:  Your back pain or discomfort gets much worse when you do an exercise.  Your back pain or discomfort does not lessen within 2 hours after you exercise. If you have any of these problems, stop doing these exercises right away. Do not do them again unless your health care provider says that you can. Get help right away if:  You develop sudden, severe back pain. If this happens, stop doing the exercises right away. Do not do them again unless your health care provider says that you can. This information is not intended to replace advice given to you by your health care provider. Make sure you discuss any questions you have with your health care provider. Document Released: 12/20/2004 Document Revised: 03/18/2018 Document Reviewed: 01/06/2015 Elsevier Interactive Patient Education  2019 [place] Injury Prevention Back injuries can be very painful. They can also be difficult to heal. After having one back injury, you are more likely to have another one again. It is  important to learn how to avoid injuring or re-injuring your back. The following tips can help you to prevent a back injury. What actions can I take to prevent back injuries? Nutrition changes Talk with your health care provider about your overall diet, and especially about foods that strengthen your bones.  Ask your health care provider how much calcium and vitamin D you need each day. These nutrients help to prevent weakening of the bones (osteoporosis). Osteoporosis can cause broken (fractured) bones, which lead to back pain.  Eat foods that are good sources of calcium. These include dairy products, green leafy vegetables, and products that have had calcium added to them (fortified).  Eat foods that are good sources of vitamin D. These include milk and foods that are fortified with vitamin D.  If needed, take supplements and vitamins as directed  by your health care provider. Physical fitness Physical fitness strengthens your bones and your muscles. It also increases your balance and strength.  Exercise for 30 minutes per day on most days of the week, or as directed by your health care provider. Make sure to: ? Do aerobic exercises, such as walking, jogging, biking, or swimming. ? Do exercises that increase balance and strength, such as tai chi and yoga. These can decrease your risk of falling and injuring your back. ? Do stretching exercises to help with flexibility. ? Develop strong abdominal muscles. Your abdominal muscles provide a lot of the support that your back needs.  Maintain a healthy weight. This helps to decrease your risk of a back injury. Good posture        Prevent back injuries by developing and maintaining a good posture. To do this successfully:  Sit up and stand up straight. Avoid leaning forward when you sit or hunching over when you stand.  Choose chairs that have good low-back (lumbar) support.  If you work at a desk, sit close to it so you do not need to  lean over. Keep your chin tucked in. Keep your neck drawn back, and keep your elbows bent at a right angle.  Sit high and close to the steering wheel when you drive. Add a lumbar support to your car seat, if needed.  Avoid sitting or standing in one position for very long. Take breaks to get up, stretch, and walk around at least one time every hour. Take breaks every hour if you are driving for long periods of time.  Sleep on your side with your knees slightly bent, or sleep on your back with a pillow under your knees.  Lifting, twisting, and reaching Back injuries are more likely to occur when carrying loads and twisting at the same time. When you bend and lift, or reach for items that are high up in shelves, use positions that put less stress on your back.  Heavy lifting ? Avoid heavy lifting, especially the kind of heavy lifting that is repetitive. If you must do heavy lifting: ? Stretch before lifting. ? Work slowly. ? Rest between lifts. ? Use a tool such as a cart or a dolly to move objects. ? Make several small trips instead of carrying one heavy load. ? Ask for help when you need it, especially when moving big or heavy objects. ? Follow these steps when lifting: ? Stand with your feet shoulder-width apart. ? Get as close to the object as you can. Do not try to pick up a heavy object that is far from your body. ? Use handles or lifting straps if they are available. ? Bend at your knees. Squat down, but keep your heels off the floor. ? Keep your shoulders pulled back, your chin tucked in, and your back straight. ? Lift the object slowly while you tighten the muscles in your legs, abdomen, and buttocks. Keep the object as close to the center of your body as possible. ? Follow these steps when putting down a heavy load: ? Stand with your feet shoulder-width apart. ? Lower the object slowly while you tighten the muscles in your legs, abdomen, and buttocks. Keep the object as close to  the center of your body as possible. ? Keep your shoulders pulled back, your chin tucked in, and your back straight. ? Bend at your knees. Squat down, but keep your heels off the floor. ? Use handles or lifting straps if  they are available.  Twisting and reaching ? Avoid lifting heavy objects above your waist. ? Do not twist at your waist while you are lifting or carrying a load. If you need to turn, move your feet. ? Do not bend over without bending at your knees. ? Avoid reaching over your head, across a table, or for an object on a high surface.  Other changes   Avoid wet floors and icy ground. Keep sidewalks clear of ice to prevent falls.  Do not sleep on a mattress that is too soft or too hard.  Put heavier objects on shelves at waist level, and put lighter objects on lower or higher shelves.  Find ways to decrease your stress, such as by exercising, getting a massage, or practicing relaxation techniques. Stress can build up in your muscles. Tense muscles are more vulnerable to injury.  Talk with your health care provider if you feel anxious or depressed. These conditions can make back pain worse.  Wear flat heel shoes with cushioned soles.  Use both shoulder straps when carrying a backpack.  Do not use any products that contain nicotine or tobacco, such as cigarettes and e-cigarettes. If you need help quitting, ask your health care provider. Summary  Back injuries can be very painful and difficult to heal.  You can prevent injuring or re-injuring your back by making nutrition changes, working on being physically fit, developing a good posture, and lifting heavy objects in a safe way.  Making other changes can also help to prevent back injuries. These include eating a healthy diet, exercising regularly and maintaining a healthy weight. This information is not intended to replace advice given to you by your health care provider. Make sure you discuss any questions you have  with your health care provider. Document Released: 12/20/2004 Document Revised: 12/28/2017 Document Reviewed: 12/28/2017 Elsevier Interactive Patient Education  2019 [employer].

## 2019-03-13 NOTE — Progress Notes (Signed)
VIRTUAL VISIT Due to national recommendations of social distancing due to [place] 19, a virtual visit is felt to be most appropriate for this patient at this time.   I connected with the patient on 03/13/19 at  4:30 PM EDT by virtual telehealth platform and verified that I am speaking with the correct person using two identifiers.   I discussed the limitations, risks, security and privacy concerns of performing an evaluation and management service by  virtual telehealth platform and the availability of in person appointments. I also discussed with the patient that there may be a patient responsible charge related to this service. The patient expressed understanding and agreed to proceed.  Patient location: Home Provider Location: [facility] Participants: Tracy Dixon and Tracy Dixon   Chief Complaint  Patient presents with  . Leg Pain    Left-gets some relief with Advil    History of Present Illness: 78 year old female pt with history of lumbar spinal stenosis S/P spinal fusion 2014 by Dr. Rolena Infante presents with new onset left leg pain.  She reports new onset pain  3-4 days ago in lef upper thigh, around knee. Pain occ 1-0/10.Marland Kitchen causing some significant issues walking.  Not keeping her up at night. Better with leaning over and lying down. Worse with leaning back. She always has low back pain, not   Leg pain feels betterr if leaning over.  no numbness, no weakness in that leg.   No known injury, no new fall.    Has tried 800 mg ibuprofen ... helped some with pain.   No fever, no new incontinence, no perineal numbness.  No knee pain, no knee swelling. Social History /Family History/Past Medical History reviewed in detail and updated in EMR if needed.  COVID 19 screen No recent travel or known exposure to COVID19 The patient denies respiratory symptoms of COVID 19 at this time.  The importance of social distancing was discussed today.   Review of Systems   Constitutional: Negative for chills and fever.  HENT: Negative for congestion and ear pain.   Eyes: Negative for pain and redness.  Respiratory: Negative for cough and shortness of breath.   Cardiovascular: Negative for chest pain, palpitations and leg swelling.  Gastrointestinal: Negative for abdominal pain, blood in stool, constipation, diarrhea, nausea and vomiting.  Genitourinary: Negative for dysuria.  Musculoskeletal: Positive for back pain. Negative for falls and myalgias.  Skin: Negative for rash.  Neurological: Negative for dizziness.  Psychiatric/Behavioral: Negative for depression. The patient is not nervous/anxious.       Past Medical History:  Diagnosis Date  . 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 Allergy   . Arthritis   . Asthma   . Complication of anesthesia    stays sleepy  . Diverticulosis of colon   . Gallstones   . Hyperlipidemia   . Hypertension   . Osteoarthritis   . Symptomatic mammary hypertrophy     reports that she quit smoking about 45 years ago. Her smoking use included cigarettes. She quit after 15.00 years of use. She has never used smokeless tobacco. She reports that she does not drink alcohol or use drugs.   Current Outpatient Medications:  .  albuterol (PROVENTIL HFA;VENTOLIN HFA) 108 (90 BASE) MCG/ACT inhaler, Inhale 1-2 puffs into the lungs every 6 (six) hours as needed for shortness of breath (for use when in contact with dogs or cats.)., Disp: 3 Inhaler, Rfl: 0 .  amLODipine (NORVASC) 10 MG tablet, TAKE 1 TABLET BY MOUTH  DAILY, Disp: 90 tablet, Rfl: 1 .  atorvastatin (LIPITOR) 20 MG tablet, Take 1 tablet (20 mg total) by mouth daily., Disp: 90 tablet, Rfl: 2 .  cetirizine (ZYRTEC) 10 MG tablet, Take 10 mg by mouth daily as needed for allergies. Reported on 01/10/2016, Disp: , Rfl:  .  cholecalciferol (VITAMIN D) 1000 UNITS tablet, Take 1,000 Units by mouth daily., Disp: , Rfl:  .  hydrochlorothiazide  (HYDRODIURIL) 25 MG tablet, TAKE 1 TABLET BY MOUTH  DAILY, Disp: 90 tablet, Rfl: 1   Observations/Objective: Temperature 97.9 F (36.6 C), temperature source Oral, height 5\' 1"  (1.549 m).  Physical Exam Neurological:     Mental Status: Mental status is at baseline.     Physical Exam Constitutional:      General: She is not in acute distress. Pulmonary:     Effort: Pulmonary effort is normal. No respiratory distress.  Neurological:     Mental Status: She is alert and oriented to person, place, and time.  Psychiatric:        Mood and Affect: Mood normal.        Behavior: Behavior normal.  NO appreciable swelling  In legs  Via camera  Assessment and Plan Sciatica of left side associated with disorder of lumbar spine No red flags or clear indication for emergent exam or X-ray.  Start with prednisone taper, heat, gentle stretches and massage.  If not improving in 2 weeks call for follow up in office.  I discussed the assessment and treatment plan with the patient. The patient was provided an opportunity to ask questions and all were answered. The patient agreed with the plan and demonstrated an understanding of the instructions.   The patient was advised to call back or seek an in-person evaluation if the symptoms worsen or if the condition fails to improve as anticipated.     Tracy Lofts, MD

## 2019-03-13 NOTE — Assessment & Plan Note (Signed)
No red flags or clear indication for emergent exam or X-ray.  Start with prednisone taper, heat, gentle stretches and massage.  If not improving in 2 weeks call for follow up in office.

## 2019-03-24 ENCOUNTER — Other Ambulatory Visit: Payer: Self-pay | Admitting: Family Medicine

## 2019-03-24 MED ORDER — PREDNISONE 20 MG PO TABS: ORAL_TABLET | ORAL | 0 refills | Status: DC

## 2019-04-01 ENCOUNTER — Encounter: Payer: Self-pay | Admitting: Podiatry

## 2019-04-01 ENCOUNTER — Ambulatory Visit (INDEPENDENT_AMBULATORY_CARE_PROVIDER_SITE_OTHER): Payer: Medicare Other | Admitting: Podiatry

## 2019-04-01 ENCOUNTER — Other Ambulatory Visit: Payer: Self-pay

## 2019-04-01 VITALS — Temp 98.1°F

## 2019-04-01 DIAGNOSIS — M79675 Pain in left toe(s): Secondary | ICD-10-CM | POA: Diagnosis not present

## 2019-04-01 DIAGNOSIS — B351 Tinea unguium: Secondary | ICD-10-CM | POA: Diagnosis not present

## 2019-04-01 DIAGNOSIS — M79674 Pain in right toe(s): Secondary | ICD-10-CM

## 2019-04-01 DIAGNOSIS — L84 Corns and callosities: Secondary | ICD-10-CM | POA: Diagnosis not present

## 2019-04-01 NOTE — Patient Instructions (Signed)

## 2019-04-01 NOTE — Progress Notes (Signed)
Subjective: Tracy Dixon is a 78 y.o. y.o. female who presents today for follow up treatment of elongated, thick toenails and painful callus plantar aspect right foot which both interfere with daily activities. Pain is aggravated when wearing enclosed shoe gear and relieved with periodic professional debridement.  Tracy Sanders, MD is her PCP.    Current Outpatient Medications:  .  albuterol (PROVENTIL HFA;VENTOLIN HFA) 108 (90 BASE) MCG/ACT inhaler, Inhale 1-2 puffs into the lungs every 6 (six) hours as needed for shortness of breath (for use when in contact with dogs or cats.)., Disp: 3 Inhaler, Rfl: 0 .  amLODipine (NORVASC) 10 MG tablet, TAKE 1 TABLET BY MOUTH  DAILY, Disp: 90 tablet, Rfl: 1 .  atorvastatin (LIPITOR) 20 MG tablet, Take 1 tablet (20 mg total) by mouth daily., Disp: 90 tablet, Rfl: 2 .  cetirizine (ZYRTEC) 10 MG tablet, Take 10 mg by mouth daily as needed for allergies. Reported on 01/10/2016, Disp: , Rfl:  .  cholecalciferol (VITAMIN D) 1000 UNITS tablet, Take 1,000 Units by mouth daily., Disp: , Rfl:  .  hydrochlorothiazide (HYDRODIURIL) 25 MG tablet, TAKE 1 TABLET BY MOUTH  DAILY, Disp: 90 tablet, Rfl: 1 .  predniSONE (DELTASONE) 20 MG tablet, 2 tabs x 5 days, 1 tab x 5 days then 1/2 tabs x 5 days, then dtop, Disp: 18 tablet, Rfl: 0  Allergies  Allergen Reactions  . Contrast Media [Iodinated Diagnostic Agents] Hives  . Crab [Shellfish Allergy] Hives    Objective:  Vascular Examination: Capillary refill time immediate x 10 digits.  Dorsalis pedis pulses palpable b/l.  Posterior tibial pulses palpable b/l.  Digital hair sparse x 10 digits.  Skin temperature gradient WNL b/l.  +1 Edema left ankle with notable varicosities.  No calf tenderness/warmth.  Dermatological Examination: Skin with normal turgor, texture and tone b/l.  Toenails 1-5 b/l discolored, thick, dystrophic with subungual debris and pain with palpation to nailbeds due to thickness of  nails.  Hyperkeratotic lesion lateral aspect right 3rd digit. No erythema, no edema, no drainage, no flocculence noted.  Porokeratotic lesions submet head 2nd right foot with tenderness to palpation. No erythema, no edema, no drainage, no flocculence.   Musculoskeletal: Muscle strength 5/5 to all LE muscle groups.  Hammertoe deformity 2-5 b/l  Neurological: Sensation intact with 10 gram monofilament.  Vibratory sensation intact b/l.  Assessment: 1.  Painful onychomycosis toenails 1-5 b/l 2.  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 3.  Corn lateral aspect 3rd digit.   Plan: 1. Toenails 1-5 b/l were debrided in length and girth without iatrogenic bleeding. 2. Hyperkeratotic lesion(s)  Pared right 3rd digit with sterile scalpel blade without incident. 3. 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  4. Patient to continue soft, supportive shoe gear daily. 5. Patient to report any pedal injuries to medical professional immediately. 6. Follow up 3 months.  7. Patient/POA to call should there be a concern in the interim.

## 2019-04-17 ENCOUNTER — Other Ambulatory Visit: Payer: Self-pay

## 2019-04-17 ENCOUNTER — Encounter: Payer: Self-pay | Admitting: Plastic Surgery

## 2019-04-17 ENCOUNTER — Ambulatory Visit (INDEPENDENT_AMBULATORY_CARE_PROVIDER_SITE_OTHER): Payer: Medicare Other | Admitting: Plastic Surgery

## 2019-04-17 VITALS — BP 137/83 | HR 99 | Temp 98.4°F | Ht 61.0 in | Wt 170.8 lb

## 2019-04-17 DIAGNOSIS — Z9889 Other specified postprocedural states: Secondary | ICD-10-CM

## 2019-04-17 DIAGNOSIS — N641 Fat necrosis of breast: Secondary | ICD-10-CM | POA: Diagnosis not present

## 2019-04-17 NOTE — Progress Notes (Signed)
   Subjective:    Patient ID: Tracy Dixon, female    DOB: 1941/09/30, 78 y.o.   MRN: [id number]  The patient is a 78 year old female here for follow-up on her bilateral breast reduction.  She had a mammogram this past year and it was negative.  She notices some firmness in the left breast at the 1 o'clock position it has not changed much.  She does not feel like it is getting bigger.  She says that it is not painful, it does not bother her and she is not worried about it.  She has good symmetry.  She is very pleased with her results.  There is no sign of infection.  She has some left leg swelling.  She states that this is usual for her.  She wears compression socks which helps a lot.  She does have those on today as well.   Review of Systems  Constitutional: Negative for activity change and appetite change.  HENT: Negative.   Respiratory: Negative for chest tightness and shortness of breath.   Cardiovascular: Negative.   Gastrointestinal: Negative.   Genitourinary: Negative.   Musculoskeletal: Negative.   Hematological: Negative.   Psychiatric/Behavioral: Negative.        Objective:   Physical Exam Vitals signs and nursing note reviewed.  Constitutional:      Appearance: Normal appearance.  HENT:     Head: Normocephalic.  Cardiovascular:     Rate and Rhythm: Normal rate.     Pulses: Normal pulses.  Pulmonary:     Effort: Pulmonary effort is normal.  Abdominal:     General: Abdomen is flat. There is no distension.  Skin:    General: Skin is warm.  Neurological:     General: No focal deficit present.     Mental Status: She is alert and oriented to person, place, and time.        Assessment & Plan:  History of bilateral breast reduction surgery  Fat necrosis of breast  Continue to massage the left breast.  Continue yearly mammograms. We discussed options for excision.  She would like to wait and continue to monitor it.  It does not bother her and she is not worried  about it. Pictures obtained with the patient permission and placed in the chart.

## 2019-04-20 ENCOUNTER — Encounter: Payer: Self-pay | Admitting: Emergency Medicine

## 2019-04-20 ENCOUNTER — Emergency Department
Admission: EM | Admit: 2019-04-20 | Discharge: 2019-04-20 | Disposition: A | Discharge status: Home / Self Care | Payer: Medicare Other | Attending: Emergency Medicine | Admitting: Emergency Medicine

## 2019-04-20 ENCOUNTER — Other Ambulatory Visit: Payer: Self-pay

## 2019-04-20 DIAGNOSIS — J45909 Unspecified asthma, uncomplicated: Secondary | ICD-10-CM | POA: Insufficient documentation

## 2019-04-20 DIAGNOSIS — I10 Essential (primary) hypertension: Secondary | ICD-10-CM | POA: Insufficient documentation

## 2019-04-20 DIAGNOSIS — Z79899 Other long term (current) drug therapy: Secondary | ICD-10-CM | POA: Insufficient documentation

## 2019-04-20 DIAGNOSIS — E876 Hypokalemia: Secondary | ICD-10-CM | POA: Insufficient documentation

## 2019-04-20 DIAGNOSIS — Z87891 Personal history of nicotine dependence: Secondary | ICD-10-CM | POA: Diagnosis not present

## 2019-04-20 DIAGNOSIS — R002 Palpitations: Secondary | ICD-10-CM | POA: Diagnosis not present

## 2019-04-20 LAB — BASIC METABOLIC PANEL
Anion gap: 11 (ref 5–15)
BUN: 13 mg/dL (ref 8–23)
CO2: 28 mmol/L (ref 22–32)
Calcium: 9.1 mg/dL (ref 8.9–10.3)
Chloride: 100 mmol/L (ref 98–111)
Creatinine, Ser: 0.77 mg/dL (ref 0.44–1.00)
GFR calc Af Amer: >60 mL/min (ref >60)
GFR calc non Af Amer: >60 mL/min (ref >60)
Glucose, Bld: 114 mg/dL — ABNORMAL HIGH (ref 70–99)
Potassium: 2.8 mmol/L — ABNORMAL LOW (ref 3.5–5.1)
Sodium: 139 mmol/L (ref 135–145)

## 2019-04-20 LAB — CBC
HCT: 42.6 % (ref 36.0–46.0)
Hemoglobin: 13.6 g/dL (ref 12.0–15.0)
MCH: 25 pg — ABNORMAL LOW (ref 26.0–34.0)
MCHC: 31.9 g/dL (ref 30.0–36.0)
MCV: 78.5 fL — ABNORMAL LOW (ref 80.0–100.0)
Platelets: 231 10*3/uL (ref 150–400)
RBC: 5.43 MIL/uL — ABNORMAL HIGH (ref 3.87–5.11)
RDW: 17.5 % — ABNORMAL HIGH (ref 11.5–15.5)
WBC: 7.2 10*3/uL (ref 4.0–10.5)
nRBC: 0 % (ref 0.0–0.2)

## 2019-04-20 LAB — TSH: TSH: 1.91 u[IU]/mL (ref 0.350–4.500)

## 2019-04-20 LAB — MAGNESIUM: Magnesium: 2.1 mg/dL (ref 1.7–2.4)

## 2019-04-20 MED ORDER — HYDROCHLOROTHIAZIDE 12.5 MG PO CAPS: 12.5000 mg | ORAL_CAPSULE | Freq: Every day | ORAL | 0 refills | Status: DC

## 2019-04-20 MED ORDER — POTASSIUM CHLORIDE 10 MEQ/100ML IV SOLN
10.0000 meq | Freq: Once | INTRAVENOUS | Status: AC
  Administered 2019-04-20: 10 meq via INTRAVENOUS
  Filled 2019-04-20: qty 100

## 2019-04-20 MED ORDER — POTASSIUM CHLORIDE CRYS ER 20 MEQ PO TBCR
40.0000 meq | EXTENDED_RELEASE_TABLET | Freq: Once | ORAL | Status: AC
  Administered 2019-04-20: 12:00:00 40 meq via ORAL
  Filled 2019-04-20: qty 2

## 2019-04-20 NOTE — ED Notes (Signed)
Pt assisted to bathroom

## 2019-04-20 NOTE — ED Provider Notes (Signed)
[facility] Emergency Department Provider Note   ____________________________________________   First MD Initiated Contact with Patient 04/20/19 1057     (approximate)  I have reviewed the triage vital signs and the nursing notes.   HISTORY  Chief Complaint Irregular Heart Beat    HPI Tracy Dixon is a 78 y.o. female reports that over the weekend she felt like her heart was going fast, at times it was running from about 100-130.  She made an appointment for herself  last night in urgent care.  She reports after she got up today she has not had any more racing heart feeling.  Her symptom went away, but she went to urgent care because she made the appointment and they told her that is probably best to have her electrolytes checked and she reports they referred her here.  Evidently at the urgent care her EKG had a prolonged QT (?)  Patient denies any leg swelling, abdominal pain, chest pain, trouble breathing, cough cold or other signs of infection.  Patient reports only symptom she notes with her heart just felt like it was going slightly fast.  No history of blood clots.  Does not take any estrogen or hormones.  No leg swelling.  No recent trauma or surgery.  No chest pain at all.  Does not smoke.  Denies personal history of any heart disease.  Is a history of high blood pressure.  Past Medical History:  Diagnosis Date  . Acoustic neuroma [facility])    followed by [facility], dx in [place]  . Allergy   . Arthritis   . Asthma   . Complication of anesthesia    stays sleepy  . Diverticulosis of colon   . Gallstones   . Hyperlipidemia   . Hypertension   . Osteoarthritis   . Symptomatic mammary hypertrophy     Patient Active Problem List   Diagnosis Date Noted  . Fat necrosis of breast 04/17/2019  . History of bilateral breast reduction surgery 08/27/2018  . Chronic venous insufficiency 05/12/2018  . Lymphedema 05/12/2018  . 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 Metabolic syndrome 48/18/5631  . Counseling regarding end of life decision making 09/30/2014  . Spinal stenosis of lumbar region 03/17/2013  . Osteopenia 05/23/2009  . Sciatica of left side associated with disorder of lumbar spine 09/25/2007  . High cholesterol 09/22/2007  . OBESITY 09/22/2007  . Essential hypertension, benign 09/22/2007  . VARICOSE VEINS, LOWER EXTREMITIES 09/22/2007  . ALLERGIC RHINITIS 09/22/2007  . Mild intermittent asthma 09/22/2007  . DIVERTICULOSIS, COLON 09/22/2007  . DJD (degenerative joint disease) 09/22/2007    Past Surgical History:  Procedure Laterality Date  . Bilateral tubal ligation  2005  . BREAST REDUCTION SURGERY Bilateral 06/04/2018   Procedure: MAMMARY REDUCTION  (BREAST);  Surgeon: Wallace Going, DO;  Location: [facility];  Service: Plastics;  Laterality: Bilateral;  . Carotid doppler  07/2006   Neg  . CESAREAN SECTION  2005  . CHOLECYSTECTOMY  1982  . DECOMPRESSIVE LUMBAR LAMINECTOMY LEVEL 3  06/10/2013   Procedure: DECOMPRESSIVE LUMBAR LAMINECTOMY LEVEL 3;  Surgeon: Melina Schools, MD;  Location: [place];  Service: Orthopedics;;  lumbar three to five decompression  . MRI brain  2007   acoustic neuroma (check yearly with MRI)  . PFT's  12/2006   (-) FEV1/FVC 79%, FEV1 84%  . REDUCTION MAMMAPLASTY Bilateral 05/2018  . REPLACEMENT TOTAL KNEE BILATERAL  2005  . TONSILLECTOMY  2005  .  TUBAL LIGATION      Prior to Admission medications   Medication Sig Start Date End Date Taking? Authorizing Provider  albuterol (PROVENTIL HFA;VENTOLIN HFA) 108 (90 BASE) MCG/ACT inhaler Inhale 1-2 puffs into the lungs every 6 (six) hours as needed for shortness of breath (for use when in contact with dogs or cats.). 10/13/15  Yes Bedsole, Amy E, MD  amLODipine (NORVASC) 10 MG tablet TAKE 1 TABLET BY MOUTH  DAILY Patient taking differently: Take 10 mg by mouth daily.  12/29/18  Yes Bedsole, Amy E, MD  atorvastatin (LIPITOR) 20  MG tablet Take 1 tablet (20 mg total) by mouth daily. 03/13/19  Yes Bedsole, Amy E, MD  cetirizine (ZYRTEC) 10 MG tablet Take 10 mg by mouth daily as needed for allergies. Reported on 01/10/2016   Yes [provider]  cholecalciferol (VITAMIN D) 1000 UNITS tablet Take 1,000 Units by mouth daily.   Yes [provider]  omeprazole (PRILOSEC OTC) 20 MG tablet Take 20 mg by mouth daily as needed (heartburn or indigestion).   Yes [provider]  hydrochlorothiazide (MICROZIDE) 12.5 MG capsule Take 1 capsule (12.5 mg total) by mouth daily. 04/20/19 04/19/20  Delman Kitten, MD    Allergies Contrast media [iodinated diagnostic agents] and Otho Darner allergy]  Family History  Problem Relation Age of Onset  . Cancer Mother        Lung CA  . Stroke Mother        CVA  . Cancer Father        Prostate  . Heart disease Neg Hx   . Breast cancer Neg Hx     Social History Social History   Tobacco Use  . Smoking status: Former Smoker    Years: 15.00    Types: Cigarettes    Last attempt to quit: 06/02/1973    Years since quitting: 45.9  . Smokeless tobacco: Never Used  . Tobacco comment: few cigs/day  Substance Use Topics  . Alcohol use: No  . Drug use: No    Review of Systems Constitutional: No fever/chills Eyes: No visual changes. ENT: No sore throat. Cardiovascular: Denies chest pain.  Just felt subtle feeling like her heart was running a little too fast the last 2 days. Respiratory: Denies shortness of breath. Gastrointestinal: No abdominal pain.   Genitourinary: Negative for dysuria. Musculoskeletal: Negative for back pain. Skin: Negative for rash. Neurological: Negative for headaches, areas of focal weakness or numbness.    ____________________________________________   PHYSICAL EXAM:  VITAL SIGNS: ED Triage Vitals  Enc Vitals Group     BP 04/20/19 1040 (!) 155/74     Pulse Rate 04/20/19 1040 (!) 105     Resp 04/20/19 1100 20     Temp 04/20/19  1040 97.8 F (36.6 C)     Temp Source 04/20/19 1040 Oral     SpO2 04/20/19 1040 98 %     Weight 04/20/19 1036 169 lb (76.7 kg)     Height 04/20/19 1036 5\' 1"  (1.549 m)     Head Circumference --      Peak Flow --      Pain Score 04/20/19 1036 0     Pain Loc --      Pain Edu? --      Excl. in [place]? --     Constitutional: Alert and oriented. Well appearing and in no acute distress.  Extremely pleasant Eyes: Conjunctivae are normal. Head: Atraumatic. Nose: No congestion/rhinnorhea. Mouth/Throat: Mucous membranes are moist. Neck: No  stridor.  Cardiovascular: Normal rate, regular rhythm. Grossly normal heart sounds.  Good peripheral circulation.  Normal cardiovascular exam. Respiratory: Normal respiratory effort.  No retractions. Lungs CTAB.  Speaks in full and very clear sentences. Gastrointestinal: Soft and nontender. No distention. Musculoskeletal: No lower extremity tenderness nor edema.  No calf or thigh tenderness. Neurologic:  Normal speech and language. No gross focal neurologic deficits are appreciated.  Skin:  Skin is warm, dry and intact. No rash noted. Psychiatric: Mood and affect are normal. Speech and behavior are normal.  ____________________________________________   LABS (all labs ordered are listed, but only abnormal results are displayed)  Labs Reviewed  CBC - Abnormal; Notable for the following components:      Result Value   RBC 5.43 (*)    MCV 78.5 (*)    MCH 25.0 (*)    RDW 17.5 (*)    All other components within normal limits  BASIC METABOLIC PANEL - Abnormal; Notable for the following components:   Potassium 2.8 (*)    Glucose, Bld 114 (*)    All other components within normal limits  MAGNESIUM  TSH   ____________________________________________  EKG  Reviewed entered by me at 1050 Heart rate 95 QRS 90 QTc 460 Normal sinus rhythm, no evidence of acute ischemia or ectopy. ____________________________________________  RADIOLOGY  No results  found.   ____________________________________________   PROCEDURES  Procedure(s) performed: None  Procedures  Critical Care performed: No  ____________________________________________   INITIAL IMPRESSION / ASSESSMENT AND PLAN / ED COURSE  Pertinent labs & imaging results that were available during my care of the patient were reviewed by me and considered in my medical decision making (see chart for details).   Patient presents for palpitations that she noticed the last couple days with elevated rate without other associated symptoms.  Seem to get better today, advised to come to have her electrolytes checked.  She is well-appearing and reports her symptoms actually quite better, but given her use of hydrochlorothiazide and other blood pressure medications we will check her labs.  EKG reassuring.  Vital signs normal.  No chest pain no trouble breathing.  Clinical Course as of Apr 20 1443  Mon Apr 20, 2019  1233 Discussed with the patient and also her daughter who is a physician over the phone.  Agreeable with plan to replete potassium, will decrease her hydrochlorothiazide dose to 12.5 mg and follow-up with her doctor for repeat potassium test in about a week's time.  Patient remains asymptomatic, resting comfortably very pleasant.   [MQ]    Clinical Course User Index [MQ] Delman Kitten, MD   Hypokalemia, repleted with IV and oral.  Patient reports she feels good.  She is comfortable with plan to decrease her hydrochlorothiazide dose to 12-1/2 mg and follow-up closely with her doctor with recommendation to have a repeat potassium done in about a week.  Return precautions and treatment recommendations and follow-up discussed with the patient who is agreeable with the plan.   ____________________________________________   FINAL CLINICAL IMPRESSION(S) / ED DIAGNOSES  Final diagnoses:  Hypokalemia        Note:  This document was prepared using Dragon voice recognition  software and may include unintentional dictation errors       Delman Kitten, MD 04/20/19 1540

## 2019-04-20 NOTE — ED Notes (Signed)
EKG was done by triage staff at 1048 AM

## 2019-04-20 NOTE — ED Triage Notes (Signed)
Pt sent by [facility] for intermittent tachycardia this weekend. David with [facility] also reports pt with long QT and wide QRS.  Pt denies all symptoms states went to [facility] because she felt like her heart was irregular.

## 2019-04-20 NOTE — ED Notes (Signed)
Updated pt and daughter. NAD. No needs at this time.

## 2019-04-23 DIAGNOSIS — H2511 Age-related nuclear cataract, right eye: Secondary | ICD-10-CM | POA: Diagnosis not present

## 2019-05-01 ENCOUNTER — Telehealth: Payer: Self-pay | Admitting: Family Medicine

## 2019-05-01 DIAGNOSIS — E876 Hypokalemia: Secondary | ICD-10-CM

## 2019-05-01 NOTE — Telephone Encounter (Signed)
-----   Message from Cloyd Stagers, RT sent at 04/29/2019  1:43 PM EDT ----- Regarding: Lab Orders for Tuesday 6.9.2020 Please place lab orders for Tuesday 6.9.2020, appt notes: BMP per PCP, related to ED visit on 5/25 Thank you, Dyke Maes RT(R)

## 2019-05-05 ENCOUNTER — Other Ambulatory Visit (INDEPENDENT_AMBULATORY_CARE_PROVIDER_SITE_OTHER): Payer: Medicare Other

## 2019-05-05 DIAGNOSIS — E876 Hypokalemia: Secondary | ICD-10-CM

## 2019-05-05 LAB — BASIC METABOLIC PANEL
BUN: 11 mg/dL (ref 6–23)
CO2: 31 mEq/L (ref 19–32)
Calcium: 8.8 mg/dL (ref 8.4–10.5)
Chloride: 102 mEq/L (ref 96–112)
Creatinine, Ser: 0.74 mg/dL (ref 0.40–1.20)
GFR: 91.8 mL/min (ref >60.00)
Glucose, Bld: 81 mg/dL (ref 70–99)
Potassium: 3.4 mEq/L — ABNORMAL LOW (ref 3.5–5.1)
Sodium: 139 mEq/L (ref 135–145)

## 2019-06-02 ENCOUNTER — Other Ambulatory Visit: Payer: Self-pay | Admitting: Family Medicine

## 2019-06-02 MED ORDER — HYDROCHLOROTHIAZIDE 12.5 MG PO CAPS: 12.5000 mg | ORAL_CAPSULE | Freq: Every day | ORAL | 3 refills | Status: DC

## 2019-06-16 ENCOUNTER — Other Ambulatory Visit: Payer: Self-pay | Admitting: Family Medicine

## 2019-07-01 ENCOUNTER — Other Ambulatory Visit: Payer: Self-pay

## 2019-07-01 ENCOUNTER — Encounter: Payer: Self-pay | Admitting: Podiatry

## 2019-07-01 ENCOUNTER — Ambulatory Visit (INDEPENDENT_AMBULATORY_CARE_PROVIDER_SITE_OTHER): Payer: Medicare Other | Admitting: Podiatry

## 2019-07-01 DIAGNOSIS — B351 Tinea unguium: Secondary | ICD-10-CM | POA: Diagnosis not present

## 2019-07-01 DIAGNOSIS — L84 Corns and callosities: Secondary | ICD-10-CM

## 2019-07-01 DIAGNOSIS — M79675 Pain in left toe(s): Secondary | ICD-10-CM

## 2019-07-01 DIAGNOSIS — M79674 Pain in right toe(s): Secondary | ICD-10-CM | POA: Diagnosis not present

## 2019-07-01 NOTE — Patient Instructions (Signed)

## 2019-07-02 ENCOUNTER — Other Ambulatory Visit: Payer: Self-pay | Admitting: Family Medicine

## 2019-07-05 NOTE — Progress Notes (Signed)
Subjective: Tracy Dixon presents to clinic with cc of painful mycotic toenails and calluses which are aggravated when weightbearing with and without shoe gear.  This pain limits her daily activities. Pain symptoms resolve with periodic professional debridement.  Tracy Sanders, MD is her PCP. Last visit was 03/13/2019.   Current Outpatient Medications:  .  albuterol (PROVENTIL HFA;VENTOLIN HFA) 108 (90 BASE) MCG/ACT inhaler, Inhale 1-2 puffs into the lungs every 6 (six) hours as needed for shortness of breath (for use when in contact with dogs or cats.)., Disp: 3 Inhaler, Rfl: 0 .  amLODipine (NORVASC) 10 MG tablet, TAKE 1 TABLET BY MOUTH  DAILY, Disp: 90 tablet, Rfl: 1 .  amoxicillin (AMOXIL) 500 MG capsule, , Disp: , Rfl:  .  atorvastatin (LIPITOR) 20 MG tablet, Take 1 tablet (20 mg total) by mouth daily., Disp: 90 tablet, Rfl: 2 .  cetirizine (ZYRTEC) 10 MG tablet, Take 10 mg by mouth daily as needed for allergies. Reported on 01/10/2016, Disp: , Rfl:  .  cholecalciferol (VITAMIN D) 1000 UNITS tablet, Take 1,000 Units by mouth daily., Disp: , Rfl:  .  hydrochlorothiazide (MICROZIDE) 12.5 MG capsule, Take 1 capsule (12.5 mg total) by mouth daily., Disp: 90 capsule, Rfl: 3 .  omeprazole (PRILOSEC OTC) 20 MG tablet, Take 20 mg by mouth daily as needed (heartburn or indigestion)., Disp: , Rfl:    Allergies  Allergen Reactions  . Contrast Media [Iodinated Diagnostic Agents] Hives  . Crab [Shellfish Allergy] Hives     Objective: Physical Examination:  Vascular  Examination: Capillary refill time immediate x 10 digits.  Palpable DP/PT pulses b/l.  Digital hair present b/l.  No edema noted b/l.  +Varicosities noted b/l.  Skin temperature gradient WNL b/l.  Dermatological Examination: Skin with normal turgor, texture and tone b/l.  No open wounds b/l.  No interdigital macerations noted b/l.  Elongated, thick, discolored brittle toenails with subungual debris and pain on  dorsal palpation of nailbeds 1-5 b/l.  Hyperkeratotic lesion lateral aspect right 3rd digit with tenderness to palpation. No edema, no erythema, no drainage, no flocculence. Porokeratotic lesions submet head 2nd right foot with tenderness to palpation. No erythema, no edema, no drainage, no flocculence.  Musculoskeletal Examination: Muscle strength 5/5 to all muscle groups b/l.  Hammertoes 2-5 b/l.  No pain, crepitus or joint discomfort with active/passive ROM.  Neurological Examination: Sensation intact 5/5 b/l with 10 gram monofilament.  Vibratory sensation intact b/l.  Proprioceptive sensation intact b/l.  Assessment: 1. Mycotic nail infection with pain 1-5 b/l 2. Porokeratosis submet head 2 right foot 3. Corn right 3rd digit  Plan: 1. Toenails 1-5 b/l were debrided in length and girth without iatrogenic laceration. 2. Corn pared right 3rd digit utilizing sterile scalpel blade without incident.Porokeratosis submet head 2 right foot pared and enucleated with sterile scalpel blade without incident. 3. Continue soft, supportive shoe gear daily. 4. Report any pedal injuries to medical professional. 5. Follow up 10 weeks. 6. Patient/POA to call should there be a question/concern in there interim.

## 2019-07-20 ENCOUNTER — Telehealth: Payer: Self-pay | Admitting: Podiatry

## 2019-07-20 NOTE — Telephone Encounter (Signed)
Pt was seen on 07/01/19 and was told a prescription for anti-fungal cream would be called into her pharmacy but she had not heard anything regarding the prescription. Please give patient a call

## 2019-07-27 ENCOUNTER — Telehealth: Payer: Self-pay | Admitting: *Deleted

## 2019-07-27 MED ORDER — NONFORMULARY OR COMPOUNDED ITEM: 11 refills | Status: DC

## 2019-07-27 NOTE — Telephone Encounter (Signed)
-----   Message from Marzetta Board, DPM sent at 07/23/2019 10:12 AM EDT ----- Hi Val,  Can you ask her if it was for skin or toenails. If toenails, then we will send [facility] antifungal for nails and it will be mailed to her home. She doesn't pick up at her pharmacy.   If for skin, Lotrisone Cream to be applied to both feet bid x 4 weeks.  Thanks,  Dr. Darnell Level.

## 2019-07-27 NOTE — Telephone Encounter (Signed)
Pt states she is needing the topical for the toenails. I informed pt of [place] [phone] and they would contact her with insurance and delivery information. Faxed orders to [facility].

## 2019-07-29 ENCOUNTER — Ambulatory Visit: Payer: Medicare Other

## 2019-07-30 ENCOUNTER — Ambulatory Visit: Payer: Medicare Other

## 2019-07-30 ENCOUNTER — Other Ambulatory Visit: Payer: Self-pay

## 2019-07-30 ENCOUNTER — Ambulatory Visit (INDEPENDENT_AMBULATORY_CARE_PROVIDER_SITE_OTHER): Payer: Medicare Other

## 2019-07-30 DIAGNOSIS — Z23 Encounter for immunization: Secondary | ICD-10-CM | POA: Diagnosis not present

## 2019-09-01 DIAGNOSIS — M5136 Other intervertebral disc degeneration, lumbar region: Secondary | ICD-10-CM | POA: Diagnosis not present

## 2019-09-08 ENCOUNTER — Encounter: Payer: Self-pay | Admitting: Podiatry

## 2019-09-08 ENCOUNTER — Ambulatory Visit (INDEPENDENT_AMBULATORY_CARE_PROVIDER_SITE_OTHER): Payer: Medicare Other | Admitting: Podiatry

## 2019-09-08 ENCOUNTER — Other Ambulatory Visit: Payer: Self-pay

## 2019-09-08 DIAGNOSIS — M79674 Pain in right toe(s): Secondary | ICD-10-CM

## 2019-09-08 DIAGNOSIS — L84 Corns and callosities: Secondary | ICD-10-CM

## 2019-09-08 DIAGNOSIS — M79675 Pain in left toe(s): Secondary | ICD-10-CM | POA: Diagnosis not present

## 2019-09-08 DIAGNOSIS — B351 Tinea unguium: Secondary | ICD-10-CM | POA: Diagnosis not present

## 2019-09-08 NOTE — Patient Instructions (Signed)

## 2019-09-13 NOTE — Progress Notes (Signed)
Subjective: Tracy Dixon presents to clinic with cc of painful mycotic toenails and callus right foot which are aggravated when weightbearing with and without shoe gear.  This pain limits her daily activities. Pain symptoms resolve with periodic professional debridement.  [name], MD is her PCP.   Current Outpatient Medications on File Prior to Visit  Medication Sig Dispense Refill  . albuterol (PROVENTIL HFA;VENTOLIN HFA) 108 (90 BASE) MCG/ACT inhaler Inhale 1-2 puffs into the lungs every 6 (six) hours as needed for shortness of breath (for use when in contact with dogs or cats.). 3 Inhaler 0  . amLODipine (NORVASC) 10 MG tablet TAKE 1 TABLET BY MOUTH  DAILY 90 tablet 1  . amoxicillin (AMOXIL) 500 MG capsule     . atorvastatin (LIPITOR) 20 MG tablet Take 1 tablet (20 mg total) by mouth daily. 90 tablet 2  . cetirizine (ZYRTEC) 10 MG tablet Take 10 mg by mouth daily as needed for allergies. Reported on 01/10/2016    . cholecalciferol (VITAMIN D) 1000 UNITS tablet Take 1,000 Units by mouth daily.    . hydrochlorothiazide (MICROZIDE) 12.5 MG capsule Take 1 capsule (12.5 mg total) by mouth daily. 90 capsule 3  . NONFORMULARY OR COMPOUNDED ITEM [facility]:  Antifungal topical - Terbinafine 3%, Fluconazole 2%, Tea Tree Oil 5%, Ibuprofen 2%, in #71m DMSO Suspension. Apply to affected toenail(s) once at bedtime or twice daily. 30 each 11  . omeprazole (PRILOSEC OTC) 20 MG tablet Take 20 mg by mouth daily as needed (heartburn or indigestion).    . potassium chloride SA (KLOR-CON) 20 MEQ tablet potassium chloride ER 20 mEq tablet,extended release(part/cryst)    . predniSONE (DELTASONE) 20 MG tablet prednisone 20 mg tablet    . Zoster Vaccine Adjuvanted ([facility] injection Shingrix (PF) 50 mcg/0.5 mL intramuscular suspension, kit     No current facility-administered medications on file prior to visit.      Allergies  Allergen Reactions  . Contrast Media [Iodinated Diagnostic  Agents] Hives  . Crab [Shellfish Allergy] Hives     Objective: There were no vitals filed for this visit.  Physical Examination:  Vascular  Examination: Capillary refill time immediate x 10 digits.  Palpable DP/PT pulses b/l.  Digital hair present b/l.  No edema noted b/l.  Skin temperature gradient WNL b/l.   Varicosities present b/l LE.   Dermatological Examination: Skin with normal turgor, texture and tone b/l.  No open wounds b/l.  No interdigital macerations noted b/l.  Elongated, thick, discolored brittle toenails with subungual debris and pain on dorsal palpation of nailbeds 1-5 b/l.  Hyperkeratotic lesion lateral aspect 3rd digit with tenderness to palpation. No edema, no erythema, no drainage, no flocculence.   Porokeratotic lesions submet head 2 right foot with tenderness to palpation. No erythema, no edema, no drainage, no flocculence.   Musculoskeletal Examination: Muscle strength 5/5 to all muscle groups b/l.  Hammertoes 2-5 b/l.  No pain, crepitus or joint discomfort with active/passive ROM.  Neurological Examination: Sensation intact 5/5 b/l with 10 gram monofilament.  Assessment: 1. Mycotic nail infection with pain 1-5 b/l 2. Porokeratosis submet head 2 right foot 3. Corn right 3rd digit  Plan: 1. Toenails 1-5 b/l were debrided in length and girth without iatrogenic laceration. 2. Porokeratosis submet head 2 pared and enucleated with sterile scalpel blade. Iatrogenic laceration sustained during paring. Treated with Lumicain Hemostatic Solution and alcohol. Light dressing applied. Patient instructed to apply antibiotic ointment to right foot once daily for one week.  3. Corn(s) pared right 3rd digit utilizing sterile scalpel blade without incident. 4. Continue soft, supportive shoe gear daily. 5. Report any pedal injuries to medical professional. 6. Follow up 3 months. 7. Patient/POA to call should there be a question/concern in there interim.

## 2019-10-07 DIAGNOSIS — M48062 Spinal stenosis, lumbar region with neurogenic claudication: Secondary | ICD-10-CM | POA: Diagnosis not present

## 2019-10-16 DIAGNOSIS — M545 Low back pain: Secondary | ICD-10-CM | POA: Diagnosis not present

## 2019-10-20 DIAGNOSIS — M545 Low back pain: Secondary | ICD-10-CM | POA: Diagnosis not present

## 2019-10-27 DIAGNOSIS — M545 Low back pain: Secondary | ICD-10-CM | POA: Diagnosis not present

## 2019-10-29 DIAGNOSIS — M545 Low back pain: Secondary | ICD-10-CM | POA: Diagnosis not present

## 2019-11-05 DIAGNOSIS — M545 Low back pain: Secondary | ICD-10-CM | POA: Diagnosis not present

## 2019-11-24 ENCOUNTER — Other Ambulatory Visit: Payer: Self-pay | Admitting: Family Medicine

## 2019-11-30 ENCOUNTER — Other Ambulatory Visit: Payer: Self-pay | Admitting: Family Medicine

## 2019-11-30 DIAGNOSIS — M545 Low back pain: Secondary | ICD-10-CM | POA: Diagnosis not present

## 2019-12-03 ENCOUNTER — Other Ambulatory Visit (INDEPENDENT_AMBULATORY_CARE_PROVIDER_SITE_OTHER): Payer: Medicare Other

## 2019-12-03 ENCOUNTER — Ambulatory Visit: Payer: Medicare Other

## 2019-12-03 ENCOUNTER — Ambulatory Visit (INDEPENDENT_AMBULATORY_CARE_PROVIDER_SITE_OTHER): Payer: Medicare Other

## 2019-12-03 ENCOUNTER — Other Ambulatory Visit: Payer: Self-pay

## 2019-12-03 ENCOUNTER — Telehealth: Payer: Self-pay | Admitting: Family Medicine

## 2019-12-03 DIAGNOSIS — E7800 Pure hypercholesterolemia, unspecified: Secondary | ICD-10-CM

## 2019-12-03 DIAGNOSIS — Z0000 Encounter for general adult medical examination without abnormal findings: Secondary | ICD-10-CM

## 2019-12-03 DIAGNOSIS — E559 Vitamin D deficiency, unspecified: Secondary | ICD-10-CM

## 2019-12-03 DIAGNOSIS — M8589 Other specified disorders of bone density and structure, multiple sites: Secondary | ICD-10-CM

## 2019-12-03 DIAGNOSIS — E8881 Metabolic syndrome: Secondary | ICD-10-CM

## 2019-12-03 LAB — LIPID PANEL
Cholesterol: 122 mg/dL (ref 0–200)
HDL: 44.8 mg/dL (ref >39.00)
LDL Cholesterol: 65 mg/dL (ref 0–99)
NonHDL: 77.53
Total CHOL/HDL Ratio: 3
Triglycerides: 65 mg/dL (ref 0.0–149.0)
VLDL: 13 mg/dL (ref 0.0–40.0)

## 2019-12-03 LAB — COMPREHENSIVE METABOLIC PANEL
ALT: 9 U/L (ref 0–35)
AST: 13 U/L (ref 0–37)
Albumin: 3.7 g/dL (ref 3.5–5.2)
Alkaline Phosphatase: 57 U/L (ref 39–117)
BUN: 15 mg/dL (ref 6–23)
CO2: 31 mEq/L (ref 19–32)
Calcium: 8.9 mg/dL (ref 8.4–10.5)
Chloride: 100 mEq/L (ref 96–112)
Creatinine, Ser: 0.81 mg/dL (ref 0.40–1.20)
GFR: 82.59 mL/min (ref >60.00)
Glucose, Bld: 121 mg/dL — ABNORMAL HIGH (ref 70–99)
Potassium: 3.3 mEq/L — ABNORMAL LOW (ref 3.5–5.1)
Sodium: 138 mEq/L (ref 135–145)
Total Bilirubin: 0.4 mg/dL (ref 0.2–1.2)
Total Protein: 7 g/dL (ref 6.0–8.3)

## 2019-12-03 LAB — VITAMIN D 25 HYDROXY (VIT D DEFICIENCY, FRACTURES): VITD: 65.64 ng/mL (ref 30.00–100.00)

## 2019-12-03 NOTE — Progress Notes (Signed)
PCP notes:  Health Maintenance: No gaps noted   Abnormal Screenings: none   Patient concerns: Patient is having problems with heartburn and acid reflux.   Nurse concerns: none   Next PCP appt.: 12/11/2019 @ 9 am

## 2019-12-03 NOTE — Progress Notes (Signed)
No critical labs need to be addressed urgently. We will discuss labs in detail at upcoming office visit.   

## 2019-12-03 NOTE — Patient Instructions (Signed)
Tracy Dixon , Thank you for taking time to come for your Medicare Wellness Visit. I appreciate your ongoing commitment to your health goals. Please review the following plan we discussed and let me know if I can assist you in the future.   Screening recommendations/referrals: Colonoscopy: Cologuard completed 02/10/2018 Mammogram: Up to date, completed 01/06/2019 Bone Density: Up to date, completed 12/30/2017 Recommended yearly ophthalmology/optometry visit for glaucoma screening and checkup Recommended yearly dental visit for hygiene and checkup  Vaccinations: Influenza vaccine: Up to date, completed 08/26/2019 Pneumococcal vaccine: Completed series Tdap vaccine: decline Shingles vaccine: #1 completed 08/10/2019    Advanced directives: Advance directive discussed with you today. Even though you declined this today please call our office should you change your mind and we can give you the proper paperwork for you to fill out.  Conditions/risks identified: hypertension  Next appointment: 12/11/2019 @ 9 am    Preventive Care 65 Years and Older, Female Preventive care refers to lifestyle choices and visits with your health care provider that can promote health and wellness. What does preventive care include?  A yearly physical exam. This is also called an annual well check.  Dental exams once or twice a year.  Routine eye exams. Ask your health care provider how often you should have your eyes checked.  Personal lifestyle choices, including:  Daily care of your teeth and gums.  Regular physical activity.  Eating a healthy diet.  Avoiding tobacco and drug use.  Limiting alcohol use.  Practicing safe sex.  Taking low-dose aspirin every day.  Taking vitamin and mineral supplements as recommended by your health care provider. What happens during an annual well check? The services and screenings done by your health care provider during your annual well check will depend on your age,  overall health, lifestyle risk factors, and family history of disease. Counseling  Your health care provider may ask you questions about your:  Alcohol use.  Tobacco use.  Drug use.  Emotional well-being.  Home and relationship well-being.  Sexual activity.  Eating habits.  History of falls.  Memory and ability to understand (cognition).  Work and work Statistician.  Reproductive health. Screening  You may have the following tests or measurements:  Height, weight, and BMI.  Blood pressure.  Lipid and cholesterol levels. These may be checked every 5 years, or more frequently if you are over 61 years old.  Skin check.  Lung cancer screening. You may have this screening every year starting at age 57 if you have a 30-pack-year history of smoking and currently smoke or have quit within the past 15 years.  Fecal occult blood test (FOBT) of the stool. You may have this test every year starting at age 59.  Flexible sigmoidoscopy or colonoscopy. You may have a sigmoidoscopy every 5 years or a colonoscopy every 10 years starting at age 34.  Hepatitis C blood test.  Hepatitis B blood test.  Sexually transmitted disease (STD) testing.  Diabetes screening. This is done by checking your blood sugar (glucose) after you have not eaten for a while (fasting). You may have this done every 1-3 years.  Bone density scan. This is done to screen for osteoporosis. You may have this done starting at age 22.  Mammogram. This may be done every 1-2 years. Talk to your health care provider about how often you should have regular mammograms. Talk with your health care provider about your test results, treatment options, and if necessary, the need for more tests. Vaccines  Your health care provider may recommend certain vaccines, such as:  Influenza vaccine. This is recommended every year.  Tetanus, diphtheria, and acellular pertussis (Tdap, Td) vaccine. You may need a Td booster every 10  years.  Zoster vaccine. You may need this after age 65.  Pneumococcal 13-valent conjugate (PCV13) vaccine. One dose is recommended after age 14.  Pneumococcal polysaccharide (PPSV23) vaccine. One dose is recommended after age 45. Talk to your health care provider about which screenings and vaccines you need and how often you need them. This information is not intended to replace advice given to you by your health care provider. Make sure you discuss any questions you have with your health care provider. Document Released: 12/09/2015 Document Revised: 08/01/2016 Document Reviewed: 09/13/2015 Elsevier Interactive Patient Education  2017 [place] Prevention in the Home Falls can cause injuries. They can happen to people of all ages. There are many things you can do to make your home safe and to help prevent falls. What can I do on the outside of my home?  Regularly fix the edges of walkways and driveways and fix any cracks.  Remove anything that might make you trip as you walk through a door, such as a raised step or threshold.  Trim any bushes or trees on the path to your home.  Use bright outdoor lighting.  Clear any walking paths of anything that might make someone trip, such as rocks or tools.  Regularly check to see if handrails are loose or broken. Make sure that both sides of any steps have handrails.  Any raised decks and porches should have guardrails on the edges.  Have any leaves, snow, or ice cleared regularly.  Use sand or salt on walking paths during winter.  Clean up any spills in your garage right away. This includes oil or grease spills. What can I do in the bathroom?  Use night lights.  Install grab bars by the toilet and in the tub and shower. Do not use towel bars as grab bars.  Use non-skid mats or decals in the tub or shower.  If you need to sit down in the shower, use a plastic, non-slip stool.  Keep the floor dry. Clean up any water that  spills on the floor as soon as it happens.  Remove soap buildup in the tub or shower regularly.  Attach bath mats securely with double-sided non-slip rug tape.  Do not have throw rugs and other things on the floor that can make you trip. What can I do in the bedroom?  Use night lights.  Make sure that you have a light by your bed that is easy to reach.  Do not use any sheets or blankets that are too big for your bed. They should not hang down onto the floor.  Have a firm chair that has side arms. You can use this for support while you get dressed.  Do not have throw rugs and other things on the floor that can make you trip. What can I do in the kitchen?  Clean up any spills right away.  Avoid walking on wet floors.  Keep items that you use a lot in easy-to-reach places.  If you need to reach something above you, use a strong step stool that has a grab bar.  Keep electrical cords out of the way.  Do not use floor polish or wax that makes floors slippery. If you must use wax, use non-skid floor wax.  Do  not have throw rugs and other things on the floor that can make you trip. What can I do with my stairs?  Do not leave any items on the stairs.  Make sure that there are handrails on both sides of the stairs and use them. Fix handrails that are broken or loose. Make sure that handrails are as long as the stairways.  Check any carpeting to make sure that it is firmly attached to the stairs. Fix any carpet that is loose or worn.  Avoid having throw rugs at the top or bottom of the stairs. If you do have throw rugs, attach them to the floor with carpet tape.  Make sure that you have a light switch at the top of the stairs and the bottom of the stairs. If you do not have them, ask someone to add them for you. What else can I do to help prevent falls?  Wear shoes that:  Do not have high heels.  Have rubber bottoms.  Are comfortable and fit you well.  Are closed at the  toe. Do not wear sandals.  If you use a stepladder:  Make sure that it is fully opened. Do not climb a closed stepladder.  Make sure that both sides of the stepladder are locked into place.  Ask someone to hold it for you, if possible.  Clearly mark and make sure that you can see:  Any grab bars or handrails.  First and last steps.  Where the edge of each step is.  Use tools that help you move around (mobility aids) if they are needed. These include:  Canes.  Walkers.  Scooters.  Crutches.  Turn on the lights when you go into a dark area. Replace any light bulbs as soon as they burn out.  Set up your furniture so you have a clear path. Avoid moving your furniture around.  If any of your floors are uneven, fix them.  If there are any pets around you, be aware of where they are.  Review your medicines with your doctor. Some medicines can make you feel dizzy. This can increase your chance of falling. Ask your doctor what other things that you can do to help prevent falls. This information is not intended to replace advice given to you by your health care provider. Make sure you discuss any questions you have with your health care provider. Document Released: 09/08/2009 Document Revised: 04/19/2016 Document Reviewed: 12/17/2014 Elsevier Interactive Patient Education  2017 [employer].

## 2019-12-03 NOTE — Progress Notes (Signed)
Subjective:   Tracy Dixon is a 79 y.o. female who presents for Medicare Annual (Subsequent) preventive examination.  Review of Systems: N/A   This visit is being conducted through telemedicine via telephone at the nurse health advisor's home address due to the COVID-19 pandemic. This patient has given me verbal consent via doximity to conduct this visit, patient states they are participating from their home address. Patient and myself are on the telephone call. There is no referral for this visit. Some vital signs may be absent or patient reported.    Patient identification: identified by name, DOB, and current address   Cardiac Risk Factors include: advanced age (>14mn, >>29women);hypertension     Objective:     Vitals: There were no vitals taken for this visit.  There is no height or weight on file to calculate BMI.  Advanced Directives 12/03/2019 11/27/2018 06/04/2018 05/27/2018 11/12/2017 10/16/2016 06/11/2013  Does Patient Have a Medical Advance Directive? No No No No No Yes;No Patient does not have advance directive;Patient would not like information  Would patient like information on creating a medical advance directive? No - Patient declined No - Patient declined No - Patient declined No - Patient declined Yes (MAU/Ambulatory/Procedural Areas - Information given) No - Patient declined -  Pre-existing out of facility DNR order (yellow form or pink MOST form) - - - - - - No    Tobacco Social History   Tobacco Use  Smoking Status Former Smoker  . Years: 15.00  . Types: Cigarettes  . Quit date: 06/02/1973  . Years since quitting: 46.5  Smokeless Tobacco Never Used  Tobacco Comment   few cigs/day     Counseling given: Not Answered Comment: few cigs/day   Clinical Intake:  Pre-visit preparation completed: Yes  Pain : No/denies pain     Nutritional Risks: None Diabetes: No  How often do you need to have someone help you when you read instructions, pamphlets, or  other written materials from your doctor or pharmacy?: 1 - Never What is the last grade level you completed in school?: BS degree  Interpreter Needed?: No  Information entered by :: CJohnson, LPN  Past Medical History:  Diagnosis Date  . Acoustic neuroma ([facility]    [place] [facility] . Allergy   . Arthritis   . Asthma   . Complication of anesthesia    stays sleepy  . Diverticulosis of colon   . Gallstones   . Hyperlipidemia   . Hypertension   . Osteoarthritis   . Symptomatic mammary hypertrophy    Past Surgical History:  Procedure Laterality Date  . Bilateral tubal ligation  2005  . BREAST REDUCTION SURGERY Bilateral 06/04/2018   Procedure: MAMMARY REDUCTION  (BREAST);  Surgeon: DWallace Going DO;  [place] [facility]  Service: Plastics;  Laterality: Bilateral;  . Carotid doppler  07/2006   Neg  . CESAREAN SECTION  2005  . CHOLECYSTECTOMY  1982  . DECOMPRESSIVE LUMBAR LAMINECTOMY LEVEL 3  06/10/2013   Procedure: DECOMPRESSIVE LUMBAR LAMINECTOMY LEVEL 3;  Surgeon: DMelina Schools MD;  [place] [facility]  Service: Orthopedics;;  lumbar three to five decompression  . MRI brain  2007   acoustic neuroma (check yearly with MRI)  . PFT's  12/2006   (-) FEV1/FVC 79%, FEV1 84%  . REDUCTION MAMMAPLASTY Bilateral 05/2018  . REPLACEMENT TOTAL KNEE BILATERAL  2005  . TONSILLECTOMY  2005  . TUBAL LIGATION  Family History  Problem Relation Age of Onset  . Cancer Mother        Lung CA  . Stroke Mother        CVA  . Cancer Father        Prostate  . Heart disease Neg Hx   . Breast cancer Neg Hx    Social History   Socioeconomic History  . Marital status: Married    Spouse name: Not on file  . Number of children: 4  . Years of education: Not on file  . Highest education level: Not on file  Occupational History  . 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 Smoking status: Former Smoker     Years: 15.00    Types: Cigarettes    Quit date: 06/02/1973    Years since quitting: 46.5  . Smokeless tobacco: Never Used  . Tobacco comment: few cigs/day  Substance and Sexual Activity  . Alcohol use: No  . Drug use: No  . Sexual activity: Not on file  Other Topics Concern  . Not on file  Social History Narrative   Regular exercise:  No   Diet:  Fruits and veggies, rare FF, no water    No living will, no HCPOA.   Social Determinants of Health   Financial Resource Strain: Low Risk   . Difficulty of Paying Living Expenses: Not hard at all  Food Insecurity: No Food Insecurity  . Worried About Charity fundraiser in the Last Year: Never true  . Ran Out of Food in the Last Year: Never true  Transportation Needs: No Transportation Needs  . Lack of Transportation (Medical): No  . Lack of Transportation (Non-Medical): No  Physical Activity: Inactive  . Days of Exercise per Week: 0 days  . Minutes of Exercise per Session: 0 min  Stress: Stress Concern Present  . Feeling of Stress : To some extent  Social Connections:   . Frequency of Communication with Friends and Family: Not on file  . Frequency of Social Gatherings with Friends and Family: Not on file  . Attends Religious Services: Not on file  . Active Member of Clubs or Organizations: Not on file  . Attends Archivist Meetings: Not on file  . Marital Status: Not on file    Outpatient Encounter Medications as of 12/03/2019  Medication Sig  . albuterol (PROVENTIL HFA;VENTOLIN HFA) 108 (90 BASE) MCG/ACT inhaler Inhale 1-2 puffs into the lungs every 6 (six) hours as needed for shortness of breath (for use when in contact with dogs or cats.).  Marland Kitchen amLODipine (NORVASC) 10 MG tablet TAKE 1 TABLET BY MOUTH  DAILY  . amoxicillin (AMOXIL) 500 MG capsule   . atorvastatin (LIPITOR) 20 MG tablet TAKE 1 TABLET BY MOUTH  DAILY  . cetirizine (ZYRTEC) 10 MG tablet Take 10 mg by mouth daily as needed for allergies. Reported on 01/10/2016   . cholecalciferol (VITAMIN D) 1000 UNITS tablet Take 1,000 Units by mouth daily.  . hydrochlorothiazide (MICROZIDE) 12.5 MG capsule Take 1 capsule (12.5 mg total) by mouth daily.  Marland Kitchen omeprazole (PRILOSEC OTC) 20 MG tablet Take 20 mg by mouth daily as needed (heartburn or indigestion).  . potassium chloride SA (KLOR-CON) 20 MEQ tablet potassium chloride ER 20 mEq tablet,extended release(part/cryst)  . Zoster Vaccine Adjuvanted [facility]) injection Shingrix (PF) 50 mcg/0.5 mL intramuscular suspension, kit  . NONFORMULARY OR COMPOUNDED ITEM [facility]:  Antifungal topical - Terbinafine  3%, Fluconazole 2%, Tea Tree Oil 5%, Ibuprofen 2%, in #37m DMSO Suspension. Apply to affected toenail(s) once at bedtime or twice daily.  . predniSONE (DELTASONE) 20 MG tablet prednisone 20 mg tablet   No facility-administered encounter medications on file as of 12/03/2019.    Activities of Daily Living In your present state of health, do you have any difficulty performing the following activities: 12/03/2019  Hearing? 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 N  Difficulty concentrating or making decisions? N  Walking or climbing stairs? N  Dressing or bathing? N  Doing errands, shopping? N  Preparing Food and eating ? N  Using the Toilet? N  In the past six months, have you accidently leaked urine? N  Do you have problems with loss of bowel control? N  Managing your Medications? N  Managing your Finances? N  Housekeeping or managing your Housekeeping? N  Some recent data might be hidden    Patient Care Team: BJinny Sanders MD as PCP - General MSanjuana Kava MD as Referring Physician (Otolaryngology) BMelina Schools MD as Consulting Physician (Orthopedic Surgery) GRoseanne Kaufman MD as Consulting Physician (Orthopedic Surgery) OParalee Cancel MD as Consulting Physician (Orthopedic Surgery) [place] CLoel Lofty DO as Attending Physician (Plastic Surgery)    Assessment:   This  is a routine wellness examination for SErlene  Exercise Activities and Dietary recommendations Current Exercise Habits: The patient does not participate in regular exercise at present, Exercise limited by: None identified  Goals    . Patient Stated     Starting 11/27/2018, I will continue to take medications as prescribed.     . Patient Stated     12/03/2019, I will try to work on my mental health and have a more positive attitude.        Fall Risk Fall Risk  12/03/2019 11/27/2018 10/14/2018 11/12/2017 10/16/2016  Falls in the past year? 0 1 1 Yes No  Comment - single fall as result of tripping Emmi Telephone Survey: data to providers prior to load - -  Number falls in past yr: 0 0 1 2 or more -  Comment - - Emmi Telephone Survey Actual Response = 1 - -  Injury with Fall? 0 1 0 No -  Risk for fall due to : Medication side effect - - - -  Follow up Falls evaluation completed;Falls prevention discussed - - - -   Is the patient's home free of loose throw rugs in walkways, pet beds, electrical cords, etc?   yes      Grab bars in the bathroom? no      Handrails on the stairs?   yes      Adequate lighting?   yes  Timed Get Up and Go performed: N/A  Depression Screen PHQ 2/9 Scores 12/03/2019 11/27/2018 11/12/2017 10/16/2016  PHQ - 2 Score 0 0 0 0  PHQ- 9 Score 0 0 0 -     Cognitive Function MMSE - Mini Mental State Exam 12/03/2019 11/27/2018 11/12/2017  Orientation to time '5 5 5  ' Orientation to Place '5 5 5  ' Registration '3 3 3  ' Attention/ Calculation 5 0 0  Recall '3 3 3  ' Language- name 2 objects - 0 0  Language- repeat '1 1 1  ' Language- follow 3 step command - 3 2  Language- follow 3 step command-comments - - unable to follow 1 step of 3 step command  Language- read & follow direction - 0 0  Write a sentence - 0 0  Copy design - 0 0  Total score - 20 19  Mini Cog  Mini-Cog screen was completed. Maximum score is 22. A value of 0 denotes this part of the MMSE was not completed or the  patient failed this part of the Mini-Cog screening.       Immunization History  Administered Date(s) Administered  . Fluad Quad(high Dose 65+) 07/30/2019  . H1N1 01/12/2009  . Influenza Split 08/28/2011, 08/28/2012  . Influenza Whole 09/19/2007, 08/23/2008, 09/07/2009, 08/30/2010  . Influenza,inj,Quad PF,6+ Mos 08/04/2013, 08/24/2014, 09/02/2015, 08/28/2016, 09/03/2017, 09/04/2018  . Influenza,inj,quad, With Preservative 08/26/2019  . Influenza-Unspecified 09/02/2017  . Pneumococcal Conjugate-13 09/30/2014  . Pneumococcal Polysaccharide-23 09/21/2008  . Td 01/25/2007  . Zoster 08/20/2013  . Zoster Recombinat (Shingrix) 08/10/2019    Qualifies for Shingles Vaccine? #1 08/10/2019  Screening Tests Health Maintenance  Topic Date Due  . TETANUS/TDAP  11/25/2049 (Originally 01/24/2017)  . INFLUENZA VACCINE  Completed  . DEXA SCAN  Completed  . PNA vac Low Risk Adult  Completed    Cancer Screenings: Lung: Low Dose CT Chest recommended if Age 77-80 years, 30 pack-year currently smoking OR have quit w/in 15years. Patient does not qualify. Breast:  Up to date on Mammogram? Yes, completed 01/06/2019   Up to date of Bone Density/Dexa? Yes, completed 12/30/2017 Colorectal: Cologuard completed 02/10/2018  Additional Screenings:  Hepatitis C Screening: N/A     Plan:    Patient will work on her mental health and have a more positive attitude.   I have personally reviewed and noted the following in the patient's chart:   . Medical and social history . Use of alcohol, tobacco or illicit drugs  . Current medications and supplements . Functional ability and status . Nutritional status . Physical activity . Advanced directives . List of other physicians . Hospitalizations, surgeries, and ER visits in previous 12 months . Vitals . Screenings to include cognitive, depression, and falls . Referrals and appointments  In addition, I have reviewed and discussed with patient certain  preventive protocols, quality metrics, and best practice recommendations. A written personalized care plan for preventive services as well as general preventive health recommendations were provided to patient.     Andrez Grime, LPN  03/03/5926

## 2019-12-03 NOTE — Telephone Encounter (Signed)
-----   Message from Ellamae Sia sent at 11/19/2019 10:40 AM EST ----- Regarding: Lab orders for Thursday, 1.7.21 Patient is scheduled for CPX labs, please order future labs, Thanks , Karna Christmas

## 2019-12-05 ENCOUNTER — Ambulatory Visit: Payer: Medicare Other | Attending: Internal Medicine

## 2019-12-05 DIAGNOSIS — Z23 Encounter for immunization: Secondary | ICD-10-CM | POA: Diagnosis not present

## 2019-12-05 NOTE — Progress Notes (Signed)
   Covid-19 Vaccination Clinic  Name:  Tracy Dixon    MRN: [id number] DOB: January 24, 1941  12/05/2019  [name] was observed post Covid-19 immunization for 15 minutes without incidence. She was provided with Vaccine Information Sheet and instruction to access the V-Safe system.   [name] was instructed to call 911 with any severe reactions post vaccine: Marland Kitchen Difficulty breathing  . Swelling of your face and throat  . A fast heartbeat  . A bad rash all over your body  . Dizziness and weakness    Immunizations Administered    Name Date Dose VIS Date Route   Pfizer COVID-19 Vaccine 12/05/2019 11:44 AM 0.3 mL 11/06/2019 Intramuscular   Manufacturer: [place]   Lot: [id number]   Universal: [id number]

## 2019-12-11 ENCOUNTER — Encounter: Payer: Medicare Other | Admitting: Family Medicine

## 2019-12-11 ENCOUNTER — Ambulatory Visit (INDEPENDENT_AMBULATORY_CARE_PROVIDER_SITE_OTHER): Payer: Medicare Other | Admitting: Family Medicine

## 2019-12-11 ENCOUNTER — Other Ambulatory Visit: Payer: Medicare Other

## 2019-12-11 ENCOUNTER — Other Ambulatory Visit: Payer: Self-pay

## 2019-12-11 VITALS — BP 130/70 | HR 94 | Temp 97.7°F | Ht 60.75 in | Wt 175.0 lb

## 2019-12-11 DIAGNOSIS — E559 Vitamin D deficiency, unspecified: Secondary | ICD-10-CM

## 2019-12-11 DIAGNOSIS — D333 Benign neoplasm of cranial nerves: Secondary | ICD-10-CM | POA: Diagnosis not present

## 2019-12-11 DIAGNOSIS — R7303 Prediabetes: Secondary | ICD-10-CM | POA: Diagnosis not present

## 2019-12-11 DIAGNOSIS — E7800 Pure hypercholesterolemia, unspecified: Secondary | ICD-10-CM

## 2019-12-11 DIAGNOSIS — I10 Essential (primary) hypertension: Secondary | ICD-10-CM | POA: Diagnosis not present

## 2019-12-11 LAB — POCT GLYCOSYLATED HEMOGLOBIN (HGB A1C): Hemoglobin A1C: 5.3 % (ref 4.0–5.6)

## 2019-12-11 MED ORDER — ALBUTEROL SULFATE HFA 108 (90 BASE) MCG/ACT IN AERS: 1.0000 | INHALATION_SPRAY | Freq: Four times a day (QID) | RESPIRATORY_TRACT | 0 refills | Status: DC | PRN

## 2019-12-11 NOTE — Progress Notes (Signed)
Chief Complaint  Patient presents with  . Annual Exam    Part 2    History of Present Illness: HPI  The patient presents for review of chronic health problems. He/She also has the following acute concerns today: none  The patient saw a LPN or RN for medicare wellness visit.  Prevention and wellness was reviewed in detail. 12/03/2019 Note reviewed and important notes copied below. Health Maintenance: No gaps noted Abnormal Screenings: none   01/21/20  Hypertension:    BP Readings from Last 3 Encounters:  12/11/19 130/70  04/20/19 132/78  04/17/19 137/83  Using medication without problems or lightheadedness: none Chest pain with exertion:none Edema:none Short of breath:none Average home BPs: Other issues:  Elevated Cholesterol:  At goal on  atorvastatin Lab Results  Component Value Date   CHOL 122 12/03/2019   HDL 44.80 12/03/2019   LDLCALC 65 12/03/2019   TRIG 65.0 12/03/2019   CHOLHDL 3 12/03/2019  Using medications without problems:none Muscle aches: none Diet compliance:moderate Exercise: minimal Other complaints: prediabetes  Lab Results  Component Value Date   HGBA1C 5.3 12/11/2019   Hypokalemia: slightly low on recent labs. Likely due to HCTZ.   Vit D def: resolved on supplement.   This visit occurred during the SARS-CoV-2 public health emergency.  Safety protocols were in place, including screening questions prior to the visit, additional usage of staff PPE, and extensive cleaning of exam room while observing appropriate contact time as indicated for disinfecting solutions.   COVID 19 screen:  No recent travel or known exposure to COVID19 The patient denies respiratory symptoms of COVID 19 at this time. The importance of social distancing was discussed today.     ROS    Past Medical History:  Diagnosis Date  . 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 Allergy   . Arthritis   . Asthma   . Complication of  anesthesia    stays sleepy  . Diverticulosis of colon   . Gallstones   . Hyperlipidemia   . Hypertension   . Osteoarthritis   . Symptomatic mammary hypertrophy     reports that she quit smoking about 46 years ago. Her smoking use included cigarettes. She quit after 15.00 years of use. She has never used smokeless tobacco. She reports that she does not drink alcohol or use drugs.   Current Outpatient Medications:  .  albuterol (PROVENTIL HFA;VENTOLIN HFA) 108 (90 BASE) MCG/ACT inhaler, Inhale 1-2 puffs into the lungs every 6 (six) hours as needed for shortness of breath (for use when in contact with dogs or cats.)., Disp: 3 Inhaler, Rfl: 0 .  amLODipine (NORVASC) 10 MG tablet, TAKE 1 TABLET BY MOUTH  DAILY, Disp: 90 tablet, Rfl: 0 .  amoxicillin (AMOXIL) 500 MG capsule, , Disp: , Rfl:  .  atorvastatin (LIPITOR) 20 MG tablet, TAKE 1 TABLET BY MOUTH  DAILY, Disp: 90 tablet, Rfl: 0 .  cetirizine (ZYRTEC) 10 MG tablet, Take 10 mg by mouth daily as needed for allergies. Reported on 01/10/2016, Disp: , Rfl:  .  cholecalciferol (VITAMIN D) 1000 UNITS tablet, Take 1,000 Units by mouth daily., Disp: , Rfl:  .  hydrochlorothiazide (MICROZIDE) 12.5 MG capsule, Take 1 capsule (12.5 mg total) by mouth daily., Disp: 90 capsule, Rfl: 3 .  potassium chloride SA (KLOR-CON) 20 MEQ tablet, potassium chloride ER 20 mEq tablet,extended release(part/cryst), Disp: , Rfl:  .  Zoster Vaccine Adjuvanted [facility]) injection, Shingrix (PF) 50 mcg/0.5  mL intramuscular suspension, kit, Disp: , Rfl:    Observations/Objective: Blood pressure 130/70, pulse 94, temperature 97.7 F (36.5 C), temperature source Temporal, height 5' 0.75" (1.543 m), weight 175 lb (79.4 kg), SpO2 96 %.  Physical Exam Constitutional:      General: She is not in acute distress.    Appearance: Normal appearance. She is well-developed. She is obese. She is not ill-appearing or toxic-appearing.  HENT:     Head: Normocephalic.     Right Ear:  Hearing, tympanic membrane, ear canal and external ear normal.     Left Ear: Hearing, tympanic membrane, ear canal and external ear normal.     Nose: Nose normal.  Eyes:     General: Lids are normal. Lids are everted, no foreign bodies appreciated.     Conjunctiva/sclera: Conjunctivae normal.     Pupils: Pupils are equal, round, and reactive to light.  Neck:     Thyroid: No thyroid mass or thyromegaly.     Vascular: No carotid bruit.     Trachea: Trachea normal.  Cardiovascular:     Rate and Rhythm: Normal rate and regular rhythm.     Heart sounds: Normal heart sounds, S1 normal and S2 normal. No murmur. No gallop.   Pulmonary:     Effort: Pulmonary effort is normal. No respiratory distress.     Breath sounds: Normal breath sounds. No wheezing, rhonchi or rales.  Abdominal:     General: Bowel sounds are normal. There is no distension or abdominal bruit.     Palpations: Abdomen is soft. There is no fluid wave or mass.     Tenderness: There is no abdominal tenderness. There is no guarding or rebound.     Hernia: No hernia is present.  Musculoskeletal:     Cervical back: Normal range of motion and neck supple.  Lymphadenopathy:     Cervical: No cervical adenopathy.  Skin:    General: Skin is warm and dry.     Findings: No rash.  Neurological:     Mental Status: She is alert.     Cranial Nerves: No cranial nerve deficit.     Sensory: No sensory deficit.  Psychiatric:        Mood and Affect: Mood is not anxious or depressed.        Speech: Speech normal.        Behavior: Behavior normal. Behavior is cooperative.        Judgment: Judgment normal.      Assessment and Plan   The patient's preventative maintenance and recommended screening tests for an annual wellness exam were reviewed in full today. Brought up to date unless services declined.  Counselled on the importance of diet, exercise, and its role in overall health and mortality. The patient's FH and SH was reviewed,  including their home life, tobacco status, and drug and alcohol status.   Vaccines:uptodate, refused tdap. Has gotten COVID19 Pap/DVE:No pap/no DVE indicated,No family history of uterine or ovarian cancer. No vaginal bleeding, no discharge. 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  Vitamin D deficiency Resolved on supplement.  Prediabetes Low carb diet, increase exercise.  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  Essential hypertension, benign Well controlled. Continue current medication.   Increase potassium in diet.. Mild hypokalemia: Likely due to HCTZ.  High cholesterol Well  controlled. Continue current medication.  On STATIN.   Eliezer Lofts, MD

## 2019-12-11 NOTE — Patient Instructions (Signed)
Increase potassium in diet.  Decrease carbohydrates in diet.

## 2019-12-14 ENCOUNTER — Encounter: Payer: Self-pay | Admitting: Podiatry

## 2019-12-14 ENCOUNTER — Other Ambulatory Visit: Payer: Self-pay

## 2019-12-14 ENCOUNTER — Ambulatory Visit (INDEPENDENT_AMBULATORY_CARE_PROVIDER_SITE_OTHER): Payer: Medicare Other | Admitting: Podiatry

## 2019-12-14 DIAGNOSIS — L84 Corns and callosities: Secondary | ICD-10-CM

## 2019-12-14 DIAGNOSIS — B351 Tinea unguium: Secondary | ICD-10-CM | POA: Diagnosis not present

## 2019-12-14 DIAGNOSIS — M79674 Pain in right toe(s): Secondary | ICD-10-CM

## 2019-12-14 DIAGNOSIS — M79675 Pain in left toe(s): Secondary | ICD-10-CM

## 2019-12-14 NOTE — Patient Instructions (Signed)

## 2019-12-19 NOTE — Progress Notes (Signed)
Subjective: Tracy Dixon presents today for follow up of painful mycotic nails b/l that are difficult to trim. Pain interferes with ambulation. Aggravating factors include wearing enclosed shoe gear. Pain is relieved with periodic professional debridement. Patient also presents with painful calluses b/l which are painful when weightbearing with or without shoe gear.  Allergies  Allergen Reactions  . Contrast Media [Iodinated Diagnostic Agents] Hives  . Crab [Shellfish Allergy] Hives    Objective: There were no vitals filed for this visit.  Vascular Examination:  capillary refill time to digits immediate b/l, palpable DP pulses b/l, palpable PT pulses b/l, pedal hair present b/l, skin temperature gradient within normal limits b/l and varicosities present b/l  Dermatological Examination: Pedal skin with normal turgor, texture and tone bilaterally., No open wounds bilaterally. and No interdigital macerations bilaterally.  onychomycosis of the toenails 1-5 b/l which exhibit, elongation, dystrophy, discoloration, thickening, subungual debris and pain to palpation of nail beds  Musculoskeletal: normal muscle strength 5/5 to all lower extremity muscle groups bilaterally, no gross bony deformities bilaterally, no pain crepitus or joint limitation noted with ROM and hammertoes noted to the 2nd toe, 3rd toe, 4th toe, 5th toe, b/l  Neurological: sensation intact 5/5 intact bilaterally with 10g monofilament  Assessment: 1. Pain due to onychomycosis of toenails of both feet   2. Corns and callosities   3. Pain in toes of both feet     Plan: -Toenails 1-5 b/l were debrided in length and girth without iatrogenic bleeding. -Painful porokeratotic lesions submet head 2 right foot and painful corn right 3rd digit pared and enucleated with sterile scalpel blade -Patient to continue soft, supportive shoe gear daily. -Patient to report any pedal injuries to medical professional  immediately. -Patient/POA to call should there be question/concern in the interim.  Return in about 9 weeks (around 02/15/2020).

## 2019-12-22 ENCOUNTER — Other Ambulatory Visit: Payer: Self-pay | Admitting: Family Medicine

## 2019-12-22 DIAGNOSIS — Z1231 Encounter for screening mammogram for malignant neoplasm of breast: Secondary | ICD-10-CM

## 2019-12-24 ENCOUNTER — Ambulatory Visit: Payer: Medicare Other

## 2019-12-26 ENCOUNTER — Ambulatory Visit: Payer: Medicare Other | Attending: Internal Medicine

## 2019-12-26 DIAGNOSIS — Z23 Encounter for immunization: Secondary | ICD-10-CM

## 2019-12-26 NOTE — Progress Notes (Signed)
   Covid-19 Vaccination Clinic  Name:  Tracy Dixon    MRN: [id number] DOB: 1941/07/11  12/26/2019  [name] was observed post Covid-19 immunization for 15 minutes without incidence. She was provided with Vaccine Information Sheet and instruction to access the V-Safe system.   [name] was instructed to call 911 with any severe reactions post vaccine: Marland Kitchen Difficulty breathing  . Swelling of your face and throat  . A fast heartbeat  . A bad rash all over your body  . Dizziness and weakness    Immunizations Administered    Name Date Dose VIS Date Route   Pfizer COVID-19 Vaccine 12/26/2019  8:12 AM 0.3 mL 11/06/2019 Intramuscular   Manufacturer: [place]   Lot: [id number]   [place]: [id number]

## 2020-01-18 ENCOUNTER — Ambulatory Visit
Admission: RE | Admit: 2020-01-18 | Discharge: 2020-01-18 | Disposition: A | Discharge status: Home / Self Care | Payer: Medicare Other | Source: Ambulatory Visit | Attending: Family Medicine | Admitting: Family Medicine

## 2020-01-18 DIAGNOSIS — Z1231 Encounter for screening mammogram for malignant neoplasm of breast: Secondary | ICD-10-CM | POA: Insufficient documentation

## 2020-01-19 ENCOUNTER — Other Ambulatory Visit: Payer: Self-pay | Admitting: Family Medicine

## 2020-01-19 DIAGNOSIS — R2231 Localized swelling, mass and lump, right upper limb: Secondary | ICD-10-CM

## 2020-01-19 DIAGNOSIS — R928 Other abnormal and inconclusive findings on diagnostic imaging of breast: Secondary | ICD-10-CM

## 2020-01-21 ENCOUNTER — Encounter: Payer: Self-pay | Admitting: Family Medicine

## 2020-01-21 DIAGNOSIS — E559 Vitamin D deficiency, unspecified: Secondary | ICD-10-CM | POA: Insufficient documentation

## 2020-01-21 DIAGNOSIS — R7303 Prediabetes: Secondary | ICD-10-CM | POA: Insufficient documentation

## 2020-01-21 NOTE — Assessment & Plan Note (Signed)
Resolved on supplement 

## 2020-01-21 NOTE — Assessment & Plan Note (Signed)
Well controlled. Continue current medication.  On STATIN.

## 2020-01-21 NOTE — Assessment & Plan Note (Addendum)
Well controlled. Continue current medication.   Increase potassium in diet.. Mild hypokalemia: Likely due to HCTZ.

## 2020-01-21 NOTE — Assessment & Plan Note (Signed)
Followed by ENT 

## 2020-01-21 NOTE — Assessment & Plan Note (Signed)
Low carb diet, increase exercise.

## 2020-01-22 ENCOUNTER — Ambulatory Visit
Admission: RE | Admit: 2020-01-22 | Discharge: 2020-01-22 | Disposition: A | Discharge status: Home / Self Care | Payer: Medicare Other | Source: Ambulatory Visit | Attending: Family Medicine | Admitting: Family Medicine

## 2020-01-22 DIAGNOSIS — R2231 Localized swelling, mass and lump, right upper limb: Secondary | ICD-10-CM | POA: Insufficient documentation

## 2020-01-22 DIAGNOSIS — R928 Other abnormal and inconclusive findings on diagnostic imaging of breast: Secondary | ICD-10-CM | POA: Insufficient documentation

## 2020-01-22 DIAGNOSIS — N6489 Other specified disorders of breast: Secondary | ICD-10-CM | POA: Diagnosis not present

## 2020-02-09 ENCOUNTER — Other Ambulatory Visit: Payer: Self-pay | Admitting: Family Medicine

## 2020-02-19 ENCOUNTER — Other Ambulatory Visit: Payer: Self-pay | Admitting: Family Medicine

## 2020-03-15 ENCOUNTER — Ambulatory Visit (INDEPENDENT_AMBULATORY_CARE_PROVIDER_SITE_OTHER): Payer: Medicare Other | Admitting: Podiatry

## 2020-03-15 ENCOUNTER — Encounter: Payer: Self-pay | Admitting: Podiatry

## 2020-03-15 ENCOUNTER — Other Ambulatory Visit: Payer: Self-pay

## 2020-03-15 VITALS — Temp 96.1°F

## 2020-03-15 DIAGNOSIS — B351 Tinea unguium: Secondary | ICD-10-CM | POA: Diagnosis not present

## 2020-03-15 DIAGNOSIS — M79674 Pain in right toe(s): Secondary | ICD-10-CM | POA: Diagnosis not present

## 2020-03-15 DIAGNOSIS — Q828 Other specified congenital malformations of skin: Secondary | ICD-10-CM

## 2020-03-15 DIAGNOSIS — M79675 Pain in left toe(s): Secondary | ICD-10-CM | POA: Diagnosis not present

## 2020-03-15 NOTE — Patient Instructions (Signed)
Corns and Calluses Corns are small areas of thickened skin that occur on the top, sides, or tip of a toe. They contain a cone-shaped core with a point that can press on a nerve below. This causes pain.  Calluses are areas of thickened skin that can occur anywhere on the body, including the hands, fingers, palms, soles of the feet, and heels. Calluses are usually larger than corns. What are the causes? Corns and calluses are caused by rubbing (friction) or pressure, such as from shoes that are too tight or do not fit properly. What increases the risk? Corns are more likely to develop in people who have misshapen toes (toe deformities), such as hammer toes. Calluses can occur with friction to any area of the skin. They are more likely to develop in people who:  Work with their hands.  Wear shoes that fit poorly, are too tight, or are high-heeled.  Have toe deformities. What are the signs or symptoms? Symptoms of a corn or callus include:  A hard growth on the skin.  Pain or tenderness under the skin.  Redness and swelling.  Increased discomfort while wearing tight-fitting shoes, if your feet are affected. If a corn or callus becomes infected, symptoms may include:  Redness and swelling that gets worse.  Pain.  Fluid, blood, or pus draining from the corn or callus. How is this diagnosed? Corns and calluses may be diagnosed based on your symptoms, your medical history, and a physical exam. How is this treated? Treatment for corns and calluses may include:  Removing the cause of the friction or pressure. This may involve: ? Changing your shoes. ? Wearing shoe inserts (orthotics) or other protective layers in your shoes, such as a corn pad. ? Wearing gloves.  Applying medicine to the skin (topical medicine) to help soften skin in the hardened, thickened areas.  Removing layers of dead skin with a file to reduce the size of the corn or callus.  Removing the corn or callus with a  scalpel or laser.  Taking antibiotic medicines, if your corn or callus is infected.  Having surgery, if a toe deformity is the cause. Follow these instructions at home:   Take over-the-counter and prescription medicines only as told by your health care provider.  If you were prescribed an antibiotic, take it as told by your health care provider. Do not stop taking it even if your condition starts to improve.  Wear shoes that fit well. Avoid wearing high-heeled shoes and shoes that are too tight or too loose.  Wear any padding, protective layers, gloves, or orthotics as told by your health care provider.  Soak your hands or feet and then use a file or pumice stone to soften your corn or callus. Do this as told by your health care provider.  Check your corn or callus every day for symptoms of infection. Contact a health care provider if you:  Notice that your symptoms do not improve with treatment.  Have redness or swelling that gets worse.  Notice that your corn or callus becomes painful.  Have fluid, blood, or pus coming from your corn or callus.  Have new symptoms. Summary  Corns are small areas of thickened skin that occur on the top, sides, or tip of a toe.  Calluses are areas of thickened skin that can occur anywhere on the body, including the hands, fingers, palms, and soles of the feet. Calluses are usually larger than corns.  Corns and calluses are caused by   rubbing (friction) or pressure, such as from shoes that are too tight or do not fit properly.  Treatment may include wearing any padding, protective layers, gloves, or orthotics as told by your health care provider. This information is not intended to replace advice given to you by your health care provider. Make sure you discuss any questions you have with your health care provider. Document Revised: 03/04/2019 Document Reviewed: 09/25/2017 Elsevier Patient Education  2020 [employer] Inc[employer]  Onychomycosis/Fungal  Toenails  WHAT IS IT? An infection that lies within the keratin of your nail plate that is caused by a fungus.  WHY ME? Fungal infections affect all ages, sexes, races, and creeds.  There may be many factors that predispose you to a fungal infection such as age, coexisting medical conditions such as diabetes, or an autoimmune disease; stress, medications, fatigue, genetics, etc.  Bottom line: fungus thrives in a warm, moist environment and your shoes offer such a location.  IS IT CONTAGIOUS? Theoretically, yes.  You do not want to share shoes, nail clippers or files with someone who has fungal toenails.  Walking around barefoot in the same room or sleeping in the same bed is unlikely to transfer the organism.  It is important to realize, however, that fungus can spread easily from one nail to the next on the same foot.  HOW DO WE TREAT THIS?  There are several ways to treat this condition.  Treatment may depend on many factors such as age, medications, pregnancy, liver and kidney conditions, etc.  It is best to ask your doctor which options are available to you.  4. No treatment.   Unlike many other medical concerns, you can live with this condition.  However for many people this can be a painful condition and may lead to ingrown toenails or a bacterial infection.  It is recommended that you keep the nails cut short to help reduce the amount of fungal nail. 5. Topical treatment.  These range from herbal remedies to prescription strength nail lacquers.  About 40-50% effective, topicals require twice daily application for approximately 9 to 12 months or until an entirely new nail has grown out.  The most effective topicals are medical grade medications available through physicians offices. 6. Oral antifungal medications.  With an 80-90% cure rate, the most common oral medication requires 3 to 4 months of therapy and stays in your system for a year as the new nail grows out.  Oral antifungal medications do  require blood work to make sure it is a safe drug for you.  A liver function panel will be performed prior to starting the medication and after the first month of treatment.  It is important to have the blood work performed to avoid any harmful side effects.  In general, this medication safe but blood work is required. 7. Laser Therapy.  This treatment is performed by applying a specialized laser to the affected nail plate.  This therapy is noninvasive, fast, and non-painful.  It is not covered by insurance and is therefore, out of pocket.  The results have been very good with a 80-95% cure rate.  The [facility] is the only practice in the area to offer this therapy. 8. Permanent Nail Avulsion.  Removing the entire nail so that a new nail will not grow back. 

## 2020-03-17 NOTE — Progress Notes (Signed)
Subjective: Tracy Dixon presents today for follow up of painful porokeratotic lesion(s) submet head 3 right and painful mycotic toenails b/l that limit ambulation. Aggravating factors include weightbearing with and without shoe gear. Pain for both is relieved with periodic professional debridement.   She voices no new pedal problems on today's visit.  Allergies  Allergen Reactions  . Contrast Media [Iodinated Diagnostic Agents] Hives  . Crab [Shellfish Allergy] Hives     Objective: Vitals:   03/15/20 1020  Temp: (!) 96.1 F (35.6 C)   Pt is a pleasant 79 y.o. year old AA female in NAD. AAO x 3.   Vascular Examination:  Capillary refill time to digits immediate b/l. Palpable DP pulses b/l. Palpable PT pulses b/l. Pedal hair present b/l. Skin temperature gradient within normal limits b/l. Varicosities present b/l.  Dermatological Examination: Pedal skin with normal turgor, texture and tone bilaterally. No open wounds bilaterally. No interdigital macerations bilaterally. Toenails 1-5 b/l elongated, dystrophic, thickened, crumbly with subungual debris and tenderness to dorsal palpation. Porokeratotic lesion(s) submet head 3 right foot. No erythema, no edema, no drainage, no flocculence.  Musculoskeletal: Normal muscle strength 5/5 to all lower extremity muscle groups bilaterally, no pain crepitus or joint limitation noted with ROM b/l and hammertoes noted to the  2-5 bilaterally  Neurological: Protective sensation intact 5/5 intact bilaterally with 10g monofilament b/l. Babinski reflex negative b/l. Achilles reflex 2+ b/. Clonus negative b/l.  Assessment: 1. Pain due to onychomycosis of toenails of both feet   2. Pain in toes of both feet   3. Porokeratosis    Plan: -Toenails 1-5 b/l were debrided in length and girth with sterile nail nippers and dremel without iatrogenic bleeding.  -Painful porokeratotic lesion(s) submet head 3 right foot pared and enucleated with sterile  scalpel blade without incident. -Patient to continue soft, supportive shoe gear daily. -Patient to report any pedal injuries to medical professional immediately. -Patient/POA to call should there be question/concern in the interim.  Return in about 3 months (around 06/14/2020) for nail and callus trim.

## 2020-03-22 NOTE — Telephone Encounter (Signed)
I spoke with pt and she said that she does not think this is an emergent need; pt said she always has some swelling in lt lower leg but is worse than usual; no pain, redness or warmth to lower lt leg; pt does having a burning sensation inside of her lt lower leg. No CP or SOB. Pt has appt to see vein dr on 03/28/20 but pt scheduled in office appt to see Dr Diona Browner on 03/24/20 at 9 AM. UC & ED precautions given and pt voiced understanding.FYI to Dr Diona Browner.Pt has no covid symptoms, no travel and no known exposure to + covid.

## 2020-03-22 NOTE — Telephone Encounter (Signed)
Agree with plan 

## 2020-03-22 NOTE — Telephone Encounter (Signed)
-----   Message from [name], MD sent at 03/22/2020  2:07 PM EDT ----- Please see MyChart message... pt sent message yesterday with symptoms. Leg swelling.. can you review with her and help triage... my concern id possible DVT. Does she need to be seen today at ER/urgent care? Most likely if no CP and SOB can be seen tommorow  with someone or even Thursday with me.  Thanks,  Amy

## 2020-03-24 ENCOUNTER — Encounter: Payer: Self-pay | Admitting: Family Medicine

## 2020-03-24 ENCOUNTER — Ambulatory Visit (INDEPENDENT_AMBULATORY_CARE_PROVIDER_SITE_OTHER): Payer: Medicare Other | Admitting: Family Medicine

## 2020-03-24 ENCOUNTER — Other Ambulatory Visit: Payer: Self-pay

## 2020-03-24 ENCOUNTER — Telehealth: Payer: Self-pay | Admitting: Family Medicine

## 2020-03-24 ENCOUNTER — Ambulatory Visit
Admission: RE | Admit: 2020-03-24 | Discharge: 2020-03-24 | Disposition: A | Discharge status: Home / Self Care | Payer: Medicare Other | Source: Ambulatory Visit | Attending: Family Medicine | Admitting: Family Medicine

## 2020-03-24 VITALS — BP 146/76 | HR 80 | Temp 96.9°F | Ht 60.75 in | Wt 176.0 lb

## 2020-03-24 DIAGNOSIS — M7989 Other specified soft tissue disorders: Secondary | ICD-10-CM | POA: Insufficient documentation

## 2020-03-24 DIAGNOSIS — M79662 Pain in left lower leg: Secondary | ICD-10-CM | POA: Insufficient documentation

## 2020-03-24 DIAGNOSIS — I82442 Acute embolism and thrombosis of left tibial vein: Secondary | ICD-10-CM | POA: Diagnosis not present

## 2020-03-24 MED ORDER — RIVAROXABAN 15 MG PO TABS
15.0000 mg | ORAL_TABLET | Freq: Two times a day (BID) | ORAL | 0 refills | Status: DC
Start: 2020-03-24 — End: 2020-04-15

## 2020-03-24 NOTE — Progress Notes (Signed)
Chief Complaint  Patient presents with  . Leg swollen and burning sensation    History of Present Illness: HPI  79 year old female pt with history of  Chronic venous insufficiency,  Varicose veins, lumbar back pain with sciatica, HTN presents with worsening swelling in left leg and burning sensation in  Left lower leg.  In last week she noted worsening swelling in left lower leg, ankle and a new burning sensation in left foot on top of chronic swelling in legs from her varicose veins.  She has no swelling in right leg at this time.     No redness, no warmth.  She has chronic bilateral swelling.. usually come worse in left leg.  Np CP, no Sob.   She usually wears compression sock  She has started back at gym in last 3 week.. three times a week...  Prior she was inactive.  She called her vascular Dr. Thane Edu.  previously treated with sclerotherapy.   No family history of clotting disorder or DVT.   This visit occurred during the SARS-CoV-2 public health emergency.  Safety protocols were in place, including screening questions prior to the visit, additional usage of staff PPE, and extensive cleaning of exam room while observing appropriate contact time as indicated for disinfecting solutions.   COVID 19 screen:  No recent travel or known exposure to COVID19 The patient denies respiratory symptoms of COVID 19 at this time. The importance of social distancing was discussed today.     Review of Systems  Constitutional: Negative for chills and fever.  HENT: Negative for congestion and ear pain.   Eyes: Negative for pain and redness.  Respiratory: Negative for cough and shortness of breath.   Cardiovascular: Positive for leg swelling. Negative for chest pain and palpitations.  Gastrointestinal: Negative for abdominal pain, blood in stool, constipation, diarrhea, nausea and vomiting.  Genitourinary: Negative for dysuria.  Musculoskeletal: Positive for back pain. Negative for falls  and myalgias.       Chronic back pain, unchanged  Skin: Negative for rash.  Neurological: Negative for dizziness and sensory change.  Psychiatric/Behavioral: Negative for depression. The patient is not nervous/anxious.       Past Medical History:  Diagnosis Date  . 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 Allergy   . Arthritis   . Asthma   . Complication of anesthesia    stays sleepy  . Diverticulosis of colon   . Gallstones   . Hyperlipidemia   . Hypertension   . Osteoarthritis   . Symptomatic mammary hypertrophy     reports that she quit smoking about 46 years ago. Her smoking use included cigarettes. She quit after 15.00 years of use. She has never used smokeless tobacco. She reports that she does not drink alcohol or use drugs.   Current Outpatient Medications:  .  albuterol (VENTOLIN HFA) 108 (90 Base) MCG/ACT inhaler, Inhale 1-2 puffs into the lungs every 6 (six) hours as needed for shortness of breath (for use when in contact with dogs or cats.)., Disp: 6.7 g, Rfl: 0 .  amLODipine (NORVASC) 10 MG tablet, TAKE 1 TABLET BY MOUTH  DAILY, Disp: 90 tablet, Rfl: 3 .  atorvastatin (LIPITOR) 20 MG tablet, TAKE 1 TABLET BY MOUTH  DAILY, Disp: 90 tablet, Rfl: 3 .  cetirizine (ZYRTEC) 10 MG tablet, Take 10 mg by mouth daily as needed for allergies. Reported on 01/10/2016, Disp: , Rfl:  .  cholecalciferol (VITAMIN  D) 1000 UNITS tablet, Take 1,000 Units by mouth daily., Disp: , Rfl:  .  hydrochlorothiazide (MICROZIDE) 12.5 MG capsule, Take 1 capsule (12.5 mg total) by mouth daily., Disp: 90 capsule, Rfl: 3 .  potassium chloride SA (KLOR-CON) 20 MEQ tablet, potassium chloride ER 20 mEq tablet,extended release(part/cryst), Disp: , Rfl:  .  Zoster Vaccine Adjuvanted (SHINGRIX) injection, Shingrix (PF) 50 mcg/0.5 mL intramuscular suspension, kit, Disp: , Rfl:  .  amoxicillin (AMOXIL) 500 MG capsule, , Disp: , Rfl:    Observations/Objective: Blood pressure (!)  146/76, pulse 80, temperature (!) 96.9 F (36.1 C), temperature source Temporal, height 5' 0.75" (1.543 m), weight 176 lb (79.8 kg), SpO2 96 %.  Physical Exam Constitutional:      General: She is not in acute distress.    Appearance: Normal appearance. She is well-developed. She is not ill-appearing or toxic-appearing.  HENT:     Head: Normocephalic.     Right Ear: Hearing, tympanic membrane, ear canal and external ear normal. Tympanic membrane is not erythematous, retracted or bulging.     Left Ear: Hearing, tympanic membrane, ear canal and external ear normal. Tympanic membrane is not erythematous, retracted or bulging.     Nose: No mucosal edema or rhinorrhea.     Right Sinus: No maxillary sinus tenderness or frontal sinus tenderness.     Left Sinus: No maxillary sinus tenderness or frontal sinus tenderness.     Mouth/Throat:     Pharynx: Uvula midline.  Eyes:     General: Lids are normal. Lids are everted, no foreign bodies appreciated.     Conjunctiva/sclera: Conjunctivae normal.     Pupils: Pupils are equal, round, and reactive to light.  Neck:     Thyroid: No thyroid mass or thyromegaly.     Vascular: No carotid bruit.     Trachea: Trachea normal.  Cardiovascular:     Rate and Rhythm: Normal rate and regular rhythm.     Pulses: Normal pulses.          Dorsalis pedis pulses are 2+ on the right side and 2+ on the left side.       Posterior tibial pulses are 2+ on the right side and 2+ on the left side.     Heart sounds: Normal heart sounds, S1 normal and S2 normal. No murmur. No friction rub. No gallop.   Pulmonary:     Effort: Pulmonary effort is normal. No tachypnea or respiratory distress.     Breath sounds: Normal breath sounds. No decreased breath sounds, wheezing, rhonchi or rales.  Abdominal:     General: Bowel sounds are normal.     Palpations: Abdomen is soft.     Tenderness: There is no abdominal tenderness.  Musculoskeletal:     Cervical back: Normal range of  motion and neck supple.  Feet:     Right foot:     Skin integrity: Skin integrity normal.     Left foot:     Skin integrity: Skin integrity normal.     Comments: bilateral varicose veins Left leg  2 plus nonpitting edema  no swelling in  Right lower leg Skin:    General: Skin is warm and dry.     Findings: No rash.  Neurological:     Mental Status: She is alert.  Psychiatric:        Mood and Affect: Mood is not anxious or depressed.        Speech: Speech normal.  Behavior: Behavior normal. Behavior is cooperative.        Thought Content: Thought content normal.        Judgment: Judgment normal.     POSITIVE HOMAN"S SIGN ON LEFT LOWER LEG  Assessment and Plan   Pain and swelling of left lower leg Send for [name] to rule out DVT in left lower leg. If no DVT, pain is most liekly from worsening varicose veins and venous insufficiency.  Wear compression hose and elevate leg, can use ice or heat for pain relief. Keep follow up with vascular MD.     Eliezer Lofts, MD

## 2020-03-24 NOTE — Assessment & Plan Note (Signed)
Send for [name] to rule out DVT in left lower leg. If no DVT, pain is most liekly from worsening varicose veins and venous insufficiency.  Wear compression hose and elevate leg, can use ice or heat for pain relief. Keep follow up with vascular MD.

## 2020-03-24 NOTE — Telephone Encounter (Signed)
Distal DVT seen. Discussed results with patient. Given she has distal but symptomatic DVT with low HASBLED score ( 2)... will start on anticoagulation with Xarelto 15 mg BID  For 21 days. At that point will likely change to 20 mg for 3 months. She already has appt with vascular next week... will look to them for any change in recommendations.  Unprovoked DVT but  no history.  She will follow up  Here in 2 weeks.  We can consider need for coag work up etc at that time.   CC: Dr. Delana Meyer, vascular as Juluis Rainier

## 2020-03-24 NOTE — Patient Instructions (Signed)
We will call with [name] results.  Elevated leg, if no DVT wear compression hose and keep follow up with vas doctor as planned.cular

## 2020-03-28 ENCOUNTER — Ambulatory Visit (INDEPENDENT_AMBULATORY_CARE_PROVIDER_SITE_OTHER): Payer: Medicare Other | Admitting: Vascular Surgery

## 2020-03-28 ENCOUNTER — Other Ambulatory Visit: Payer: Self-pay

## 2020-03-28 ENCOUNTER — Encounter (INDEPENDENT_AMBULATORY_CARE_PROVIDER_SITE_OTHER): Payer: Self-pay | Admitting: Vascular Surgery

## 2020-03-28 VITALS — BP 136/83 | HR 91 | Ht 61.0 in | Wt 178.0 lb

## 2020-03-28 DIAGNOSIS — I82442 Acute embolism and thrombosis of left tibial vein: Secondary | ICD-10-CM

## 2020-03-28 DIAGNOSIS — I872 Venous insufficiency (chronic) (peripheral): Secondary | ICD-10-CM

## 2020-03-28 DIAGNOSIS — I890 Lymphedema, not elsewhere classified: Secondary | ICD-10-CM

## 2020-03-28 DIAGNOSIS — M5386 Other specified dorsopathies, lumbar region: Secondary | ICD-10-CM | POA: Diagnosis not present

## 2020-03-30 ENCOUNTER — Encounter (INDEPENDENT_AMBULATORY_CARE_PROVIDER_SITE_OTHER): Payer: Self-pay | Admitting: Vascular Surgery

## 2020-03-30 DIAGNOSIS — I82449 Acute embolism and thrombosis of unspecified tibial vein: Secondary | ICD-10-CM | POA: Insufficient documentation

## 2020-03-30 NOTE — Progress Notes (Signed)
MRN : [id number]  Tracy Dixon is a 79 y.o. (1941/05/23) female who presents with chief complaint of  Chief Complaint  Patient presents with  . Follow-up    BLE Swelling   .  History of Present Illness:   The patient presents to the office for evaluation of DVT.  DVT was identified by Duplex ultrasound in the left peroneal.  The initial symptoms were pain and swelling in the lower extremity.  The patient notes the leg continues to be very painful with dependency and swells quite a bite.  Symptoms are much better with elevation.  The patient notes minimal edema in the morning which steadily worsens throughout the day.    The patient has not been using compression therapy at this point.  No SOB or pleuritic chest pains.  No cough or hemoptysis.  No blood per rectum or blood in any sputum.  No excessive bruising per the patient.   Current Meds  Medication Sig  . albuterol (VENTOLIN HFA) 108 (90 Base) MCG/ACT inhaler Inhale 1-2 puffs into the lungs every 6 (six) hours as needed for shortness of breath (for use when in contact with dogs or cats.).  Marland Kitchen amLODipine (NORVASC) 10 MG tablet TAKE 1 TABLET BY MOUTH  DAILY  . atorvastatin (LIPITOR) 20 MG tablet TAKE 1 TABLET BY MOUTH  DAILY  . cholecalciferol (VITAMIN D) 1000 UNITS tablet Take 1,000 Units by mouth daily.  . hydrochlorothiazide (MICROZIDE) 12.5 MG capsule Take 1 capsule (12.5 mg total) by mouth daily.  . Rivaroxaban (XARELTO) 15 MG TABS tablet Take 1 tablet (15 mg total) by mouth 2 (two) times daily with a meal.  . Zoster Vaccine Adjuvanted (SHINGRIX) injection Shingrix (PF) 50 mcg/0.5 mL intramuscular suspension, kit    Past Medical History:  Diagnosis Date  . 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 Allergy   . Arthritis   . Asthma   . Complication of anesthesia    stays sleepy  . Diverticulosis of colon   . Gallstones   . Hyperlipidemia   . Hypertension   . Osteoarthritis   .  Symptomatic mammary hypertrophy     Past Surgical History:  Procedure Laterality Date  . Bilateral tubal ligation  2005  . BREAST REDUCTION SURGERY Bilateral 06/04/2018   Procedure: MAMMARY REDUCTION  (BREAST);  Surgeon: Wallace Going, DO;  Location: [facility];  Service: Plastics;  Laterality: Bilateral;  . Carotid doppler  07/2006   Neg  . CESAREAN SECTION  2005  . CHOLECYSTECTOMY  1982  . DECOMPRESSIVE LUMBAR LAMINECTOMY LEVEL 3  06/10/2013   Procedure: DECOMPRESSIVE LUMBAR LAMINECTOMY LEVEL 3;  Surgeon: Melina Schools, MD;  Location: [facility];  Service: Orthopedics;;  lumbar three to five decompression  . MRI brain  2007   acoustic neuroma (check yearly with MRI)  . PFT's  12/2006   (-) FEV1/FVC 79%, FEV1 84%  . REDUCTION MAMMAPLASTY Bilateral 05/2018  . REPLACEMENT TOTAL KNEE BILATERAL  2005  . TONSILLECTOMY  2005  . TUBAL LIGATION      Social History Social History   Tobacco Use  . Smoking status: Former Smoker    Years: 15.00    Types: Cigarettes    Quit date: 06/02/1973    Years since quitting: 46.8  . Smokeless tobacco: Never Used  . Tobacco comment: few cigs/day  Substance Use Topics  . Alcohol use: No  . Drug use: No  Family History Family History  Problem Relation Age of Onset  . Cancer Mother        Lung CA  . Stroke Mother        CVA  . Cancer Father        Prostate  . Heart disease Neg Hx   . Breast cancer Neg Hx   No family history of bleeding/clotting disorders, porphyria or autoimmune disease   Allergies  Allergen Reactions  . Contrast Media [Iodinated Diagnostic Agents] Hives  . Crab [Shellfish Allergy] Hives     REVIEW OF SYSTEMS (Negative unless checked)  Constitutional: '[]' Weight loss  '[]' Fever  '[]' Chills Cardiac: '[]' Chest pain   '[]' Chest pressure   '[]' Palpitations   '[]' Shortness of breath when laying flat   '[]' Shortness of breath with exertion. Vascular:  '[]' Pain in legs with walking   '[x]' Pain in legs at rest  '[x]' History of  DVT   '[]' Phlebitis   '[x]' Swelling in legs   '[]' Varicose veins   '[]' Non-healing ulcers Pulmonary:   '[]' Uses home oxygen   '[]' Productive cough   '[]' Hemoptysis   '[]' Wheeze  '[]' COPD   '[]' Asthma Neurologic:  '[]' Dizziness   '[]' Seizures   '[]' History of stroke   '[]' History of TIA  '[]' Aphasia   '[]' Vissual changes   '[]' Weakness or numbness in arm   '[]' Weakness or numbness in leg Musculoskeletal:   '[]' Joint swelling   '[x]' Joint pain   '[x]' Low back pain Hematologic:  '[]' Easy bruising  '[]' Easy bleeding   '[]' Hypercoagulable state   '[]' Anemic Gastrointestinal:  '[]' Diarrhea   '[]' Vomiting  '[]' Gastroesophageal reflux/heartburn   '[]' Difficulty swallowing. Genitourinary:  '[]' Chronic kidney disease   '[]' Difficult urination  '[]' Frequent urination   '[]' Blood in urine Skin:  '[x]' Rashes   '[]' Ulcers  Psychological:  '[]' History of anxiety   '[]'  History of major depression.  Physical Examination  Vitals:   03/28/20 1354  BP: 136/83  Pulse: 91  Weight: 178 lb (80.7 kg)  Height: '5\' 1"'  (1.549 m)   Body mass index is 33.63 kg/m. Gen: WD/WN, NAD Head: [place]/AT, No temporalis wasting.  Ear/Nose/Throat: Hearing grossly intact, nares w/o erythema or drainage, poor dentition Eyes: PER, EOMI, sclera nonicteric.  Neck: Supple, no masses.  No bruit or JVD.  Pulmonary:  Good air movement, clear to auscultation bilaterally, no use of accessory muscles.  Cardiac: RRR, normal S1, S2, no Murmurs. Vascular:  scattered varicosities present bilaterally.  Mild venous stasis changes to the legs bilaterally.  2+ soft pitting edema Vessel Right Left  Radial Palpable Palpable  PT Palpable Palpable  DP Palpable Palpable  Gastrointestinal: soft, non-distended. No guarding/no peritoneal signs.  Musculoskeletal: M/S 5/5 throughout.  No deformity or atrophy.  Neurologic: CN 2-12 intact. Pain and light touch intact in extremities.  Symmetrical.  Speech is fluent. Motor exam as listed above. Psychiatric: Judgment intact, Mood & affect appropriate for pt's clinical  situation. Dermatologic: mild venous rashes or ulcers noted.  No changes consistent with cellulitis. Lymph : No Cervical lymphadenopathy, no lichenification or skin changes of chronic lymphedema.  CBC Lab Results  Component Value Date   WBC 7.2 04/20/2019   HGB 13.6 04/20/2019   HCT 42.6 04/20/2019   MCV 78.5 (L) 04/20/2019   PLT 231 04/20/2019    BMET    Component Value Date/Time   NA 138 12/03/2019 0914   K 3.3 (L) 12/03/2019 0914   CL 100 12/03/2019 0914   CO2 31 12/03/2019 0914   GLUCOSE 121 (H) 12/03/2019 0914   BUN 15 12/03/2019 0914   CREATININE 0.81 12/03/2019 0914  CALCIUM 8.9 12/03/2019 0914   GFRNONAA >60 04/20/2019 1054   GFRAA >60 04/20/2019 1054   CrCl cannot be calculated (Patient's most recent lab result is older than the maximum 21 days allowed.).  COAG Lab Results  Component Value Date   INR 1.1 ratio (H) 03/30/2009    Radiology US Venous Img Lower Unilateral Left  Result Date: 03/24/2020 CLINICAL DATA:  79 year old female with a history of pain and swelling EXAM: LEFT LOWER EXTREMITY VENOUS DOPPLER ULTRASOUND TECHNIQUE: Gray-scale sonography with graded compression, as well as color Doppler and duplex ultrasound were performed to evaluate the lower extremity deep venous systems from the level of the common femoral vein and including the common femoral, femoral, profunda femoral, popliteal and calf veins including the posterior tibial, peroneal and gastrocnemius veins when visible. The superficial great saphenous vein was also interrogated. Spectral Doppler was utilized to evaluate flow at rest and with distal augmentation maneuvers in the common femoral, femoral and popliteal veins. COMPARISON:  None. FINDINGS: Contralateral Common Femoral Vein: Respiratory phasicity is normal and symmetric with the symptomatic side. No evidence of thrombus. Normal compressibility. Common Femoral Vein: No evidence of thrombus. Normal compressibility, respiratory phasicity  and response to augmentation. Saphenofemoral Junction: No evidence of thrombus. Normal compressibility and flow on color Doppler imaging. Profunda Femoral Vein: No evidence of thrombus. Normal compressibility and flow on color Doppler imaging. Femoral Vein: No evidence of thrombus. Normal compressibility, respiratory phasicity and response to augmentation. Popliteal Vein: No evidence of thrombus. Normal compressibility, respiratory phasicity and response to augmentation. Calf Veins: Posterior tibial vein patent. Evidence of occlusive thrombus of 1 of the matched peroneal vein with incomplete compressibility and no flow. Superficial Great Saphenous Vein: No evidence of thrombus. Normal compressibility and flow on color Doppler imaging. Other Findings:  None. IMPRESSION: Sonographic survey of the left lower extremity negative for DVT of the common femoral, femoral, popliteal veins. Study is positive for tibial DVT involving 1 of the peroneal veins. Electronically Signed   By: Corrie Mckusick D.O.   On: 03/24/2020 12:14     Assessment/Plan 1. Acute deep vein thrombosis (DVT) of tibial vein of left lower extremity (HCC) Recommend:   No surgery or intervention at this point in time.  IVC filter is not indicated at present.  Patient's duplex ultrasound of the venous system shows DVT in the distal peroneal vein.  I would plan to repeat her scan as this is tibial clot not more proximal.  It may be that a short period of anticoagulation will be appropriate.  The patient is initiated on anticoagulation   Elevation was stressed, use of a recliner was discussed.  I have had a long discussion with the patient regarding DVT and post phlebitic changes such as swelling and why it  causes symptoms such as pain.  The patient will wear graduated compression stockings, beginning after three full days of anticoagulation, on a daily basis a prescription was given. The patient will  beginning wearing the stockings first  thing in the morning and removing them in the evening. The patient is instructed specifically not to sleep in the stockings.  In addition, behavioral modification including elevation during the day and avoidance of prolonged dependency will be initiated.    The patient will continue anticoagulation for now as there have not been any problems or complications at this point.   - VAS [name] LOWER EXTREMITY VENOUS (DVT); Future  2. Chronic venous insufficiency No surgery or intervention at this point in time.  I have had a long discussion with the patient regarding venous insufficiency and why it  causes symptoms. I have discussed with the patient the chronic skin changes that accompany venous insufficiency and the long term sequela such as infection and ulceration.  Patient will begin wearing graduated compression stockings on a daily basis a prescription was given. The patient will put the stockings on first thing in the morning and removing them in the evening. The patient is instructed specifically not to sleep in the stockings.    In addition, behavioral modification including several periods of elevation of the lower extremities during the day will be continued. I have demonstrated that proper elevation is a position with the ankles at heart level.  The patient is instructed to begin routine exercise, especially walking on a daily basis  Patient should undergo follow-up duplex ultrasound of the venous system to ensure that DVT has not propagated.  Following the review of the ultrasound the patient will follow up in 2-3 months to reassess the degree of swelling and the control that graduated compression stockings or compression wraps  is offering.   The patient can be assessed for a Lymph Pump at that time  3. Lymphedema See #1 and 2  4. Sciatica of left side associated with disorder of lumbar spine I believe this is the cause of the pain that is radiating down her leg.  I do not feel that her  description of the symptoms would be consistent with distal tibial clot.  Continue medications as already ordered, these medications have been reviewed and there are no changes at this time.  Continued activity and therapy was stressed.    Hortencia Pilar, MD  03/30/2020 7:58 AM

## 2020-04-04 ENCOUNTER — Encounter (INDEPENDENT_AMBULATORY_CARE_PROVIDER_SITE_OTHER): Payer: Self-pay | Admitting: Vascular Surgery

## 2020-04-04 ENCOUNTER — Ambulatory Visit (INDEPENDENT_AMBULATORY_CARE_PROVIDER_SITE_OTHER): Payer: Medicare Other

## 2020-04-04 ENCOUNTER — Other Ambulatory Visit: Payer: Self-pay

## 2020-04-04 ENCOUNTER — Ambulatory Visit (INDEPENDENT_AMBULATORY_CARE_PROVIDER_SITE_OTHER): Payer: Medicare Other | Admitting: Vascular Surgery

## 2020-04-04 VITALS — BP 128/78 | HR 96 | Resp 16 | Wt 177.0 lb

## 2020-04-04 DIAGNOSIS — I872 Venous insufficiency (chronic) (peripheral): Secondary | ICD-10-CM

## 2020-04-04 DIAGNOSIS — E7800 Pure hypercholesterolemia, unspecified: Secondary | ICD-10-CM | POA: Diagnosis not present

## 2020-04-04 DIAGNOSIS — I10 Essential (primary) hypertension: Secondary | ICD-10-CM

## 2020-04-04 DIAGNOSIS — I82442 Acute embolism and thrombosis of left tibial vein: Secondary | ICD-10-CM

## 2020-04-04 DIAGNOSIS — M8949 Other hypertrophic osteoarthropathy, multiple sites: Secondary | ICD-10-CM | POA: Diagnosis not present

## 2020-04-04 DIAGNOSIS — M159 Polyosteoarthritis, unspecified: Secondary | ICD-10-CM

## 2020-04-04 NOTE — Progress Notes (Signed)
MRN : [id number]  Tracy Dixon is a 79 y.o. (1941-08-11) female who presents with chief complaint of  Chief Complaint  Patient presents with  . Follow-up    ultrasound follow up  .  History of Present Illness:    The patient presents to the office for evaluation of DVT.  DVT was identified at [facility] by Duplex ultrasound.  The initial symptoms were pain and swelling in the lower extremity.  The patient notes the symptoms are much better.  The patient notes minimal edema in the morning.    The patient has not been using compression therapy at this point.  No SOB or pleuritic chest pains.  No cough or hemoptysis.  No blood per rectum or blood in any sputum.  No excessive bruising per the patient.  Duplex ultrasound shows the peroneal DVT is not present, no other venous abnormality identified    Current Meds  Medication Sig  . albuterol (VENTOLIN HFA) 108 (90 Base) MCG/ACT inhaler Inhale 1-2 puffs into the lungs every 6 (six) hours as needed for shortness of breath (for use when in contact with dogs or cats.).  Marland Kitchen amLODipine (NORVASC) 10 MG tablet TAKE 1 TABLET BY MOUTH  DAILY  . amoxicillin (AMOXIL) 500 MG capsule   . atorvastatin (LIPITOR) 20 MG tablet TAKE 1 TABLET BY MOUTH  DAILY  . cetirizine (ZYRTEC) 10 MG tablet Take 10 mg by mouth daily as needed for allergies. Reported on 01/10/2016  . cholecalciferol (VITAMIN D) 1000 UNITS tablet Take 1,000 Units by mouth daily.  . hydrochlorothiazide (MICROZIDE) 12.5 MG capsule Take 1 capsule (12.5 mg total) by mouth daily.  . potassium chloride SA (KLOR-CON) 20 MEQ tablet potassium chloride ER 20 mEq tablet,extended release(part/cryst)  . Rivaroxaban (XARELTO) 15 MG TABS tablet Take 1 tablet (15 mg total) by mouth 2 (two) times daily with a meal.  . Zoster Vaccine Adjuvanted (SHINGRIX) injection Shingrix (PF) 50 mcg/0.5 mL intramuscular suspension, kit    Past Medical History:  Diagnosis Date  . 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 Allergy   . Arthritis   . Asthma   . Complication of anesthesia    stays sleepy  . Diverticulosis of colon   . Gallstones   . Hyperlipidemia   . Hypertension   . Osteoarthritis   . Symptomatic mammary hypertrophy     Past Surgical History:  Procedure Laterality Date  . Bilateral tubal ligation  2005  . BREAST REDUCTION SURGERY Bilateral 06/04/2018   Procedure: MAMMARY REDUCTION  (BREAST);  Surgeon: Wallace Going, DO;  Location: [place];  Service: Plastics;  Laterality: Bilateral;  . Carotid doppler  07/2006   Neg  . CESAREAN SECTION  2005  . CHOLECYSTECTOMY  1982  . DECOMPRESSIVE LUMBAR LAMINECTOMY LEVEL 3  06/10/2013   Procedure: DECOMPRESSIVE LUMBAR LAMINECTOMY LEVEL 3;  Surgeon: Melina Schools, MD;  Location: [place];  Service: Orthopedics;;  lumbar three to five decompression  . MRI brain  2007   acoustic neuroma (check yearly with MRI)  . PFT's  12/2006   (-) FEV1/FVC 79%, FEV1 84%  . REDUCTION MAMMAPLASTY Bilateral 05/2018  . REPLACEMENT TOTAL KNEE BILATERAL  2005  . TONSILLECTOMY  2005  . TUBAL LIGATION      Social History Social History   Tobacco Use  . Smoking status: Former Smoker    Years: 15.00    Types: Cigarettes    Quit date:  06/02/1973    Years since quitting: 46.8  . Smokeless tobacco: Never Used  . Tobacco comment: few cigs/day  Substance Use Topics  . Alcohol use: No  . Drug use: No    Family History Family History  Problem Relation Age of Onset  . Cancer Mother        Lung CA  . Stroke Mother        CVA  . Cancer Father        Prostate  . Heart disease Neg Hx   . Breast cancer Neg Hx     Allergies  Allergen Reactions  . Contrast Media [Iodinated Diagnostic Agents] Hives  . Crab [Shellfish Allergy] Hives     REVIEW OF SYSTEMS (Negative unless checked)  Constitutional: '[]' Weight loss  '[]' Fever  '[]' Chills Cardiac: '[]' Chest pain   '[]' Chest pressure   '[]' Palpitations   '[]' Shortness of  breath when laying flat   '[]' Shortness of breath with exertion. Vascular:  '[]' Pain in legs with walking   '[]' Pain in legs at rest  '[x]' History of DVT   '[]' Phlebitis   '[x]' Swelling in legs   '[]' Varicose veins   '[]' Non-healing ulcers Pulmonary:   '[]' Uses home oxygen   '[]' Productive cough   '[]' Hemoptysis   '[]' Wheeze  '[]' COPD   '[]' Asthma Neurologic:  '[]' Dizziness   '[]' Seizures   '[]' History of stroke   '[]' History of TIA  '[]' Aphasia   '[]' Vissual changes   '[]' Weakness or numbness in arm   '[]' Weakness or numbness in leg Musculoskeletal:   '[]' Joint swelling   '[]' Joint pain   '[]' Low back pain Hematologic:  '[]' Easy bruising  '[]' Easy bleeding   '[]' Hypercoagulable state   '[]' Anemic Gastrointestinal:  '[]' Diarrhea   '[]' Vomiting  '[]' Gastroesophageal reflux/heartburn   '[]' Difficulty swallowing. Genitourinary:  '[]' Chronic kidney disease   '[]' Difficult urination  '[]' Frequent urination   '[]' Blood in urine Skin:  '[]' Rashes   '[]' Ulcers  Psychological:  '[]' History of anxiety   '[]'  History of major depression.  Physical Examination  Vitals:   04/04/20 0841  BP: 128/78  Pulse: 96  Resp: 16  Weight: 177 lb (80.3 kg)   Body mass index is 33.44 kg/m. Gen: WD/WN, NAD Head: [place]/AT, No temporalis wasting.  Ear/Nose/Throat: Hearing grossly intact, nares w/o erythema or drainage Eyes: PER, EOMI, sclera nonicteric.  Neck: Supple, no large masses.   Pulmonary:  Good air movement, no audible wheezing bilaterally, no use of accessory muscles.  Cardiac: RRR, no JVD Vascular: scattered varicosities present bilaterally.  Mild venous stasis changes to the legs bilaterally.  trace soft pitting edema Gastrointestinal: Non-distended. No guarding/no peritoneal signs.  Musculoskeletal: M/S 5/5 throughout.  No deformity or atrophy.  Neurologic: CN 2-12 intact. Symmetrical.  Speech is fluent. Motor exam as listed above. Psychiatric: Judgment intact, Mood & affect appropriate for pt's clinical situation. Dermatologic: mild venous rashes no ulcers noted.  No changes  consistent with cellulitis. Lymph : No lichenification or skin changes of chronic lymphedema.  CBC Lab Results  Component Value Date   WBC 7.2 04/20/2019   HGB 13.6 04/20/2019   HCT 42.6 04/20/2019   MCV 78.5 (L) 04/20/2019   PLT 231 04/20/2019    BMET    Component Value Date/Time   NA 138 12/03/2019 0914   K 3.3 (L) 12/03/2019 0914   CL 100 12/03/2019 0914   CO2 31 12/03/2019 0914   GLUCOSE 121 (H) 12/03/2019 0914   BUN 15 12/03/2019 0914   CREATININE 0.81 12/03/2019 0914   CALCIUM 8.9 12/03/2019 0914   GFRNONAA >60 04/20/2019 1054  GFRAA >60 04/20/2019 1054   CrCl cannot be calculated (Patient's most recent lab result is older than the maximum 21 days allowed.).  COAG Lab Results  Component Value Date   INR 1.1 ratio (H) 03/30/2009    Radiology US Venous Img Lower Unilateral Left  Result Date: 03/24/2020 CLINICAL DATA:  79 year old female with a history of pain and swelling EXAM: LEFT LOWER EXTREMITY VENOUS DOPPLER ULTRASOUND TECHNIQUE: Gray-scale sonography with graded compression, as well as color Doppler and duplex ultrasound were performed to evaluate the lower extremity deep venous systems from the level of the common femoral vein and including the common femoral, femoral, profunda femoral, popliteal and calf veins including the posterior tibial, peroneal and gastrocnemius veins when visible. The superficial great saphenous vein was also interrogated. Spectral Doppler was utilized to evaluate flow at rest and with distal augmentation maneuvers in the common femoral, femoral and popliteal veins. COMPARISON:  None. FINDINGS: Contralateral Common Femoral Vein: Respiratory phasicity is normal and symmetric with the symptomatic side. No evidence of thrombus. Normal compressibility. Common Femoral Vein: No evidence of thrombus. Normal compressibility, respiratory phasicity and response to augmentation. Saphenofemoral Junction: No evidence of thrombus. Normal compressibility  and flow on color Doppler imaging. Profunda Femoral Vein: No evidence of thrombus. Normal compressibility and flow on color Doppler imaging. Femoral Vein: No evidence of thrombus. Normal compressibility, respiratory phasicity and response to augmentation. Popliteal Vein: No evidence of thrombus. Normal compressibility, respiratory phasicity and response to augmentation. Calf Veins: Posterior tibial vein patent. Evidence of occlusive thrombus of 1 of the matched peroneal vein with incomplete compressibility and no flow. Superficial Great Saphenous Vein: No evidence of thrombus. Normal compressibility and flow on color Doppler imaging. Other Findings:  None. IMPRESSION: Sonographic survey of the left lower extremity negative for DVT of the common femoral, femoral, popliteal veins. Study is positive for tibial DVT involving 1 of the peroneal veins. Electronically Signed   By: Corrie Mckusick D.O.   On: 03/24/2020 12:14      Assessment/Plan 1. Acute deep vein thrombosis (DVT) of tibial vein of left lower extremity (HCC) Peroneal DVT is no longer present and therefore she can stop the Xarelto  Continue compression on a daily basis  Follow up PRN  2. Chronic venous insufficiency No surgery or intervention at this point in time.    I have had a long discussion with the patient regarding venous insufficiency and why it  causes symptoms. I have discussed with the patient the chronic skin changes that accompany venous insufficiency and the long term sequela such as infection and ulceration.  Patient will begin wearing graduated compression stockings class 1 (20-30 mmHg) or compression wraps on a daily basis a prescription was given. The patient will put the stockings on first thing in the morning and removing them in the evening. The patient is instructed specifically not to sleep in the stockings.    In addition, behavioral modification including several periods of elevation of the lower extremities during  the day will be continued. I have demonstrated that proper elevation is a position with the ankles at heart level.  The patient is instructed to begin routine exercise, especially walking on a daily basis  3. Essential hypertension, benign Continue antihypertensive medications as already ordered, these medications have been reviewed and there are no changes at this time.   4. Primary osteoarthritis involving multiple joints Continue NSAID medications as already ordered, these medications have been reviewed and there are no changes at this time.  Continued  activity and therapy was stressed.   5. High cholesterol Continue statin as ordered and reviewed, no changes at this time    Hortencia Pilar, MD  04/04/2020 8:54 AM

## 2020-04-07 ENCOUNTER — Ambulatory Visit: Payer: Medicare Other | Admitting: Family Medicine

## 2020-04-15 ENCOUNTER — Ambulatory Visit (INDEPENDENT_AMBULATORY_CARE_PROVIDER_SITE_OTHER): Payer: Medicare Other | Admitting: Plastic Surgery

## 2020-04-15 ENCOUNTER — Other Ambulatory Visit: Payer: Self-pay

## 2020-04-15 ENCOUNTER — Encounter: Payer: Self-pay | Admitting: Plastic Surgery

## 2020-04-15 VITALS — BP 129/75 | HR 91 | Temp 97.5°F | Ht 61.0 in | Wt 176.0 lb

## 2020-04-15 DIAGNOSIS — N641 Fat necrosis of breast: Secondary | ICD-10-CM | POA: Diagnosis not present

## 2020-04-15 DIAGNOSIS — Z9889 Other specified postprocedural states: Secondary | ICD-10-CM

## 2020-04-15 NOTE — Progress Notes (Signed)
Patient ID: Tracy Dixon, female    DOB: Mar 22, 1941, 79 y.o.   MRN: [id number]   Chief Complaint  Patient presents with  . Follow-up    1 year for (B) breast reduction  . Breast Problem    The patient is a 79 year old female here for follow-up on her bilateral breast reduction.  She is an extremely and very appreciative of her surgery.  She had bilateral breast reductions in July 2019.  She was seen in May 2020.  In February 2021 she had some concerns about a little bit of tenderness and mass in her breast.  She underwent ultrasound.  Fortunately there were no areas of concern.  She is relieved about this.  Most likely it was fat necrosis.  She did a little bit of massage and I think that made a big difference.  She has no concerning lumps or bumps today.  There is no tenderness to speak of today and no signs of infection.  All her incisions are healing beautifully.  She has done an amazing job with her recovery.  She is 5 feet 1 inches tall and weighs 176 pounds.  Rheumatoid arthritis for which she is being treated.  She is not planning on having any additional spine surgery.  She was scheduled for spine injection but with Covid she decided to wait.   Review of Systems  Constitutional: Positive for activity change.  HENT: Negative.   Eyes: Negative.   Respiratory: Negative.   Cardiovascular: Negative.   Gastrointestinal: Negative.   Genitourinary: Negative.   Musculoskeletal: Positive for back pain.  Hematological: Negative.   Psychiatric/Behavioral: Negative.     Past Medical History:  Diagnosis Date  . 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 Allergy   . Arthritis   . Asthma   . Complication of anesthesia    stays sleepy  . Diverticulosis of colon   . Gallstones   . Hyperlipidemia   . Hypertension   . Osteoarthritis   . Symptomatic mammary hypertrophy     Past Surgical History:  Procedure Laterality Date  . Bilateral tubal ligation   2005  . BREAST REDUCTION SURGERY Bilateral 06/04/2018   Procedure: MAMMARY REDUCTION  (BREAST);  Surgeon: Wallace Going, DO;  Location: [place];  Service: Plastics;  Laterality: Bilateral;  . Carotid doppler  07/2006   Neg  . CESAREAN SECTION  2005  . CHOLECYSTECTOMY  1982  . DECOMPRESSIVE LUMBAR LAMINECTOMY LEVEL 3  06/10/2013   Procedure: DECOMPRESSIVE LUMBAR LAMINECTOMY LEVEL 3;  Surgeon: Melina Schools, MD;  Location: [place];  Service: Orthopedics;;  lumbar three to five decompression  . MRI brain  2007   acoustic neuroma (check yearly with MRI)  . PFT's  12/2006   (-) FEV1/FVC 79%, FEV1 84%  . REDUCTION MAMMAPLASTY Bilateral 05/2018  . REPLACEMENT TOTAL KNEE BILATERAL  2005  . TONSILLECTOMY  2005  . TUBAL LIGATION        Current Outpatient Medications:  .  albuterol (VENTOLIN HFA) 108 (90 Base) MCG/ACT inhaler, Inhale 1-2 puffs into the lungs every 6 (six) hours as needed for shortness of breath (for use when in contact with dogs or cats.)., Disp: 6.7 g, Rfl: 0 .  amLODipine (NORVASC) 10 MG tablet, TAKE 1 TABLET BY MOUTH  DAILY, Disp: 90 tablet, Rfl: 3 .  atorvastatin (LIPITOR) 20 MG tablet, TAKE 1 TABLET BY MOUTH  DAILY, Disp: 90 tablet, Rfl:  3 .  cetirizine (ZYRTEC) 10 MG tablet, Take 10 mg by mouth daily as needed for allergies. Reported on 01/10/2016, Disp: , Rfl:  .  cholecalciferol (VITAMIN D) 1000 UNITS tablet, Take 1,000 Units by mouth daily., Disp: , Rfl:  .  hydrochlorothiazide (MICROZIDE) 12.5 MG capsule, Take 1 capsule (12.5 mg total) by mouth daily., Disp: 90 capsule, Rfl: 3 .  Zoster Vaccine Adjuvanted [facility]) injection, Shingrix (PF) 50 mcg/0.5 mL intramuscular suspension, kit, Disp: , Rfl:    Objective:   Vitals:   04/15/20 0900  BP: 129/75  Pulse: 91  Temp: (!) 97.5 F (36.4 C)  SpO2: 98%    Physical Exam Vitals and nursing note reviewed.  Constitutional:      Appearance: Normal appearance.  HENT:     Head: Normocephalic and  atraumatic.  Eyes:     Extraocular Movements: Extraocular movements intact.  Cardiovascular:     Rate and Rhythm: Normal rate.     Pulses: Normal pulses.  Pulmonary:     Effort: Pulmonary effort is normal.  Abdominal:     General: Abdomen is flat. There is no distension.     Tenderness: There is no abdominal tenderness.  Skin:    General: Skin is warm.     Capillary Refill: Capillary refill takes less than 2 seconds.  Neurological:     General: No focal deficit present.     Mental Status: She is alert. Mental status is at baseline.  Psychiatric:        Mood and Affect: Mood normal.        Behavior: Behavior normal.        Thought Content: Thought content normal.     Assessment & Plan:  Fat necrosis of breast  History of bilateral breast reduction surgery  It was great to see Ms. Clark again.  Follow-up in 1 year or as needed.  She knows if she has any questions or concerns she should call us.  Continue to massage any areas that become firm.  If they do not respond then we want to know about them. Pictures were obtained of the patient and placed in the chart with the patient's or guardian's permission.  [place]illo, DO

## 2020-04-18 ENCOUNTER — Encounter: Payer: Self-pay | Admitting: Plastic Surgery

## 2020-05-09 DIAGNOSIS — H2511 Age-related nuclear cataract, right eye: Secondary | ICD-10-CM | POA: Diagnosis not present

## 2020-06-14 ENCOUNTER — Ambulatory Visit: Payer: Medicare Other | Admitting: Podiatry

## 2020-06-28 MED ORDER — HYDROCHLOROTHIAZIDE 12.5 MG PO CAPS: 12.5000 mg | ORAL_CAPSULE | Freq: Every day | ORAL | 1 refills | Status: DC

## 2020-07-18 ENCOUNTER — Ambulatory Visit: Payer: Medicare Other | Attending: Internal Medicine

## 2020-07-18 DIAGNOSIS — Z23 Encounter for immunization: Secondary | ICD-10-CM

## 2020-07-18 NOTE — Progress Notes (Signed)
   Covid-19 Vaccination Clinic  Name:  XEE HOLLMAN    MRN: [id number] DOB: Mar 08, 1941  07/18/2020  Ms. Moehle was observed post Covid-19 immunization for 15 minutes without incident. She was provided with Vaccine Information Sheet and instruction to access the V-Safe system.   Ms. Allbritton was instructed to call 911 with any severe reactions post vaccine: Marland Kitchen Difficulty breathing  . Swelling of face and throat  . A fast heartbeat  . A bad rash all over body  . Dizziness and weakness   Immunizations Administered    Name Date Dose VIS Date Route   Pfizer COVID-19 Vaccine 07/18/2020 10:40 AM 0.3 mL 01/20/2019 Intramuscular   Manufacturer: [employer], [employer]   Lot: [id number]   Anthony: [id number]

## 2020-07-21 ENCOUNTER — Encounter: Payer: Self-pay | Admitting: *Deleted

## 2020-07-21 ENCOUNTER — Telehealth: Payer: Self-pay

## 2020-07-21 NOTE — Telephone Encounter (Signed)
Okay to write letter: She does not need pre or post procedure antibiotic prophylaxis

## 2020-07-21 NOTE — Telephone Encounter (Signed)
Sharyn Lull with [facility] left v/m that pt was seen today for cleaning and hygienist wants to know if pt needs pre or post med prior to or following dental procedures including cleaning. Request a letter faxed to [phone] to make sure pt does or does not need pre and post med for any dental procedures.

## 2020-07-21 NOTE — Telephone Encounter (Signed)
Letter written and faxed to Dr. Corky Sing office at [phone], as instructed by Dr. Diona Browner.

## 2020-09-03 ENCOUNTER — Ambulatory Visit (INDEPENDENT_AMBULATORY_CARE_PROVIDER_SITE_OTHER): Payer: Medicare Other

## 2020-09-03 ENCOUNTER — Other Ambulatory Visit: Payer: Self-pay

## 2020-09-03 DIAGNOSIS — Z23 Encounter for immunization: Secondary | ICD-10-CM

## 2020-09-14 ENCOUNTER — Other Ambulatory Visit: Payer: Self-pay

## 2020-09-14 ENCOUNTER — Ambulatory Visit (INDEPENDENT_AMBULATORY_CARE_PROVIDER_SITE_OTHER): Payer: Medicare Other | Admitting: Podiatry

## 2020-09-14 ENCOUNTER — Encounter: Payer: Self-pay | Admitting: Podiatry

## 2020-09-14 DIAGNOSIS — M79671 Pain in right foot: Secondary | ICD-10-CM

## 2020-09-14 DIAGNOSIS — Q828 Other specified congenital malformations of skin: Secondary | ICD-10-CM

## 2020-09-14 DIAGNOSIS — M79674 Pain in right toe(s): Secondary | ICD-10-CM

## 2020-09-14 DIAGNOSIS — B351 Tinea unguium: Secondary | ICD-10-CM

## 2020-09-14 DIAGNOSIS — M79675 Pain in left toe(s): Secondary | ICD-10-CM | POA: Diagnosis not present

## 2020-09-18 NOTE — Progress Notes (Signed)
Subjective: Tracy Dixon presents today for follow up of painful porokeratotic lesion(s) submet head 3 right and painful mycotic toenails b/l that limit ambulation. Aggravating factors include weightbearing with and without shoe gear. Pain for both is relieved with periodic professional debridement.   She voices no new pedal problems on today's visit.  Allergies  Allergen Reactions  . Contrast Media [Iodinated Diagnostic Agents] Hives  . Crab [Shellfish Allergy] Hives     Objective: There were no vitals filed for this visit.   Pt is a pleasant 79 y.o. year old AA female in NAD. AAO x 3.   Vascular Examination:  Capillary refill time to digits immediate b/l. Palpable DP pulses b/l. Palpable PT pulses b/l. Pedal hair present b/l. Skin temperature gradient within normal limits b/l. Varicosities present b/l.  Dermatological Examination: Pedal skin with normal turgor, texture and tone bilaterally. No open wounds bilaterally. No interdigital macerations bilaterally. Toenails 1-5 b/l elongated, dystrophic, thickened, crumbly with subungual debris and tenderness to dorsal palpation. Porokeratotic lesion(s) submet head 3 right foot. No erythema, no edema, no drainage, no flocculence.  Musculoskeletal: Normal muscle strength 5/5 to all lower extremity muscle groups bilaterally, no pain crepitus or joint limitation noted with ROM b/l and hammertoes noted to the  2-5 bilaterally  Neurological: Protective sensation intact 5/5 intact bilaterally with 10g monofilament b/l. Babinski reflex negative b/l. Achilles reflex 2+ b/. Clonus negative b/l.  Assessment: 1. Pain due to onychomycosis of toenails of both feet   2. Porokeratosis   3. Right foot pain    Plan: -Examined patient. -No new findings. No new orders. -Medicare ABN signed today. Copy given to patient on today's visit and copy placed in patient's chart. -Toenails 1-5 b/l were debrided in length and girth with sterile nail nippers and  dremel without iatrogenic bleeding.  -Painful porokeratotic lesion(s) submet head 3 right foot pared and enucleated with sterile scalpel blade without incident. -Patient to report any pedal injuries to medical professional immediately. -Patient to continue soft, supportive shoe gear daily. -Patient/POA to call should there be question/concern in the interim.  Return in about 3 months (around 12/15/2020).

## 2020-11-15 ENCOUNTER — Other Ambulatory Visit: Payer: Self-pay | Admitting: Family Medicine

## 2020-11-30 ENCOUNTER — Telehealth: Payer: Self-pay | Admitting: Family Medicine

## 2020-11-30 DIAGNOSIS — Z1159 Encounter for screening for other viral diseases: Secondary | ICD-10-CM

## 2020-11-30 DIAGNOSIS — E7800 Pure hypercholesterolemia, unspecified: Secondary | ICD-10-CM

## 2020-11-30 DIAGNOSIS — E559 Vitamin D deficiency, unspecified: Secondary | ICD-10-CM

## 2020-11-30 NOTE — Telephone Encounter (Signed)
-----   Message from Aquilla Solian, RT sent at 11/21/2020  1:41 PM EST ----- Regarding: Lab Orders for Monday 1.10.2022 Please place lab orders for Monday 1.10.2022, office visit for physical on Tuesday 1.18.2022 Thank you, Jones Bales RT(R)

## 2020-12-05 ENCOUNTER — Other Ambulatory Visit (INDEPENDENT_AMBULATORY_CARE_PROVIDER_SITE_OTHER): Payer: Medicare Other

## 2020-12-05 ENCOUNTER — Ambulatory Visit: Payer: Medicare Other

## 2020-12-05 ENCOUNTER — Other Ambulatory Visit: Payer: Self-pay

## 2020-12-05 DIAGNOSIS — E7800 Pure hypercholesterolemia, unspecified: Secondary | ICD-10-CM

## 2020-12-05 DIAGNOSIS — Z1159 Encounter for screening for other viral diseases: Secondary | ICD-10-CM

## 2020-12-05 DIAGNOSIS — E559 Vitamin D deficiency, unspecified: Secondary | ICD-10-CM | POA: Diagnosis not present

## 2020-12-05 LAB — COMPREHENSIVE METABOLIC PANEL
ALT: 12 U/L (ref 0–35)
AST: 15 U/L (ref 0–37)
Albumin: 3.9 g/dL (ref 3.5–5.2)
Alkaline Phosphatase: 54 U/L (ref 39–117)
BUN: 19 mg/dL (ref 6–23)
CO2: 30 mEq/L (ref 19–32)
Calcium: 9.5 mg/dL (ref 8.4–10.5)
Chloride: 101 mEq/L (ref 96–112)
Creatinine, Ser: 0.75 mg/dL (ref 0.40–1.20)
GFR: 75.55 mL/min (ref >60.00)
Glucose, Bld: 76 mg/dL (ref 70–99)
Potassium: 4.1 mEq/L (ref 3.5–5.1)
Sodium: 136 mEq/L (ref 135–145)
Total Bilirubin: 0.4 mg/dL (ref 0.2–1.2)
Total Protein: 7.9 g/dL (ref 6.0–8.3)

## 2020-12-05 LAB — LIPID PANEL
Cholesterol: 134 mg/dL (ref 0–200)
HDL: 44.2 mg/dL (ref >39.00)
LDL Cholesterol: 78 mg/dL (ref 0–99)
NonHDL: 90.22
Total CHOL/HDL Ratio: 3
Triglycerides: 60 mg/dL (ref 0.0–149.0)
VLDL: 12 mg/dL (ref 0.0–40.0)

## 2020-12-05 LAB — VITAMIN D 25 HYDROXY (VIT D DEFICIENCY, FRACTURES): VITD: 78.14 ng/mL (ref 30.00–100.00)

## 2020-12-05 NOTE — Progress Notes (Signed)
No critical labs need to be addressed urgently. We will discuss labs in detail at upcoming office visit.   

## 2020-12-06 LAB — HEPATITIS C ANTIBODY
Hepatitis C Ab: NONREACTIVE
SIGNAL TO CUT-OFF: 0.01 (ref <1.00)

## 2020-12-13 ENCOUNTER — Telehealth (INDEPENDENT_AMBULATORY_CARE_PROVIDER_SITE_OTHER): Payer: Medicare Other | Admitting: Family Medicine

## 2020-12-13 ENCOUNTER — Encounter: Payer: Self-pay | Admitting: Family Medicine

## 2020-12-13 VITALS — BP 127/67 | HR 93

## 2020-12-13 DIAGNOSIS — R7303 Prediabetes: Secondary | ICD-10-CM | POA: Diagnosis not present

## 2020-12-13 DIAGNOSIS — M79662 Pain in left lower leg: Secondary | ICD-10-CM | POA: Diagnosis not present

## 2020-12-13 DIAGNOSIS — E559 Vitamin D deficiency, unspecified: Secondary | ICD-10-CM | POA: Diagnosis not present

## 2020-12-13 DIAGNOSIS — E7800 Pure hypercholesterolemia, unspecified: Secondary | ICD-10-CM

## 2020-12-13 DIAGNOSIS — M7989 Other specified soft tissue disorders: Secondary | ICD-10-CM | POA: Diagnosis not present

## 2020-12-13 DIAGNOSIS — I10 Essential (primary) hypertension: Secondary | ICD-10-CM | POA: Diagnosis not present

## 2020-12-13 DIAGNOSIS — I8390 Asymptomatic varicose veins of unspecified lower extremity: Secondary | ICD-10-CM | POA: Diagnosis not present

## 2020-12-13 DIAGNOSIS — K219 Gastro-esophageal reflux disease without esophagitis: Secondary | ICD-10-CM

## 2020-12-13 DIAGNOSIS — Z0000 Encounter for general adult medical examination without abnormal findings: Secondary | ICD-10-CM

## 2020-12-13 DIAGNOSIS — K580 Irritable bowel syndrome with diarrhea: Secondary | ICD-10-CM | POA: Diagnosis not present

## 2020-12-13 NOTE — Assessment & Plan Note (Signed)
Contributing to peripheral edema.

## 2020-12-13 NOTE — Assessment & Plan Note (Signed)
New Start metamucil fiber daily.Marland Kitchengradaully increase, decrease greasy foods and stress. We can consider an antispasmodic at next office visit  If not better.

## 2020-12-13 NOTE — Assessment & Plan Note (Signed)
Stable, chronic.  Continue current medication.    

## 2020-12-13 NOTE — Assessment & Plan Note (Signed)
Stable, chronic.  Continue current medication. ? ? ?Atorvastatin 20 mg daily ?

## 2020-12-13 NOTE — Progress Notes (Signed)
VIRTUAL VISIT Due to national recommendations of social distancing due to [place] 19, a virtual visit is felt to be most appropriate for this patient at this time.   I connected with the patient on 12/13/20 at  8:40 AM EST by virtual telehealth platform and verified that I am speaking with the correct person using two identifiers.   Interactive audio and video telecommunications were attempted between this provider and patient, however failed, due to patient having technical difficulties OR patient did not have access to video capability.  We continued and completed visit with audio only.   Interactive audio and video telecommunications were attempted between this provider and patient, however failed, due to patient having technical difficulties OR patient did not have access to video capability.  We continued and completed visit with audio only.    I discussed the limitations, risks, security and privacy concerns of performing an evaluation and management service by  virtual telehealth platform and the availability of in person appointments. I also discussed with the patient that there may be a patient responsible charge related to this service. The patient expressed understanding and agreed to proceed.  Patient location: Home Provider Location: [facility] Participants: Eliezer Lofts and [name]   Chief Complaint  Patient presents with  . Annual Exam  . Gastroesophageal Reflux  . Leg Swelling    Left leg. Wearing compression socks. Tingling in her foot and pain in toes.     History of Present Illness:   She was unable to get video set up so we will have her come in at a later time to do AMW/CPX.  She is having the following problems.  Hypertension:   Well controlled on  Amlodipine 10 mg  She has been on lower dose of HCTZ given low potassium since 12/03/19 BP Readings from Last 3 Encounters:  12/13/20 127/67  04/15/20 129/75  04/04/20 128/78  Using medication  without problems or lightheadedness:  none Chest pain with exertion:none Edema: yes.. see below Short of breath: none Average home BPs: Other issues:  Elevated Cholesterol: LDL at goal on atorvastatin 20 mg daily   Lab Results  Component Value Date   CHOL 134 12/05/2020   HDL 44.20 12/05/2020   LDLCALC 78 12/05/2020   TRIG 60.0 12/05/2020   CHOLHDL 3 12/05/2020  Using medications without problems:none Muscle aches: none Diet compliance: heart healthy Exercise: minimal given back issues Other complaints:  Prediabetes:   Lab Results  Component Value Date   HGBA1C 5.3 12/11/2019   Pain and swelling in left lower leg:  02/2020 [name] tibial DVT in 1 of peroneal veins Has been seeing vascular MD Dr. Ronalee Belts.. repeat US did not show a clear clot. He recommended stronger compression hose.. this was to uncomfortable to her. Sclerotherapy did not help.  Has tingling in toes when wearing compression hose She has been seeing podiatry.. suggested toeless... this causes feet to swelling She wears compression hose daily.  GERD, new:  She has noted worsening symptoms in last few days. This week  Stomach upset interfered with sleep.  Restart omeprazole 20 mg daily.   Last eye exam Dr Jeni Salles 05/09/2020 cataract right eye noted.  Flowsheet Row Video Visit from 12/13/2020 in [place] at [facility] Total Score 0     2-3 BMs each day , uncontrollable.. soft stool , always in AM. Very bothersome to her.. cannot go out in AM.  COVID 19 screen No recent travel or known exposure to  COVID19 The patient denies respiratory symptoms of COVID 19 at this time.  The importance of social distancing was discussed today.   Review of Systems  Constitutional: Negative for chills and fever.  HENT: Negative for congestion and ear pain.   Eyes: Negative for pain and redness.  Respiratory: Negative for cough and shortness of breath.   Cardiovascular: Negative for chest pain,  palpitations and leg swelling.  Gastrointestinal: Negative for abdominal pain, blood in stool, constipation, diarrhea, nausea and vomiting.  Genitourinary: Negative for dysuria.  Musculoskeletal: Negative for falls and myalgias.  Skin: Negative for rash.  Neurological: Negative for dizziness.  Psychiatric/Behavioral: Negative for depression. The patient is not nervous/anxious.       Past Medical History:  Diagnosis Date  . 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 Allergy   . Arthritis   . Asthma   . Complication of anesthesia    stays sleepy  . Diverticulosis of colon   . Gallstones   . Hyperlipidemia   . Hypertension   . Osteoarthritis   . Symptomatic mammary hypertrophy     reports that she quit smoking about 47 years ago. Her smoking use included cigarettes. She quit after 15.00 years of use. She has never used smokeless tobacco. She reports that she does not drink alcohol and does not use drugs.   Current Outpatient Medications:  .  albuterol (VENTOLIN HFA) 108 (90 Base) MCG/ACT inhaler, Inhale 1-2 puffs into the lungs every 6 (six) hours as needed for shortness of breath (for use when in contact with dogs or cats.)., Disp: 6.7 g, Rfl: 0 .  amLODipine (NORVASC) 10 MG tablet, TAKE 1 TABLET BY MOUTH  DAILY, Disp: 90 tablet, Rfl: 3 .  atorvastatin (LIPITOR) 20 MG tablet, TAKE 1 TABLET BY MOUTH  DAILY, Disp: 90 tablet, Rfl: 3 .  cetirizine (ZYRTEC) 10 MG tablet, Take 10 mg by mouth daily as needed for allergies. Reported on 01/10/2016, Disp: , Rfl:  .  cholecalciferol (VITAMIN D) 1000 UNITS tablet, Take 1,000 Units by mouth daily., Disp: , Rfl:  .  hydrochlorothiazide (MICROZIDE) 12.5 MG capsule, TAKE 1 CAPSULE BY MOUTH  DAILY, Disp: 90 capsule, Rfl: 0 .  Zoster Vaccine Adjuvanted (SHINGRIX) injection, Shingrix (PF) 50 mcg/0.5 mL intramuscular suspension, kit (Patient not taking: Reported on 12/13/2020), Disp: , Rfl:    Observations/Objective: Blood  pressure 127/67, pulse 93.  Physical Exam  Physical Exam Constitutional:      General: The patient is not in acute distress. Pulmonary:     Effort: Pulmonary effort is normal. No respiratory distress.  Neurological:     Mental Status: The patient is alert and oriented to person, place, and time.  Psychiatric:        Mood and Affect: Mood normal.        Behavior: Behavior normal.   Assessment and Plan  Total visit time 30 minutes, > 50% spent counseling and cordinating patients care.  Problem List Items Addressed This Visit    Essential hypertension, benign (Chronic)    Stable, chronic.   Need to make changes in medications given peripheral swelling.   Change to HCTZ 25 mg daily  Amlodipine 5 mg daily.        Gastroesophageal reflux disease     New Decrease acidic foods, soda, caffeine, alcohol, spicy foods to help with reflux.  Start omeprazole daily 20 mg.  For breakthrough.. Pepcid AC as needed.  Can go up to 40  mg daily of omeprazole after 1 week if lower dose not helping.      High cholesterol (Chronic)    Stable, chronic.  Continue current medication.   Atorvastatin 20 mg daily      Irritable bowel syndrome with diarrhea    New Start metamucil fiber daily.Marland Kitchengradaully increase, decrease greasy foods and stress. We can consider an antispasmodic at next office visit  If not better.        Pain and swelling of left lower leg    Repeat US clear. No further sclerotherapy possible per Vascular.  Wear compression hose.. increase activity as tolerated.  Will make changes with BP meds to see if symptoms improved.  Decrease amlodipine to 5 mg daily. Increase HCTZ to 25 mg daily.  Keep up with potassium in diet.  re-eval potassium at OV in 2 weeks.       Prediabetes (Chronic)    Stable, chronic.   Good control with diet.         VARICOSE VEINS, LOWER EXTREMITIES (Chronic)    Contributing to peripheral edema.      Vitamin D deficiency (Chronic)     Stable, chronic.  Continue current medication.          Other Visit Diagnoses    Medicare annual wellness visit, subsequent    -  Primary         I discussed the assessment and treatment plan with the patient. The patient was provided an opportunity to ask questions and all were answered. The patient agreed with the plan and demonstrated an understanding of the instructions.   The patient was advised to call back or seek an in-person evaluation if the symptoms worsen or if the condition fails to improve as anticipated.   Eliezer Lofts, MD

## 2020-12-13 NOTE — Assessment & Plan Note (Addendum)
Repeat US clear. No further sclerotherapy possible per Vascular.  Wear compression hose.. increase activity as tolerated.  Will make changes with BP meds to see if symptoms improved.  Decrease amlodipine to 5 mg daily. Increase HCTZ to 25 mg daily.  Keep up with potassium in diet.  re-eval potassium at OV in 2 weeks.

## 2020-12-13 NOTE — Patient Instructions (Addendum)
Leg Swelling:  Decrease amlodipine to 5 mg daily. Increase HCTZ to 25 mg daily.  Keep up with potassium in diet   Continue compression hose.  Try to increase exercise.. foot peddling. GERD: Decrease acidic foods, soda, caffeine, alcohol, spicy foods to help with reflux.  Start omeprazole daily 20 mg.  For breakthrough.. Pepcid AC as needed.  Can go up to 40 mg daily of omeprazole after 1 week if lower dose not helping.  IBS D likely:  Start metamucil fiber daily.Marland Kitchengradaully increase, decrease greasy foods and stress. We can consider an antispasmodic at next office visit  If not better.  Call to make  appt for medicare wellness rescheduling and follow up on swelling at OV in 2 weeks.

## 2020-12-13 NOTE — Assessment & Plan Note (Signed)
New Decrease acidic foods, soda, caffeine, alcohol, spicy foods to help with reflux.  Start omeprazole daily 20 mg.  For breakthrough.. Pepcid AC as needed.  Can go up to 40 mg daily of omeprazole after 1 week if lower dose not helping.

## 2020-12-13 NOTE — Assessment & Plan Note (Signed)
Stable, chronic.   Good control with diet.    

## 2020-12-13 NOTE — Assessment & Plan Note (Signed)
Stable, chronic.   Need to make changes in medications given peripheral swelling.   Change to HCTZ 25 mg daily  Amlodipine 5 mg daily.

## 2020-12-14 ENCOUNTER — Ambulatory Visit: Payer: Medicare Other

## 2020-12-21 ENCOUNTER — Ambulatory Visit (INDEPENDENT_AMBULATORY_CARE_PROVIDER_SITE_OTHER): Payer: Medicare Other | Admitting: Podiatry

## 2020-12-21 ENCOUNTER — Other Ambulatory Visit: Payer: Self-pay

## 2020-12-21 ENCOUNTER — Encounter: Payer: Self-pay | Admitting: Podiatry

## 2020-12-21 DIAGNOSIS — M79671 Pain in right foot: Secondary | ICD-10-CM | POA: Diagnosis not present

## 2020-12-21 DIAGNOSIS — M79675 Pain in left toe(s): Secondary | ICD-10-CM

## 2020-12-21 DIAGNOSIS — M79674 Pain in right toe(s): Secondary | ICD-10-CM | POA: Diagnosis not present

## 2020-12-21 DIAGNOSIS — B351 Tinea unguium: Secondary | ICD-10-CM

## 2020-12-21 DIAGNOSIS — Q828 Other specified congenital malformations of skin: Secondary | ICD-10-CM

## 2020-12-22 ENCOUNTER — Other Ambulatory Visit: Payer: Self-pay | Admitting: Family Medicine

## 2020-12-22 DIAGNOSIS — Z1231 Encounter for screening mammogram for malignant neoplasm of breast: Secondary | ICD-10-CM

## 2020-12-25 NOTE — Progress Notes (Signed)
Subjective: Tracy Dixon presents today for follow up of painful porokeratotic lesion(s) submet head 3 right and painful mycotic toenails b/l that limit ambulation. Aggravating factors include weightbearing with and without shoe gear. Pain for both is relieved with periodic professional debridement.   She voices no new pedal problems on today's visit.  Allergies  Allergen Reactions  . Contrast Media [Iodinated Diagnostic Agents] Hives  . Crab [Shellfish Allergy] Hives     Objective: There were no vitals filed for this visit.   Pt is a pleasant 80 y.o. year old AA female in NAD. AAO x 3.   Vascular Examination:  Capillary refill time to digits immediate b/l. Palpable DP pulses b/l. Palpable PT pulses b/l. Pedal hair present b/l. Skin temperature gradient within normal limits b/l. Varicosities present b/l.  Dermatological Examination: Pedal skin with normal turgor, texture and tone bilaterally. No open wounds bilaterally. No interdigital macerations bilaterally. Toenails 1-5 b/l elongated, dystrophic, thickened, crumbly with subungual debris and tenderness to dorsal palpation. Porokeratotic lesion(s) submet head 3 right foot. No erythema, no edema, no drainage, no flocculence.  Musculoskeletal: Normal muscle strength 5/5 to all lower extremity muscle groups bilaterally, no pain crepitus or joint limitation noted with ROM b/l and hammertoes noted to the  2-5 bilaterally  Neurological: Protective sensation intact 5/5 intact bilaterally with 10g monofilament b/l. Babinski reflex negative b/l. Achilles reflex 2+ b/. Clonus negative b/l.  Assessment: 1. Pain due to onychomycosis of toenails of both feet   2. Porokeratosis   3. Right foot pain    Plan: -Examined patient. -No new findings. No new orders. -Toenails 1-5 b/l were debrided in length and girth with sterile nail nippers and dremel without iatrogenic bleeding.  -Painful porokeratotic lesion(s) submet head 3 right foot pared  and enucleated with sterile scalpel blade without incident. -Patient to report any pedal injuries to medical professional immediately. -Patient to continue soft, supportive shoe gear daily. -Patient/POA to call should there be question/concern in the interim.  Return in about 3 months (around 03/21/2021).

## 2020-12-28 ENCOUNTER — Other Ambulatory Visit: Payer: Self-pay

## 2020-12-29 ENCOUNTER — Ambulatory Visit (INDEPENDENT_AMBULATORY_CARE_PROVIDER_SITE_OTHER): Payer: Medicare Other | Admitting: Family Medicine

## 2020-12-29 ENCOUNTER — Encounter: Payer: Self-pay | Admitting: Family Medicine

## 2020-12-29 VITALS — BP 140/84 | HR 85 | Temp 97.7°F | Ht 60.75 in | Wt 169.8 lb

## 2020-12-29 DIAGNOSIS — I10 Essential (primary) hypertension: Secondary | ICD-10-CM | POA: Diagnosis not present

## 2020-12-29 DIAGNOSIS — M7989 Other specified soft tissue disorders: Secondary | ICD-10-CM

## 2020-12-29 DIAGNOSIS — K219 Gastro-esophageal reflux disease without esophagitis: Secondary | ICD-10-CM | POA: Diagnosis not present

## 2020-12-29 DIAGNOSIS — Z0000 Encounter for general adult medical examination without abnormal findings: Secondary | ICD-10-CM | POA: Diagnosis not present

## 2020-12-29 DIAGNOSIS — K580 Irritable bowel syndrome with diarrhea: Secondary | ICD-10-CM

## 2020-12-29 DIAGNOSIS — M79662 Pain in left lower leg: Secondary | ICD-10-CM | POA: Diagnosis not present

## 2020-12-29 LAB — BASIC METABOLIC PANEL
BUN: 15 mg/dL (ref 6–23)
CO2: 35 mEq/L — ABNORMAL HIGH (ref 19–32)
Calcium: 9.3 mg/dL (ref 8.4–10.5)
Chloride: 97 mEq/L (ref 96–112)
Creatinine, Ser: 0.77 mg/dL (ref 0.40–1.20)
GFR: 73.16 mL/min (ref >60.00)
Glucose, Bld: 85 mg/dL (ref 70–99)
Potassium: 3.3 mEq/L — ABNORMAL LOW (ref 3.5–5.1)
Sodium: 137 mEq/L (ref 135–145)

## 2020-12-29 NOTE — Assessment & Plan Note (Signed)
Improved swelling in left leg with lower amlodipine and higher dose of HCTZ. If not continuing to improve we can consider D/C of amlodipine and change to losartan/HCTZ.

## 2020-12-29 NOTE — Patient Instructions (Addendum)
Please stop at the lab to have labs drawn. Continue HCTZ and lower dose amlodipine. Keep up with fiber in diet. Call if interested in antispasmodic for diarrhea.  Keep up with fiber in diet.

## 2020-12-29 NOTE — Assessment & Plan Note (Signed)
Improved with increased fiber and decreased grease in diet. Will hold for now on trial of antispasmodic.

## 2020-12-29 NOTE — Assessment & Plan Note (Signed)
Chroninc  Improved on PPI , omeprazole 20 mg daily. Can try to wean off after 4-6 weeks.

## 2020-12-29 NOTE — Progress Notes (Signed)
Patient ID: Tracy Dixon, female    DOB: [birth year], 80 y.o.   MRN: [id number]  This visit was conducted in person.  BP 140/84   Pulse 85   Temp 97.7 F (36.5 C) (Temporal)   Ht 5' 0.75" (1.543 m)   Wt 169 lb 12 oz (77 kg)   SpO2 97%   BMI 32.34 kg/m    CC:  Chief Complaint  Patient presents with  . Medicare Wellness    Video visit 12/13/2020  . Follow-up    Swelling    Subjective:   HPI: Tracy Dixon is a 80 y.o. female presenting on 12/29/2020 for Medicare Wellness (Video visit 12/13/2020) and Follow-up (Swelling) I have personally reviewed the Medicare Annual Wellness questionnaire and have noted 1. The patient's medical and social history 2. Their use of alcohol, tobacco or illicit drugs 3. Their current medications and supplements 4. The patient's functional ability including ADL's, fall risks, home safety risks and hearing or visual             impairment. 5. Diet and physical activities 6. Evidence for depression or mood disorders 7.         Updated provider list Cognitive evaluation was performed and recorded on pt medicare questionnaire form. The patients weight, height, BMI and visual acuity have been recorded in the chart  I have made referrals, counseling and provided education to the patient based review of the above and I have provided the pt with a written personalized care plan for preventive services.   Documentation of this information was scanned into the electronic record under the media tab.   Advance directives and end of life planning reviewed in detail with patient and documented in EMR. Patient given handout on advance care directives if needed. HCPOA and living will updated if needed.   Hearing Screening   '125Hz'  '250Hz'  '500Hz'  '1000Hz'  '2000Hz'  '3000Hz'  '4000Hz'  '6000Hz'  '8000Hz'   Right ear:           Left ear:           Comments: Patient has acoustic neuroma-followed by otolaryngologist  Vision Screening Comments: Eye Exam at [facility]  02/2020  [place] Office Visit from 12/29/2020 in [place] [facility] [place]  PHQ-2 Total Score 0      ON 12/13/2020 we discuss her chronic pain and swelling of left leg: Pain and swelling in left lower leg:  02/2020 [name] tibial DVT in 1 of peroneal veins Has been seeing vascular MD Dr. Ronalee Belts.. repeat US did not show a clear clot. He recommended stronger compression hose.. this was to uncomfortable to her. Sclerotherapy did not help.  Has tingling in toes when wearing compression hose She has been seeing podiatry.. suggested toeless... this causes feet to swelling She wears compression hose daily. At that appt we made changes with her BP meds.Marland Kitchen decreased amlodipine to 5 mg daily ( as could be contributing to swelling) Increased HCTZ to 25 mg daily ( needs potassium re-eval today.   She reports in last 2 weeks she has noted: Swelling is better, still having some burning in  Left foot. BP at home has been 127/79. BP Readings from Last 3 Encounters:  12/29/20 140/84  12/13/20 127/67  04/15/20 129/75   Wt Readings from Last 3 Encounters:  12/29/20 169 lb 12 oz (77 kg)  04/15/20 176 lb (79.8 kg)  04/04/20 177 lb (80.3 kg)    GERD: now on omeprazole 20 mg daily and avoiding acidic foods.  This is much better overall.    IBS D: at last OV recommended metamucil and decrease of greasy foods.  She has noted an improvement overall.  Relevant past medical, surgical, family and social history reviewed and updated as indicated. Interim medical history since our last visit reviewed. Allergies and medications reviewed and updated. Outpatient Medications Prior to Visit  Medication Sig Dispense Refill  . albuterol (VENTOLIN HFA) 108 (90 Base) MCG/ACT inhaler Inhale 1-2 puffs into the lungs every 6 (six) hours as needed for shortness of breath (for use when in contact with dogs or cats.). 6.7 g 0  . amLODipine (NORVASC) 10 MG tablet TAKE 1 TABLET BY MOUTH  DAILY 90 tablet 3  . atorvastatin  (LIPITOR) 20 MG tablet TAKE 1 TABLET BY MOUTH  DAILY 90 tablet 3  . cetirizine (ZYRTEC) 10 MG tablet Take 10 mg by mouth daily as needed for allergies. Reported on 01/10/2016    . cholecalciferol (VITAMIN D) 1000 UNITS tablet Take 1,000 Units by mouth daily.    . hydrochlorothiazide (MICROZIDE) 12.5 MG capsule TAKE 1 CAPSULE BY MOUTH  DAILY 90 capsule 0  . Zoster Vaccine Adjuvanted [facility]) injection Shingrix (PF) 50 mcg/0.5 mL intramuscular suspension, kit (Patient not taking: Reported on 12/13/2020)     No facility-administered medications prior to visit.     Per HPI unless specifically indicated in ROS section below Review of Systems Objective:  BP 140/84   Pulse 85   Temp 97.7 F (36.5 C) (Temporal)   Ht 5' 0.75" (1.543 m)   Wt 169 lb 12 oz (77 kg)   SpO2 97%   BMI 32.34 kg/m   Wt Readings from Last 3 Encounters:  12/29/20 169 lb 12 oz (77 kg)  04/15/20 176 lb (79.8 kg)  04/04/20 177 lb (80.3 kg)      Physical Exam    Results for orders placed or performed in visit on 12/05/20  Hepatitis C antibody  Result Value Ref Range   Hepatitis C Ab NON-REACTIVE NON-REACTI   SIGNAL TO CUT-OFF 0.01 <1.00  VITAMIN D 25 Hydroxy (Vit-D Deficiency, Fractures)  Result Value Ref Range   VITD 78.14 30.00 - 100.00 ng/mL  Comprehensive metabolic panel  Result Value Ref Range   Sodium 136 135 - 145 mEq/L   Potassium 4.1 3.5 - 5.1 mEq/L   Chloride 101 96 - 112 mEq/L   CO2 30 19 - 32 mEq/L   Glucose, Bld 76 70 - 99 mg/dL   BUN 19 6 - 23 mg/dL   Creatinine, Ser 0.75 0.40 - 1.20 mg/dL   Total Bilirubin 0.4 0.2 - 1.2 mg/dL   Alkaline Phosphatase 54 39 - 117 U/L   AST 15 0 - 37 U/L   ALT 12 0 - 35 U/L   Total Protein 7.9 6.0 - 8.3 g/dL   Albumin 3.9 3.5 - 5.2 g/dL   GFR 75.55 >60.00 mL/min   Calcium 9.5 8.4 - 10.5 mg/dL  Lipid panel  Result Value Ref Range   Cholesterol 134 0 - 200 mg/dL   Triglycerides 60.0 0.0 - 149.0 mg/dL   HDL 44.20 >39.00 mg/dL   VLDL 12.0 0.0 - 40.0 mg/dL    LDL Cholesterol 78 0 - 99 mg/dL   Total CHOL/HDL Ratio 3    NonHDL 90.22     This visit occurred during the SARS-CoV-2 public health emergency.  Safety protocols were in place, including screening questions prior to the visit, additional usage of staff PPE, and extensive cleaning of exam  room while observing appropriate contact time as indicated for disinfecting solutions.   COVID 19 screen:  No recent travel or known exposure to COVID19 The patient denies respiratory symptoms of COVID 19 at this time. The importance of social distancing was discussed today.   Assessment and Plan The patient's preventative maintenance and recommended screening tests for an annual wellness exam were reviewed in full today. Brought up to date unless services declined.  Counselled on the importance of diet, exercise, and its role in overall health and mortality. The patient's FH and SH was reviewed, including their home life, tobacco status, and drug and alcohol status.   Vaccines:uptodate, refused tdap. Has gotten COVID19 x 3, shingrix, flu Pap/DVE:No pap/no DVE indicated,No family history of uterine or ovarian cancer. No vaginal bleeding, no discharge. Mammo:12/2019... 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  Problem List Items Addressed This Visit    Essential hypertension, benign (Chronic)    Blood pressure stable at home on new regimen of HCTZ and lower dose amlodipine. Check BMET today for electrolyte and potassium stability.      Gastroesophageal reflux disease    Chroninc  Improved on PPI , omeprazole 20 mg daily. Can try to wean off after 4-6 weeks.      Relevant Medications   omeprazole (PRILOSEC OTC) 20 MG tablet   Irritable bowel syndrome with diarrhea    Improved with increased fiber and  decreased grease in diet. Will hold for now on trial of antispasmodic.      Relevant Medications   omeprazole (PRILOSEC OTC) 20 MG tablet   Pain and swelling of left lower leg    Improved swelling in left leg with lower amlodipine and higher dose of HCTZ. If not continuing to improve we can consider D/C of amlodipine and change to losartan/HCTZ.      Relevant Orders   Basic metabolic panel    Other Visit Diagnoses    Medicare annual wellness visit, subsequent    -  Primary         Eliezer Lofts, MD

## 2020-12-29 NOTE — Assessment & Plan Note (Signed)
Blood pressure stable at home on new regimen of HCTZ and lower dose amlodipine. Check BMET today for electrolyte and potassium stability.

## 2021-01-05 ENCOUNTER — Other Ambulatory Visit: Payer: Self-pay | Admitting: Family Medicine

## 2021-01-18 ENCOUNTER — Other Ambulatory Visit: Payer: Self-pay

## 2021-01-18 ENCOUNTER — Ambulatory Visit
Admission: RE | Admit: 2021-01-18 | Discharge: 2021-01-18 | Disposition: A | Discharge status: Home / Self Care | Payer: Medicare Other | Source: Ambulatory Visit | Attending: Family Medicine | Admitting: Family Medicine

## 2021-01-18 ENCOUNTER — Other Ambulatory Visit: Payer: Self-pay | Admitting: Family Medicine

## 2021-01-18 DIAGNOSIS — Z1231 Encounter for screening mammogram for malignant neoplasm of breast: Secondary | ICD-10-CM | POA: Insufficient documentation

## 2021-01-19 NOTE — Telephone Encounter (Signed)
Looks like patient was decreased to Amlodipine 5 mg with discussion of possible stopping it.  Refill?

## 2021-01-27 ENCOUNTER — Telehealth (INDEPENDENT_AMBULATORY_CARE_PROVIDER_SITE_OTHER): Payer: Self-pay | Admitting: Vascular Surgery

## 2021-01-27 NOTE — Telephone Encounter (Signed)
Patient can come in for bilateral venous reflux see Arna Medici or [employer]

## 2021-01-27 NOTE — Telephone Encounter (Signed)
Called stating that the past 2 months she has been swelling in her legs. Patient states she is also experiencing burning/tingling in her feet going up to her legs. Patient states that she went to her PCP and she advised her to come in to be seen. Patient was last seen 03/2020 with lle ven dvt studies (GS). Please advise.

## 2021-01-27 NOTE — Telephone Encounter (Signed)
Called and scheduled patient

## 2021-01-30 ENCOUNTER — Ambulatory Visit (INDEPENDENT_AMBULATORY_CARE_PROVIDER_SITE_OTHER): Payer: Medicare Other

## 2021-01-30 ENCOUNTER — Other Ambulatory Visit: Payer: Self-pay

## 2021-01-30 ENCOUNTER — Ambulatory Visit (INDEPENDENT_AMBULATORY_CARE_PROVIDER_SITE_OTHER): Payer: Medicare Other | Admitting: Nurse Practitioner

## 2021-01-30 ENCOUNTER — Encounter (INDEPENDENT_AMBULATORY_CARE_PROVIDER_SITE_OTHER): Payer: Self-pay

## 2021-01-30 ENCOUNTER — Other Ambulatory Visit (INDEPENDENT_AMBULATORY_CARE_PROVIDER_SITE_OTHER): Payer: Self-pay | Admitting: Vascular Surgery

## 2021-01-30 VITALS — BP 146/82 | HR 96 | Ht 61.0 in | Wt 171.0 lb

## 2021-01-30 DIAGNOSIS — R600 Localized edema: Secondary | ICD-10-CM

## 2021-01-30 DIAGNOSIS — I890 Lymphedema, not elsewhere classified: Secondary | ICD-10-CM | POA: Diagnosis not present

## 2021-01-30 DIAGNOSIS — R208 Other disturbances of skin sensation: Secondary | ICD-10-CM | POA: Diagnosis not present

## 2021-01-30 DIAGNOSIS — I8312 Varicose veins of left lower extremity with inflammation: Secondary | ICD-10-CM | POA: Diagnosis not present

## 2021-01-30 DIAGNOSIS — I10 Essential (primary) hypertension: Secondary | ICD-10-CM

## 2021-02-05 ENCOUNTER — Encounter (INDEPENDENT_AMBULATORY_CARE_PROVIDER_SITE_OTHER): Payer: Self-pay | Admitting: Nurse Practitioner

## 2021-02-05 NOTE — Progress Notes (Signed)
Subjective:    Patient ID: Tracy Dixon, female    DOB: 09-03-1941, 80 y.o.   MRN: [id number] Chief Complaint  Patient presents with  . Follow-up    Add on per phone note U/S    Tracy Dixon is a 80 year old female that presents today due to worsening swelling of her lower extremity.  The patient has had known swelling in her left lower extremity for years however she has maintained good control with utilizing compression socks and elevation.  But she notes that recently she has began having more burning in her left lower extremity as well as swelling.  This burning goes up her leg it happens mostly while she is in bed.  She has cramping in the leg as well.  She does have a previous history of a left DVT.  The patient was checked for a DVT by her primary care physician but none is found.  She continues to do conservative therapy as she has always done but has not been helpful for her swelling.  She denies any open wounds or ulcerations.  She denies any fevers or chills.  Noninvasive studies today show no evidence of DVT or superficial venous thrombosis in the left lower extremity.  There is evidence of superficial venous reflux in the great saphenous vein at the saphenofemoral junction as well as at the distal thigh extending into the proximal calf.  No evidence of deep venous insufficiency seen.   Review of Systems  Cardiovascular: Positive for leg swelling.  Neurological:       Burning  All other systems reviewed and are negative.      Objective:   Physical Exam Vitals reviewed.  HENT:     Head: Normocephalic.  Cardiovascular:     Rate and Rhythm: Normal rate.     Pulses: Normal pulses.  Pulmonary:     Effort: Pulmonary effort is normal.  Musculoskeletal:     Left lower leg: 3+ Edema present.  Neurological:     Mental Status: She is alert and oriented to person, place, and time.  Psychiatric:        Mood and Affect: Mood normal.        Behavior: Behavior normal.         Thought Content: Thought content normal.        Judgment: Judgment normal.     BP (!) 146/82   Pulse 96   Ht 5\' 1"  (1.549 m)   Wt 171 lb (77.6 kg)   BMI 32.31 kg/m   Past Medical History:  Diagnosis Date  . 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 Allergy   . Arthritis   . Asthma   . Complication of anesthesia    stays sleepy  . Diverticulosis of colon   . Gallstones   . Hyperlipidemia   . Hypertension   . Osteoarthritis   . Symptomatic mammary hypertrophy     Social History   Socioeconomic History  . Marital status: Married    Spouse name: Not on file  . Number of children: 4  . Years of education: Not on file  . Highest education level: Not on file  Occupational History  . 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 Smoking status: Former Smoker    Years: 15.00    Types: Cigarettes    Quit date: 06/02/1973    Years since quitting: 47.7  .  Smokeless tobacco: Never Used  . Tobacco comment: few cigs/day  Vaping Use  . Vaping Use: Never used  Substance and Sexual Activity  . Alcohol use: No  . Drug use: No  . Sexual activity: Not on file  Other Topics Concern  . Not on file  Social History Narrative   Regular exercise:  No   Diet:  Fruits and veggies, rare FF, no water    No living will, no HCPOA.   Social Determinants of Health   Financial Resource Strain: Not on file  Food Insecurity: Not on file  Transportation Needs: Not on file  Physical Activity: Not on file  Stress: Not on file  Social Connections: Not on file  Intimate Partner Violence: Not on file    Past Surgical History:  Procedure Laterality Date  . Bilateral tubal ligation  2005  . BREAST REDUCTION SURGERY Bilateral 06/04/2018   Procedure: MAMMARY REDUCTION  (BREAST);  Surgeon: Wallace Going, DO;  Location: [place];  Service: Plastics;  Laterality: Bilateral;  . Carotid doppler  07/2006    Neg  . CESAREAN SECTION  2005  . CHOLECYSTECTOMY  1982  . DECOMPRESSIVE LUMBAR LAMINECTOMY LEVEL 3  06/10/2013   Procedure: DECOMPRESSIVE LUMBAR LAMINECTOMY LEVEL 3;  Surgeon: Melina Schools, MD;  Location: [place];  Service: Orthopedics;;  lumbar three to five decompression  . MRI brain  2007   acoustic neuroma (check yearly with MRI)  . PFT's  12/2006   (-) FEV1/FVC 79%, FEV1 84%  . REDUCTION MAMMAPLASTY Bilateral 05/2018  . REPLACEMENT TOTAL KNEE BILATERAL  2005  . TONSILLECTOMY  2005  . TUBAL LIGATION      Family History  Problem Relation Age of Onset  . Cancer Mother        Lung CA  . Stroke Mother        CVA  . Cancer Father        Prostate  . Heart disease Neg Hx   . Breast cancer Neg Hx     Allergies  Allergen Reactions  . Contrast Media [Iodinated Diagnostic Agents] Hives  . Crab [Shellfish Allergy] Hives    CBC Latest Ref Rng & Units 04/20/2019 06/04/2018 03/20/2016  WBC 4.0 - 10.5 K/uL 7.2 - 8.8  Hemoglobin 12.0 - 15.0 g/dL 13.6 13.9 12.9  Hematocrit 36.0 - 46.0 % 42.6 41.0 39.3  Platelets 150 - 400 K/uL 231 - 237.0      CMP     Component Value Date/Time   NA 137 12/29/2020 1114   K 3.3 (L) 12/29/2020 1114   CL 97 12/29/2020 1114   CO2 35 (H) 12/29/2020 1114   GLUCOSE 85 12/29/2020 1114   BUN 15 12/29/2020 1114   CREATININE 0.77 12/29/2020 1114   CALCIUM 9.3 12/29/2020 1114   PROT 7.9 12/05/2020 0803   ALBUMIN 3.9 12/05/2020 0803   AST 15 12/05/2020 0803   ALT 12 12/05/2020 0803   ALKPHOS 54 12/05/2020 0803   BILITOT 0.4 12/05/2020 0803   GFRNONAA >60 04/20/2019 1054   GFRAA >60 04/20/2019 1054     No results found.     Assessment & Plan:   1. Varicose veins of left lower extremity with inflammation Recommend  I have reviewed my previous  discussion with the patient regarding  varicose veins and why they cause symptoms. Patient will continue  wearing graduated compression stockings class 1 on a daily basis, beginning first thing in the  morning and removing them in the evening.  In addition, behavioral modification including elevation during the day was again discussed and this will continue.  The patient has utilized over the counter pain medications and has been exercising.  However, at this time conservative therapy has not alleviated the patient's symptoms of leg pain and swelling  Recommend: laser ablation of the left great saphenous veins to eliminate the symptoms of pain and swelling of the lower extremities caused by the severe superficial venous reflux disease.   2. Essential hypertension, benign Continue antihypertensive medications as already ordered, these medications have been reviewed and there are no changes at this time.   3. Lymphedema The patient has had worsening swelling despite continued conservative therapy.  Given the patient's severe reflux it is certainly reasonable to proceed with endovenous laser ablation as outlined above.  The patient should continue with conservative therapy that has been worked well for her previously such as continue to utilize medical grade 1 compression socks, elevation and activity.  We will reevaluate patient's lower extremity edema following her intervention as outlined above.   4. Burning sensation of lower extremity Endovenous laser ablation may help with the sensation as patients can have a burning stinging sensation with severe superficial reflux however the patient also has known back issues that to may be causing the increased cramping and burning sensations in her lower extremities.  Patient is advised to follow-up with back doctor for further evaluation as well.    Current Outpatient Medications on File Prior to Visit  Medication Sig Dispense Refill  . albuterol (VENTOLIN HFA) 108 (90 Base) MCG/ACT inhaler Inhale 1-2 puffs into the lungs every 6 (six) hours as needed for shortness of breath (for use when in contact with dogs or cats.). 6.7 g 0  . amLODipine  (NORVASC) 5 MG tablet Take 1 tablet (5 mg total) by mouth daily. 30 tablet 3  . atorvastatin (LIPITOR) 20 MG tablet TAKE 1 TABLET BY MOUTH  DAILY 90 tablet 3  . cetirizine (ZYRTEC) 10 MG tablet Take 10 mg by mouth daily as needed for allergies. Reported on 01/10/2016    . cholecalciferol (VITAMIN D) 1000 UNITS tablet Take 1,000 Units by mouth daily.    . hydrochlorothiazide (MICROZIDE) 12.5 MG capsule TAKE 1 CAPSULE BY MOUTH  DAILY 90 capsule 0  . omeprazole (PRILOSEC OTC) 20 MG tablet Take 20 mg by mouth daily.     No current facility-administered medications on file prior to visit.    There are no Patient Instructions on file for this visit. No follow-ups on file.   Kris Hartmann, NP

## 2021-02-06 ENCOUNTER — Other Ambulatory Visit: Payer: Self-pay | Admitting: Family Medicine

## 2021-02-27 DIAGNOSIS — M5459 Other low back pain: Secondary | ICD-10-CM | POA: Diagnosis not present

## 2021-02-28 NOTE — Telephone Encounter (Signed)
Patient returned a call to [name]. Not sure who called. No notes  Her Phone # [phone]

## 2021-03-01 ENCOUNTER — Other Ambulatory Visit: Payer: Self-pay | Admitting: Family Medicine

## 2021-03-01 DIAGNOSIS — R195 Other fecal abnormalities: Secondary | ICD-10-CM | POA: Insufficient documentation

## 2021-03-01 DIAGNOSIS — K580 Irritable bowel syndrome with diarrhea: Secondary | ICD-10-CM

## 2021-03-06 ENCOUNTER — Encounter: Payer: Self-pay | Admitting: *Deleted

## 2021-03-06 DIAGNOSIS — M961 Postlaminectomy syndrome, not elsewhere classified: Secondary | ICD-10-CM | POA: Insufficient documentation

## 2021-03-06 DIAGNOSIS — M545 Low back pain, unspecified: Secondary | ICD-10-CM | POA: Diagnosis not present

## 2021-03-08 ENCOUNTER — Encounter: Payer: Self-pay | Admitting: Internal Medicine

## 2021-03-08 ENCOUNTER — Other Ambulatory Visit: Payer: Self-pay

## 2021-03-08 ENCOUNTER — Ambulatory Visit (INDEPENDENT_AMBULATORY_CARE_PROVIDER_SITE_OTHER): Payer: Medicare Other | Admitting: Internal Medicine

## 2021-03-08 VITALS — BP 138/80 | HR 96 | Temp 98.1°F | Wt 173.0 lb

## 2021-03-08 DIAGNOSIS — R300 Dysuria: Secondary | ICD-10-CM | POA: Diagnosis not present

## 2021-03-08 DIAGNOSIS — R350 Frequency of micturition: Secondary | ICD-10-CM | POA: Diagnosis not present

## 2021-03-08 LAB — POC URINALSYSI DIPSTICK (AUTOMATED)
Glucose, UA: POSITIVE — AB
Nitrite, UA: POSITIVE
Protein, UA: POSITIVE — AB
Spec Grav, UA: 1.015 (ref 1.010–1.025)
Urobilinogen, UA: 4 E.U./dL — AB
pH, UA: 5 (ref 5.0–8.0)

## 2021-03-08 MED ORDER — NITROFURANTOIN MONOHYD MACRO 100 MG PO CAPS
100.0000 mg | ORAL_CAPSULE | Freq: Two times a day (BID) | ORAL | 0 refills | Status: DC
Start: 2021-03-08 — End: 2021-03-14

## 2021-03-08 NOTE — Patient Instructions (Signed)

## 2021-03-08 NOTE — Progress Notes (Signed)
HPI  Pt presents to the clinic today with c/o urinary frequency and dysuria. She reports this started 3 days ago. She denies urgency, blood in her urine, fever, chills, nausea or low back pain. She denies vaginal symptoms. She has tried AZO OTC with some relief of symptoms.   Review of Systems  Past Medical History:  Diagnosis Date  . 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 Allergy   . Arthritis   . Asthma   . Complication of anesthesia    stays sleepy  . Diverticulosis of colon   . Gallstones   . Hyperlipidemia   . Hypertension   . Osteoarthritis   . Symptomatic mammary hypertrophy     Family History  Problem Relation Age of Onset  . Cancer Mother        Lung CA  . Stroke Mother        CVA  . Cancer Father        Prostate  . Heart disease Neg Hx   . Breast cancer Neg Hx     Social History   Socioeconomic History  . Marital status: Married    Spouse name: Not on file  . Number of children: 4  . Years of education: Not on file  . Highest education level: Not on file  Occupational History  . 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 Smoking status: Former Smoker    Years: 15.00    Types: Cigarettes    Quit date: 06/02/1973    Years since quitting: 47.7  . Smokeless tobacco: Never Used  . Tobacco comment: few cigs/day  Vaping Use  . Vaping Use: Never used  Substance and Sexual Activity  . Alcohol use: No  . Drug use: No  . Sexual activity: Not on file  Other Topics Concern  . Not on file  Social History Narrative   Regular exercise:  No   Diet:  Fruits and veggies, rare FF, no water    No living will, no HCPOA.   Social Determinants of Health   Financial Resource Strain: Not on file  Food Insecurity: Not on file  Transportation Needs: Not on file  Physical Activity: Not on file  Stress: Not on file  Social Connections: Not on file  Intimate Partner Violence: Not on file     Allergies  Allergen Reactions  . Contrast Media [Iodinated Diagnostic Agents] Hives  . Crab [Shellfish Allergy] Hives     Constitutional: Denies fever, malaise, fatigue, headache or abrupt weight changes.   GU: Pt reports frequency and pain with urination. Denies urgency, burning sensation, blood in urine, odor or discharge. Skin: Denies redness, rashes, lesions or ulcercations.   No other specific complaints in a complete review of systems (except as listed in HPI above).    Objective:   Physical Exam  BP 138/80   Pulse 96   Temp 98.1 F (36.7 C) (Temporal)   Wt 173 lb (78.5 kg)   SpO2 98%   BMI 32.69 kg/m  Wt Readings from Last 3 Encounters:  03/08/21 173 lb (78.5 kg)  01/30/21 171 lb (77.6 kg)  12/29/20 169 lb 12 oz (77 kg)    General: Appears her stated age, well developed, well nourished in NAD. Cardiovascular: Normal rate and rhythm. S1,S2 noted.   Pulmonary/Chest: Normal effort and positive vesicular breath sounds. No respiratory distress. No wheezes, rales or ronchi noted.  Abdomen: No CVA tenderness.        Assessment & Plan:  Frequency, Dysuria:  Urinalysis: 3+ leuks, 2+ blood Will send urine culture eRx sent if for Macrobid 100 mg BID x 5 days OK to take AZO OTC Drink plenty of fluids  RTC as needed or if symptoms persist. Webb Silversmith, NP

## 2021-03-08 NOTE — Addendum Note (Signed)
Addended by: Lurlean Nanny on: 03/08/2021 04:19 PM   Modules accepted: Orders

## 2021-03-10 LAB — URINE CULTURE
MICRO NUMBER:: 11765843
SPECIMEN QUALITY:: ADEQUATE

## 2021-03-13 ENCOUNTER — Other Ambulatory Visit: Payer: Self-pay

## 2021-03-14 ENCOUNTER — Encounter: Payer: Self-pay | Admitting: Gastroenterology

## 2021-03-14 ENCOUNTER — Other Ambulatory Visit: Payer: Self-pay

## 2021-03-14 ENCOUNTER — Ambulatory Visit (INDEPENDENT_AMBULATORY_CARE_PROVIDER_SITE_OTHER): Payer: Medicare Other | Admitting: Gastroenterology

## 2021-03-14 VITALS — BP 135/87 | HR 93 | Temp 97.3°F | Ht 61.0 in | Wt 176.0 lb

## 2021-03-14 DIAGNOSIS — K529 Noninfective gastroenteritis and colitis, unspecified: Secondary | ICD-10-CM | POA: Diagnosis not present

## 2021-03-14 NOTE — Progress Notes (Signed)
[name], MD [place]  [place], [place] [address]  Main: [phone]  Fax: [phone]    Gastroenterology Consultation  Referring Provider:     [name], MD Primary Care Physician:  [name], MD Primary Gastroenterologist:  Dr. [name] Reason for Consultation:     Chronic diarrhea        HPI:   Tracy Dixon is a 80 y.o. female referred by Dr. [name], MD  for consultation & management of chronic diarrhea.  Patient has been experiencing 2-3 nonbloody bowel movements daily, predominantly postprandial associated with abdominal cramps.  This has been ongoing for approximately 1 year.  She denies any abdominal bloating, rectal bleeding, weight loss, loss of appetite.  She is retired, works Retail buyer for Regulatory affairs officer.  She had anxiety during the pandemic.  She lives with her husband.  She reports last week she had mac & cheese, next day she had 2-3 soft bowel movements.  Patient is functionally independent, does not feel like she is 80 year old  She does not smoke or drink alcohol  NSAIDs: None  Antiplts/Anticoagulants/Anti thrombotics: None  GI Procedures: Colonoscopy in 2008 normal, Cologuard in 2018 negative  Past Medical History:  Diagnosis Date  . 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 Allergy   . Arthritis   . Asthma   . Complication of anesthesia    stays sleepy  . Diverticulosis of colon   . Gallstones   . Hyperlipidemia   . Hypertension   . Osteoarthritis   . Symptomatic mammary hypertrophy     Past Surgical History:  Procedure Laterality Date  . Bilateral tubal ligation  2005  . BREAST REDUCTION SURGERY Bilateral 06/04/2018   Procedure: MAMMARY REDUCTION  (BREAST);  Surgeon: Wallace Going, DO;  Location: [facility];  Service: Plastics;  Laterality: Bilateral;  . Carotid doppler  07/2006   Neg  . CESAREAN SECTION  2005  . CHOLECYSTECTOMY  1982   . DECOMPRESSIVE LUMBAR LAMINECTOMY LEVEL 3  06/10/2013   Procedure: DECOMPRESSIVE LUMBAR LAMINECTOMY LEVEL 3;  Surgeon: Melina Schools, MD;  Location: [facility] [place];  Service: Orthopedics;;  lumbar three to five decompression  . MRI brain  2007   acoustic neuroma (check yearly with MRI)  . PFT's  12/2006   (-) FEV1/FVC 79%, FEV1 84%  . REDUCTION MAMMAPLASTY Bilateral 05/2018  . REPLACEMENT TOTAL KNEE BILATERAL  2005  . TONSILLECTOMY  2005  . TUBAL LIGATION      Current Outpatient Medications:  .  albuterol (VENTOLIN HFA) 108 (90 Base) MCG/ACT inhaler, Inhale 1-2 puffs into the lungs every 6 (six) hours as needed for shortness of breath (for use when in contact with dogs or cats.)., Disp: 6.7 g, Rfl: 0 .  amLODipine (NORVASC) 5 MG tablet, Take 1 tablet (5 mg total) by mouth daily., Disp: 30 tablet, Rfl: 3 .  atorvastatin (LIPITOR) 20 MG tablet, TAKE 1 TABLET BY MOUTH  DAILY, Disp: 90 tablet, Rfl: 3 .  cetirizine (ZYRTEC) 10 MG tablet, Take 10 mg by mouth daily as needed for allergies. Reported on 01/10/2016, Disp: , Rfl:  .  cholecalciferol (VITAMIN D) 1000 UNITS tablet, Take 1,000 Units by mouth daily., Disp: , Rfl:  .  gabapentin (NEURONTIN) 300 MG capsule, Take 1 capsule by mouth 2 (two) times daily., Disp: , Rfl:  .  hydrochlorothiazide (MICROZIDE) 12.5 MG capsule, TAKE 1 CAPSULE  BY MOUTH  DAILY, Disp: 90 capsule, Rfl: 3 .  omeprazole (PRILOSEC OTC) 20 MG tablet, Take 20 mg by mouth daily., Disp: , Rfl:    Family History  Problem Relation Age of Onset  . Cancer Mother        Lung CA  . Stroke Mother        CVA  . Cancer Father        Prostate  . Heart disease Neg Hx   . Breast cancer Neg Hx      Social History   Tobacco Use  . Smoking status: Former Smoker    Years: 15.00    Types: Cigarettes    Quit date: 06/02/1973    Years since quitting: 47.8  . Smokeless tobacco: Never Used  . Tobacco comment: few cigs/day  Vaping Use  . Vaping Use: Never used  Substance Use Topics  .  Alcohol use: No  . Drug use: No    Allergies as of 03/14/2021 - Review Complete 03/14/2021  Allergen Reaction Noted  . Contrast media [iodinated diagnostic agents] Hives 05/26/2013  . Crab [shellfish allergy] Hives 05/26/2013    Review of Systems:    All systems reviewed and negative except where noted in HPI.   Physical Exam:  BP 135/87 (BP Location: Left Arm, Patient Position: Sitting, Cuff Size: Normal)   Pulse 93   Temp (!) 97.3 F (36.3 C) (Oral)   Ht _0  (1.549 m)   Wt 176 lb (79.8 kg)   BMI 33.25 kg/m  No LMP recorded. Patient is postmenopausal.  General:   Alert,  Well-developed, well-nourished, pleasant and cooperative in NAD Head:  Normocephalic and atraumatic. Eyes:  Sclera clear, no icterus.   Conjunctiva pink. Ears:  Normal auditory acuity. Nose:  No deformity, discharge, or lesions. Mouth:  No deformity or lesions,oropharynx pink & moist. Neck:  Supple; no masses or thyromegaly. Lungs:  Respirations even and unlabored.  Clear throughout to auscultation.   No wheezes, crackles, or rhonchi. No acute distress. Heart:  Regular rate and rhythm; no murmurs, clicks, rubs, or gallops. Abdomen:  Normal bowel sounds. Soft, non-tender and non-distended without masses, hepatosplenomegaly or hernias noted.  No guarding or rebound tenderness.   Rectal: Not performed Msk:  Symmetrical without gross deformities. Good, equal movement & strength bilaterally. Pulses:  Normal pulses noted. Extremities:  No clubbing or edema.  No cyanosis. Neurologic:  Alert and oriented x3;  grossly normal neurologically. Skin:  Intact without significant lesions or rashes. No jaundice. Psych:  Alert and cooperative. Normal mood and affect.  Imaging Studies: Reviewed  Assessment and Plan:   Tracy Dixon is a 80 y.o. pleasant female with no significant past medical history is seen in consultation for chronic nonbloody diarrhea  Chronic nonbloody diarrhea No evidence of anemia, TSH  negative Recommend GI profile PCR to rule out infection, celiac panel, H. pylori stool antigen, pancreatic fecal elastase levels If above work-up is negative, recommend colonoscopy with TI evaluation and random biopsies to rule out microscopic colitis   Follow up in 2 to 3 months and contact via MyChart as needed   [name], MD

## 2021-03-15 ENCOUNTER — Other Ambulatory Visit: Payer: Self-pay | Admitting: Family Medicine

## 2021-03-16 ENCOUNTER — Encounter (INDEPENDENT_AMBULATORY_CARE_PROVIDER_SITE_OTHER): Payer: Self-pay | Admitting: Vascular Surgery

## 2021-03-16 ENCOUNTER — Ambulatory Visit (INDEPENDENT_AMBULATORY_CARE_PROVIDER_SITE_OTHER): Payer: Medicare Other | Admitting: Vascular Surgery

## 2021-03-16 ENCOUNTER — Other Ambulatory Visit: Payer: Self-pay

## 2021-03-16 VITALS — BP 141/81 | HR 88 | Resp 16 | Wt 174.6 lb

## 2021-03-16 DIAGNOSIS — I8312 Varicose veins of left lower extremity with inflammation: Secondary | ICD-10-CM | POA: Diagnosis not present

## 2021-03-16 DIAGNOSIS — I8390 Asymptomatic varicose veins of unspecified lower extremity: Secondary | ICD-10-CM

## 2021-03-16 NOTE — Telephone Encounter (Signed)
Spoke with Tracy Dixon.  She is still taking the Amlodipine 5 mg daily but states she does not need a refill at this time.

## 2021-03-16 NOTE — Telephone Encounter (Signed)
Call pt to verify... did she stop the dose  as discussed on 01/10/2021 or is she still taking the amlodipine 5 mg daily?

## 2021-03-16 NOTE — Telephone Encounter (Signed)
Per MyChart message on 01/10/2021 you wanted to stop amlodipine but it is still listed on medication list??  Please advise

## 2021-03-17 ENCOUNTER — Other Ambulatory Visit (INDEPENDENT_AMBULATORY_CARE_PROVIDER_SITE_OTHER): Payer: Self-pay | Admitting: Vascular Surgery

## 2021-03-17 DIAGNOSIS — I8312 Varicose veins of left lower extremity with inflammation: Secondary | ICD-10-CM

## 2021-03-17 LAB — CELIAC DISEASE PANEL
Endomysial IgA: NEGATIVE
IgA/Immunoglobulin A, Serum: 747 mg/dL — ABNORMAL HIGH (ref 64–422)
Transglutaminase IgA: <2 U/mL (ref 0–3)

## 2021-03-19 ENCOUNTER — Encounter (INDEPENDENT_AMBULATORY_CARE_PROVIDER_SITE_OTHER): Payer: Self-pay | Admitting: Vascular Surgery

## 2021-03-19 ENCOUNTER — Encounter: Payer: Self-pay | Admitting: Gastroenterology

## 2021-03-19 NOTE — Progress Notes (Signed)
    MRN : [id number]  MIYEKO MAHLUM is a 80 y.o. (Apr 15, 1941) female who presents with chief complaint of painful varicose veins.    The patient's left lower extremity was sterilely prepped and draped.  The ultrasound machine was used to visualize the left great saphenous vein throughout its course.  A segment at mid thigh and below the knee was selected for access.  The saphenous vein was accessed without difficulty using ultrasound guidance with a micropuncture needle.   An 0.018  wire was placed beyond the saphenofemoral junction through the sheath and the microneedle was removed.  The 65 cm sheath was then placed over the wire and the wire and dilator were removed.  The laser fiber was placed through the sheath and its tip was placed approximately 2 cm below the saphenofemoral junction.  Tumescent anesthesia was then created with a dilute lidocaine solution.  Laser energy was then delivered with constant withdrawal of the sheath and laser fiber.  Approximately 1240 Joules of energy were delivered over a length of 25 cm.  Sterile dressings were placed.  The patient tolerated the procedure well without complications.

## 2021-03-20 ENCOUNTER — Other Ambulatory Visit (INDEPENDENT_AMBULATORY_CARE_PROVIDER_SITE_OTHER): Payer: Medicare Other | Admitting: Vascular Surgery

## 2021-03-20 ENCOUNTER — Other Ambulatory Visit: Payer: Self-pay

## 2021-03-20 ENCOUNTER — Ambulatory Visit (INDEPENDENT_AMBULATORY_CARE_PROVIDER_SITE_OTHER): Payer: Medicare Other

## 2021-03-20 DIAGNOSIS — I8312 Varicose veins of left lower extremity with inflammation: Secondary | ICD-10-CM

## 2021-03-23 DIAGNOSIS — K529 Noninfective gastroenteritis and colitis, unspecified: Secondary | ICD-10-CM | POA: Diagnosis not present

## 2021-03-27 ENCOUNTER — Telehealth: Payer: Self-pay

## 2021-03-27 ENCOUNTER — Encounter: Payer: Self-pay | Admitting: Gastroenterology

## 2021-03-27 MED ORDER — CIPROFLOXACIN HCL 500 MG PO TABS: 500.0000 mg | ORAL_TABLET | Freq: Two times a day (BID) | ORAL | 0 refills | Status: AC

## 2021-03-27 NOTE — Telephone Encounter (Signed)
Patient verbalized understanding of results. Patient will go pick up medication

## 2021-03-27 NOTE — Telephone Encounter (Signed)
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  Recommend ciprofloxacin 500 mg twice daily for 5 days.  She should inform us if her diarrhea is persistent after the antibiotic course  Tracy Dixon

## 2021-03-28 ENCOUNTER — Ambulatory Visit (INDEPENDENT_AMBULATORY_CARE_PROVIDER_SITE_OTHER): Payer: Medicare Other | Admitting: Podiatry

## 2021-03-28 ENCOUNTER — Other Ambulatory Visit: Payer: Self-pay

## 2021-03-28 ENCOUNTER — Encounter: Payer: Self-pay | Admitting: Podiatry

## 2021-03-28 DIAGNOSIS — B351 Tinea unguium: Secondary | ICD-10-CM | POA: Diagnosis not present

## 2021-03-28 DIAGNOSIS — Q828 Other specified congenital malformations of skin: Secondary | ICD-10-CM

## 2021-03-28 DIAGNOSIS — M79675 Pain in left toe(s): Secondary | ICD-10-CM

## 2021-03-28 DIAGNOSIS — M79674 Pain in right toe(s): Secondary | ICD-10-CM | POA: Diagnosis not present

## 2021-03-28 DIAGNOSIS — M79671 Pain in right foot: Secondary | ICD-10-CM | POA: Diagnosis not present

## 2021-03-29 LAB — GI PROFILE, STOOL, PCR

## 2021-03-29 LAB — H. PYLORI ANTIGEN, STOOL: H pylori Ag, Stl: NEGATIVE

## 2021-03-29 LAB — PANCREATIC ELASTASE, FECAL: Pancreatic Elastase, Fecal: 275 ug Elast./g (ref >200)

## 2021-04-02 NOTE — Progress Notes (Signed)
  Subjective:  Patient ID: Tracy Dixon, female    DOB: May 17, 1941,  MRN: [id number]  80 y.o. female presents painful porokeratotic lesion(s) right foot and painful mycotic toenails that limit ambulation. Painful toenails interfere with ambulation. Aggravating factors include wearing enclosed shoe gear. Pain is relieved with periodic professional debridement. Painful porokeratotic lesions are aggravated when weightbearing with and without shoegear. Pain is relieved with periodic professional debridement..  Pain interferes with ambulation. Aggravating factors include wearing enclosed shoe gear. Pain is relieved with periodic professional debridement.  Allergies  Allergen Reactions  . Contrast Media [Iodinated Diagnostic Agents] Hives  . Crab [Shellfish Allergy] Hives    Review of Systems: Negative except as noted in the HPI.   Objective:   Constitutional Pt is a pleasant 80 y.o. African American female WD, WN in NAD. AAO x 3.   Vascular Neurovascular status intact b/l lower extremities. No pain with calf compression b/l. Varicosities present b/l.  Neurologic Protective sensation intact 5/5 intact bilaterally with 10g monofilament b/l.  Dermatologic Pedal skin with normal turgor, texture and tone bilaterally. No open wounds bilaterally. No interdigital macerations bilaterally. Toenails 1-5 b/l elongated, discolored, dystrophic, thickened, crumbly with subungual debris and tenderness to dorsal palpation. Porokeratotic lesion(s) submet head 3 right foot. No erythema, no edema, no drainage, no fluctuance.  Orthopedic: Normal muscle strength 5/5 to all lower extremity muscle groups bilaterally. No pain crepitus or joint limitation noted with ROM b/l. Hammertoes noted to the 2-5 bilaterally.   Radiographs: None Assessment:   1. Pain due to onychomycosis of toenails of both feet   2. Porokeratosis   3. Right foot pain    Plan:  Patient was evaluated and treated and all questions  answered.  Onychomycosis with pain -Nails palliatively debridement as below. -Educated on self-care  Procedure: Nail Debridement Rationale: Pain Type of Debridement: manual, sharp debridement. Instrumentation: Nail nipper, rotary burr. Number of Nails: 10  -Examined patient. -No new findings. No new orders. -Patient to continue soft, supportive shoe gear daily. -Toenails 1-5 b/l were debrided in length and girth with sterile nail nippers and dremel without iatrogenic bleeding.  -Painful porokeratotic lesion(s) submet head 3 right foot pared and enucleated with sterile scalpel blade without incident. -Patient to report any pedal injuries to medical professional immediately. -Patient/POA to call should there be question/concern in the interim.  Return in about 3 months (around 06/28/2021).  Marzetta Board, DPM

## 2021-04-10 DIAGNOSIS — M545 Low back pain, unspecified: Secondary | ICD-10-CM | POA: Diagnosis not present

## 2021-04-10 DIAGNOSIS — M961 Postlaminectomy syndrome, not elsewhere classified: Secondary | ICD-10-CM | POA: Diagnosis not present

## 2021-04-10 DIAGNOSIS — M5136 Other intervertebral disc degeneration, lumbar region: Secondary | ICD-10-CM | POA: Diagnosis not present

## 2021-04-17 ENCOUNTER — Encounter (INDEPENDENT_AMBULATORY_CARE_PROVIDER_SITE_OTHER): Payer: Self-pay | Admitting: Vascular Surgery

## 2021-04-17 ENCOUNTER — Ambulatory Visit (INDEPENDENT_AMBULATORY_CARE_PROVIDER_SITE_OTHER): Payer: Medicare Other | Admitting: Vascular Surgery

## 2021-04-17 ENCOUNTER — Other Ambulatory Visit: Payer: Self-pay

## 2021-04-17 VITALS — BP 127/78 | HR 99 | Ht 61.0 in | Wt 177.0 lb

## 2021-04-17 DIAGNOSIS — I83819 Varicose veins of unspecified lower extremities with pain: Secondary | ICD-10-CM | POA: Diagnosis not present

## 2021-04-17 DIAGNOSIS — E7800 Pure hypercholesterolemia, unspecified: Secondary | ICD-10-CM

## 2021-04-17 DIAGNOSIS — I10 Essential (primary) hypertension: Secondary | ICD-10-CM | POA: Diagnosis not present

## 2021-04-17 DIAGNOSIS — I872 Venous insufficiency (chronic) (peripheral): Secondary | ICD-10-CM | POA: Diagnosis not present

## 2021-04-17 NOTE — Progress Notes (Signed)
MRN : [id number]  Tracy Dixon is a 80 y.o. (10-09-1941) female who presents with chief complaint of  Chief Complaint  Patient presents with  . Follow-up    4 week post laser  .  History of Present Illness:   The patient returns to the office for followup status post laser ablation of the left great saphenous vein on 03/16/2021.  The patient note significant improvement in the lower extremity pain but not resolution of the symptoms. The patient notes multiple residual varicosities bilaterally which continued to hurt with dependent positions and remained tender to palpation. The patient's swelling is minimally from preoperative status. The patient continues to wear graduated compression stockings on a daily basis but these are not eliminating the pain and discomfort. The patient continues to use over-the-counter anti-inflammatory medications to treat the pain and related symptoms but this has not given the patient relief. The patient notes the pain in the lower extremities is causing problems with daily exercise, problems at work and even with household activities such as preparing meals and doing dishes.  The patient is otherwise done well and there have been no complications related to the laser procedure or interval changes in the patient's overall   Post laser ultrasound shows successful ablation of the left great saphenous vein    Current Meds  Medication Sig  . albuterol (VENTOLIN HFA) 108 (90 Base) MCG/ACT inhaler Inhale 1-2 puffs into the lungs every 6 (six) hours as needed for shortness of breath (for use when in contact with dogs or cats.).  Marland Kitchen ALPRAZolam (XANAX) 0.5 MG tablet SMARTSIG:1 Tablet(s) By Mouth  . amLODipine (NORVASC) 5 MG tablet Take 1 tablet (5 mg total) by mouth daily.  Marland Kitchen atorvastatin (LIPITOR) 20 MG tablet TAKE 1 TABLET BY MOUTH  DAILY  . cetirizine (ZYRTEC) 10 MG tablet Take 10 mg by mouth daily as needed for allergies. Reported on 01/10/2016  . cholecalciferol  (VITAMIN D) 1000 UNITS tablet Take 1,000 Units by mouth daily.  Marland Kitchen gabapentin (NEURONTIN) 300 MG capsule Take 1 capsule by mouth 2 (two) times daily.  . hydrochlorothiazide (MICROZIDE) 12.5 MG capsule TAKE 1 CAPSULE BY MOUTH  DAILY  . omeprazole (PRILOSEC OTC) 20 MG tablet Take 20 mg by mouth daily.    Past Medical History:  Diagnosis Date  . 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)    followed by [facility], dx in [place]  . Allergy   . Arthritis   . Asthma   . Complication of anesthesia    stays sleepy  . Diverticulosis of colon   . Gallstones   . Hyperlipidemia   . Hypertension   . Osteoarthritis   . Symptomatic mammary hypertrophy     Past Surgical History:  Procedure Laterality Date  . Bilateral tubal ligation  2005  . BREAST REDUCTION SURGERY Bilateral 06/04/2018   Procedure: MAMMARY REDUCTION  (BREAST);  Surgeon: Wallace Going, DO;  Location: [place];  Service: Plastics;  Laterality: Bilateral;  . Carotid doppler  07/2006   Neg  . CESAREAN SECTION  2005  . CHOLECYSTECTOMY  1982  . DECOMPRESSIVE LUMBAR LAMINECTOMY LEVEL 3  06/10/2013   Procedure: DECOMPRESSIVE LUMBAR LAMINECTOMY LEVEL 3;  Surgeon: Melina Schools, MD;  Location: [place];  Service: Orthopedics;;  lumbar three to five decompression  . MRI brain  2007   acoustic neuroma (check yearly with MRI)  . PFT's  12/2006   (-) FEV1/FVC 79%, FEV1 84%  . REDUCTION MAMMAPLASTY Bilateral 05/2018  .  REPLACEMENT TOTAL KNEE BILATERAL  2005  . TONSILLECTOMY  2005  . TUBAL LIGATION      Social History Social History   Tobacco Use  . Smoking status: Former Smoker    Years: 15.00    Types: Cigarettes    Quit date: 06/02/1973    Years since quitting: 47.9  . Smokeless tobacco: Never Used  . Tobacco comment: few cigs/day  Vaping Use  . Vaping Use: Never used  Substance Use Topics  . Alcohol use: No  . Drug use: No    Family History Family History  Problem Relation Age of Onset  . Cancer Mother        Lung  CA  . Stroke Mother        CVA  . Cancer Father        Prostate  . Heart disease Neg Hx   . Breast cancer Neg Hx     Allergies  Allergen Reactions  . Contrast Media [Iodinated Diagnostic Agents] Hives  . Crab [Shellfish Allergy] Hives     REVIEW OF SYSTEMS (Negative unless checked)  Constitutional: [] Weight loss  [] Fever  [] Chills Cardiac: [] Chest pain   [] Chest pressure   [] Palpitations   [] Shortness of breath when laying flat   [] Shortness of breath with exertion. Vascular:  [] Pain in legs with walking   [] Pain in legs at rest  [] History of DVT   [] Phlebitis   [x] Swelling in legs   [x] Varicose veins   [] Non-healing ulcers Pulmonary:   [] Uses home oxygen   [] Productive cough   [] Hemoptysis   [] Wheeze  [] COPD   [] Asthma Neurologic:  [] Dizziness   [] Seizures   [] History of stroke   [] History of TIA  [] Aphasia   [] Vissual changes   [] Weakness or numbness in arm   [] Weakness or numbness in leg Musculoskeletal:   [] Joint swelling   [] Joint pain   [] Low back pain Hematologic:  [] Easy bruising  [] Easy bleeding   [] Hypercoagulable state   [] Anemic Gastrointestinal:  [] Diarrhea   [] Vomiting  [x] Gastroesophageal reflux/heartburn   [] Difficulty swallowing. Genitourinary:  [] Chronic kidney disease   [] Difficult urination  [] Frequent urination   [] Blood in urine Skin:  [] Rashes   [] Ulcers  Psychological:  [] History of anxiety   []  History of major depression.  Physical Examination  Vitals:   04/17/21 1105  BP: 127/78  Pulse: 99  Weight: 177 lb (80.3 kg)  Height: 5\' 1"  (1.549 m)   Body mass index is 33.44 kg/m. Gen: WD/WN, NAD Head: [place]/AT, No temporalis wasting.  Ear/Nose/Throat: Hearing grossly intact, nares w/o erythema or drainage Eyes: PER, EOMI, sclera nonicteric.  Neck: Supple, no large masses.   Pulmonary:  Good air movement, no audible wheezing bilaterally, no use of accessory muscles.  Cardiac: RRR, no JVD Vascular: Large varicosities present on the left.  Moderate venous  stasis changes to the legs bilaterally.  2-3+ soft pitting edema. Vessel Right Left  Radial Palpable Palpable  Gastrointestinal: Non-distended. No guarding/no peritoneal signs.  Musculoskeletal: M/S 5/5 throughout.  No deformity or atrophy.  Neurologic: CN 2-12 intact. Symmetrical.  Speech is fluent. Motor exam as listed above. Psychiatric: Judgment intact, Mood & affect appropriate for pt's clinical situation. Dermatologic: Moderate venous rashes no ulcers noted.  No changes consistent with cellulitis. Lymph : Mild lichenification / skin changes of chronic lymphedema.  CBC Lab Results  Component Value Date   WBC 7.2 04/20/2019   HGB 13.6 04/20/2019   HCT 42.6 04/20/2019   MCV 78.5 (L) 04/20/2019  PLT 231 04/20/2019    BMET    Component Value Date/Time   NA 137 12/29/2020 1114   K 3.3 (L) 12/29/2020 1114   CL 97 12/29/2020 1114   CO2 35 (H) 12/29/2020 1114   GLUCOSE 85 12/29/2020 1114   BUN 15 12/29/2020 1114   CREATININE 0.77 12/29/2020 1114   CALCIUM 9.3 12/29/2020 1114   GFRNONAA >60 04/20/2019 1054   GFRAA >60 04/20/2019 1054   CrCl cannot be calculated (Patient's most recent lab result is older than the maximum 21 days allowed.).  COAG Lab Results  Component Value Date   INR 1.1 ratio (H) 03/30/2009    Radiology VAS [name] LOWER EXTREMITY VENOUS POST ABLATION  Result Date: 03/20/2021  Lower Venous Study Patient Name:  MERLINA MARCHENA  Date of Exam:   03/20/2021 Medical Rec #: [id number]        Accession #:    [id number] Date of Birth: 05/28/1941        Patient Gender: F Patient Age:   80Y Exam Location:  [facility] Procedure:      VAS [name] LASER ABLATION OF SUPERFICIAL VEIN Referring Phys: [id number] Dolores Lory Lynn --------------------------------------------------------------------------------  Indications: Post ablation Left.  Performing Technologist: Charlane Ferretti RT (R)(VS)  Examination Guidelines: A complete evaluation includes B-mode imaging, spectral  Doppler, color Doppler, and power Doppler as needed of all accessible portions of each vessel. Bilateral testing is considered an integral part of a complete examination. Limited examinations for reoccurring indications may be performed as noted.  +---------+---------------+---------+-----------+----------+--------------+ LEFT     CompressibilityPhasicitySpontaneityPropertiesThrombus Aging +---------+---------------+---------+-----------+----------+--------------+ CFV      Full                                                        +---------+---------------+---------+-----------+----------+--------------+ SFJ      Full                                                        +---------+---------------+---------+-----------+----------+--------------+ FV Prox  Full                                                        +---------+---------------+---------+-----------+----------+--------------+ FV Mid   Full                                                        +---------+---------------+---------+-----------+----------+--------------+ FV DistalFull                                                        +---------+---------------+---------+-----------+----------+--------------+ POP      Full                                                        +---------+---------------+---------+-----------+----------+--------------+  GSV      None                                                        +---------+---------------+---------+-----------+----------+--------------+ SSV      Full                                                        +---------+---------------+---------+-----------+----------+--------------+  Summary: Left: Left GSV is not compressable from knee to approximatelt 1.70 cm from SFJ. Successful vein cosure.  *See table(s) above for measurements and observations. Electronically signed by Hortencia Pilar MD on 03/20/2021 at 5:17:02 PM.    Final       Assessment/Plan 1. Varicose veins with pain Recommend:  The patient has had successful ablation of the previously incompetent saphenous venous system but still has persistent symptoms of pain and swelling that are having a negative impact on daily life and daily activities.  Patient should undergo injection sclerotherapy to treat the residual varicosities.  The risks, benefits and alternative therapies were reviewed in detail with the patient.  All questions were answered.  The patient agrees to proceed with sclerotherapy at their convenience.  The patient will continue wearing the graduated compression stockings and using the over-the-counter pain medications to treat her symptoms.     2. Chronic venous insufficiency No surgery or intervention at this point in time.    I have had a long discussion with the patient regarding venous insufficiency and why it  causes symptoms. I have discussed with the patient the chronic skin changes that accompany venous insufficiency and the long term sequela such as infection and ulceration.  Patient will begin wearing graduated compression stockings class 1 (20-30 mmHg) or compression wraps on a daily basis a prescription was given. The patient will put the stockings on first thing in the morning and removing them in the evening. The patient is instructed specifically not to sleep in the stockings.    In addition, behavioral modification including several periods of elevation of the lower extremities during the day will be continued. I have demonstrated that proper elevation is a position with the ankles at heart level.  The patient is instructed to begin routine exercise, especially walking on a daily basis   3. Essential hypertension, benign Continue antihypertensive medications as already ordered, these medications have been reviewed and there are no changes at this time.   4. High cholesterol Continue statin as ordered and reviewed, no  changes at this time    Hortencia Pilar, MD  04/17/2021 11:20 AM

## 2021-04-25 ENCOUNTER — Encounter: Payer: Self-pay | Admitting: Gastroenterology

## 2021-04-25 DIAGNOSIS — K529 Noninfective gastroenteritis and colitis, unspecified: Secondary | ICD-10-CM

## 2021-04-26 ENCOUNTER — Other Ambulatory Visit: Payer: Self-pay | Admitting: Gastroenterology

## 2021-04-28 ENCOUNTER — Other Ambulatory Visit: Payer: Self-pay | Admitting: Family Medicine

## 2021-05-10 ENCOUNTER — Other Ambulatory Visit: Payer: Self-pay

## 2021-05-10 ENCOUNTER — Ambulatory Visit (INDEPENDENT_AMBULATORY_CARE_PROVIDER_SITE_OTHER): Payer: Medicare Other | Admitting: Vascular Surgery

## 2021-05-10 ENCOUNTER — Ambulatory Visit (INDEPENDENT_AMBULATORY_CARE_PROVIDER_SITE_OTHER): Payer: Medicare Other | Admitting: Nurse Practitioner

## 2021-05-10 ENCOUNTER — Encounter (INDEPENDENT_AMBULATORY_CARE_PROVIDER_SITE_OTHER): Payer: Self-pay | Admitting: Nurse Practitioner

## 2021-05-10 VITALS — BP 137/76 | HR 84 | Resp 16 | Wt 178.2 lb

## 2021-05-10 DIAGNOSIS — I83813 Varicose veins of bilateral lower extremities with pain: Secondary | ICD-10-CM

## 2021-05-10 DIAGNOSIS — I83819 Varicose veins of unspecified lower extremities with pain: Secondary | ICD-10-CM

## 2021-05-15 ENCOUNTER — Encounter (INDEPENDENT_AMBULATORY_CARE_PROVIDER_SITE_OTHER): Payer: Self-pay | Admitting: Nurse Practitioner

## 2021-05-15 NOTE — Progress Notes (Signed)
Varicose veins of left lower extremity with inflammation (454.1  I83.10) Current Plans   Indication: Patient presents with symptomatic varicose veins of the left lower extremity.   Procedure: Sclerotherapy using hypertonic saline mixed with 1% Lidocaine was performed on the left lower extremity. Compression wraps were placed. The patient tolerated the procedure well. 

## 2021-05-16 ENCOUNTER — Encounter: Payer: Self-pay | Admitting: Gastroenterology

## 2021-05-16 ENCOUNTER — Other Ambulatory Visit: Payer: Self-pay

## 2021-05-16 ENCOUNTER — Ambulatory Visit (INDEPENDENT_AMBULATORY_CARE_PROVIDER_SITE_OTHER): Payer: Medicare Other | Admitting: Gastroenterology

## 2021-05-16 VITALS — BP 134/75 | HR 98 | Temp 97.6°F | Ht 61.0 in | Wt 177.0 lb

## 2021-05-16 DIAGNOSIS — A046 Enteritis due to Yersinia enterocolitica: Secondary | ICD-10-CM

## 2021-05-16 NOTE — Progress Notes (Signed)
Tracy Darby, MD [place]  [place], [place] [address]  Main: [phone]  Fax: [phone]    Gastroenterology Consultation  Referring Provider:     [name], MD Primary Care Physician:  Tracy Sanders, MD Primary Gastroenterologist:  Dr. Cephas Dixon Reason for Consultation:     Chronic diarrhea        HPI:   Tracy Dixon is a 80 y.o. female referred by Dr. [name], MD  for consultation & management of chronic diarrhea.  Patient has been experiencing 2-3 nonbloody bowel movements daily, predominantly postprandial associated with abdominal cramps.  This has been ongoing for approximately 1 year.  She denies any abdominal bloating, rectal bleeding, weight loss, loss of appetite.  She is retired, works Retail buyer for [employer].  She had anxiety during the pandemic.  She lives with her husband.  She reports last week she had mac & cheese, next day she had 2-3 soft bowel movements.  Patient is functionally independent, does not feel like she is 80 year old  She does not smoke or drink alcohol  Follow-up visit 05/16/2021 Patient underwent stool studies for work-up of chronic diarrhea, found to have worsening enterocolitis infection.  She was treated with 5 days course of ciprofloxacin.  Pancreatic fecal elastase levels were normal, H. pylori stool antigen was normal.  Celiac disease panel revealed normal TTG IgA levels.  Patient's diarrhea has resolved since then.  She is very pleased with this improvement.  She is currently having 1 soft bowel movement daily.  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[facility])    followed by [facility], dx in [place]   Allergy    Arthritis    Asthma    Complication of anesthesia    stays sleepy   Diverticulosis of colon    Gallstones    Hyperlipidemia     Hypertension    Osteoarthritis    Symptomatic mammary hypertrophy     Past Surgical History:  Procedure Laterality Date   Bilateral tubal ligation  2005   BREAST REDUCTION SURGERY Bilateral 06/04/2018   Procedure: MAMMARY REDUCTION  (BREAST);  Surgeon: Wallace Going, DO;  Location: [place];  Service: Plastics;  Laterality: Bilateral;   Carotid doppler  07/2006   Neg   CESAREAN SECTION  2005   CHOLECYSTECTOMY  1982   DECOMPRESSIVE LUMBAR LAMINECTOMY LEVEL 3  06/10/2013   Procedure: DECOMPRESSIVE LUMBAR LAMINECTOMY LEVEL 3;  Surgeon: Melina Schools, MD;  Location: [place];  Service: Orthopedics;;  lumbar three to five decompression   MRI brain  2007   acoustic neuroma (check yearly with MRI)   PFT's  12/2006   (-) FEV1/FVC 79%, FEV1 84%   REDUCTION MAMMAPLASTY Bilateral 05/2018   REPLACEMENT TOTAL KNEE BILATERAL  2005   TONSILLECTOMY  2005   TUBAL LIGATION      Current Outpatient Medications:    albuterol (VENTOLIN HFA) 108 (90 Base) MCG/ACT inhaler, Inhale 1-2 puffs into the lungs every 6 (six) hours as needed for shortness of breath (for use when in contact with dogs or cats.)., Disp: 6.7 g, Rfl: 0   amLODipine (NORVASC) 5 MG tablet, TAKE 1 TABLET BY MOUTH  DAILY, Disp: 90 tablet, Rfl: 1   atorvastatin (LIPITOR) 20 MG tablet, TAKE 1 TABLET BY MOUTH  DAILY, Disp: 90 tablet, Rfl:  3   cetirizine (ZYRTEC) 10 MG tablet, Take 10 mg by mouth daily as needed for allergies. Reported on 01/10/2016, Disp: , Rfl:    cholecalciferol (VITAMIN D) 1000 UNITS tablet, Take 1,000 Units by mouth daily., Disp: , Rfl:    gabapentin (NEURONTIN) 300 MG capsule, Take 1 capsule by mouth 2 (two) times daily., Disp: , Rfl:    hydrochlorothiazide (MICROZIDE) 12.5 MG capsule, TAKE 1 CAPSULE BY MOUTH  DAILY, Disp: 90 capsule, Rfl: 3   omeprazole (PRILOSEC OTC) 20 MG tablet, Take 20 mg by mouth daily., Disp: , Rfl:    Family History  Problem Relation Age of Onset   Cancer Mother         Lung CA   Stroke Mother        CVA   Cancer Father        Prostate   Heart disease Neg Hx    Breast cancer Neg Hx      Social History   Tobacco Use   Smoking status: Former    Years: 15.00    Pack years: 0.00    Types: Cigarettes    Quit date: 06/02/1973    Years since quitting: 47.9   Smokeless tobacco: Never   Tobacco comments:    few cigs/day  Vaping Use   Vaping Use: Never used  Substance Use Topics   Alcohol use: No   Drug use: No    Allergies as of 05/16/2021 - Review Complete 05/16/2021  Allergen Reaction Noted   Contrast media [iodinated diagnostic agents] Hives 05/26/2013   Crab [shellfish allergy] Hives 05/26/2013    Review of Systems:    All systems reviewed and negative except where noted in HPI.   Physical Exam:  BP 134/75 (BP Location: Left Arm, Patient Position: Sitting, Cuff Size: Large)   Pulse 98   Temp 97.6 F (36.4 C) (Oral)   Ht _0  (1.549 m)   Wt 177 lb (80.3 kg)   BMI 33.44 kg/m  No LMP recorded. Patient is postmenopausal.  General:   Alert,  Well-developed, well-nourished, pleasant and cooperative in NAD Head:  Normocephalic and atraumatic. Eyes:  Sclera clear, no icterus.   Conjunctiva pink. Ears:  Normal auditory acuity. Nose:  No deformity, discharge, or lesions. Mouth:  No deformity or lesions,oropharynx pink & moist. Neck:  Supple; no masses or thyromegaly. Lungs:  Respirations even and unlabored.  Clear throughout to auscultation.   No wheezes, crackles, or rhonchi. No acute distress. Heart:  Regular rate and rhythm; no murmurs, clicks, rubs, or gallops. Abdomen:  Normal bowel sounds. Soft, non-tender and non-distended without masses, hepatosplenomegaly or hernias noted.  No guarding or rebound tenderness.   Rectal: Not performed Msk:  Symmetrical without gross deformities. Good, equal movement & strength bilaterally. Pulses:  Normal pulses noted. Extremities:  No clubbing or edema.  No cyanosis. Neurologic:  Alert and  oriented x3;  grossly normal neurologically. Skin:  Intact without significant lesions or rashes. No jaundice. Psych:  Alert and cooperative. Normal mood and affect.  Imaging Studies: Reviewed  Assessment and Plan:   VOLA BENEKE is a 80 y.o. pleasant female with no significant past medical history is seen in for follow-up of chronic nonbloody diarrhea.  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  Patient's diarrhea has currently resolved No evidence of anemia, TSH negative, celiac panel, H. pylori stool antigen, pancreatic fecal elastase levels are normal.  If patient's diarrhea recurs, can recheck  stool studies and possibly colonoscopy with TI evaluation and random biopsies to rule out microscopic colitis   Follow up as needed or contact via MyChart as needed   Tracy Darby, MD

## 2021-05-20 ENCOUNTER — Other Ambulatory Visit: Payer: Self-pay | Admitting: Family Medicine

## 2021-05-22 ENCOUNTER — Other Ambulatory Visit: Payer: Self-pay | Admitting: Family Medicine

## 2021-06-12 DIAGNOSIS — M5412 Radiculopathy, cervical region: Secondary | ICD-10-CM | POA: Diagnosis not present

## 2021-06-12 DIAGNOSIS — M542 Cervicalgia: Secondary | ICD-10-CM | POA: Diagnosis not present

## 2021-06-12 DIAGNOSIS — M961 Postlaminectomy syndrome, not elsewhere classified: Secondary | ICD-10-CM | POA: Diagnosis not present

## 2021-06-12 DIAGNOSIS — M79642 Pain in left hand: Secondary | ICD-10-CM | POA: Diagnosis not present

## 2021-06-14 ENCOUNTER — Other Ambulatory Visit: Payer: Self-pay

## 2021-06-14 ENCOUNTER — Encounter (INDEPENDENT_AMBULATORY_CARE_PROVIDER_SITE_OTHER): Payer: Self-pay | Admitting: Vascular Surgery

## 2021-06-14 ENCOUNTER — Ambulatory Visit (INDEPENDENT_AMBULATORY_CARE_PROVIDER_SITE_OTHER): Payer: Medicare Other | Admitting: Vascular Surgery

## 2021-06-14 VITALS — BP 145/89 | HR 88 | Ht 61.0 in | Wt 181.0 lb

## 2021-06-14 DIAGNOSIS — I872 Venous insufficiency (chronic) (peripheral): Secondary | ICD-10-CM | POA: Diagnosis not present

## 2021-06-14 NOTE — Progress Notes (Signed)
Varicose veins of left lower extremity with inflammation (454.1  I83.10) Current Plans   Indication: Patient presents with symptomatic varicose veins of the left lower extremity.   Procedure: Sclerotherapy using hypertonic saline mixed with 1% Lidocaine was performed on the left lower extremity. Compression wraps were placed. The patient tolerated the procedure well. 

## 2021-06-26 ENCOUNTER — Encounter: Payer: Self-pay | Admitting: Gastroenterology

## 2021-06-28 ENCOUNTER — Ambulatory Visit (INDEPENDENT_AMBULATORY_CARE_PROVIDER_SITE_OTHER): Payer: Medicare Other | Admitting: Podiatry

## 2021-06-28 ENCOUNTER — Other Ambulatory Visit: Payer: Self-pay

## 2021-06-28 ENCOUNTER — Encounter: Payer: Self-pay | Admitting: Podiatry

## 2021-06-28 DIAGNOSIS — M79674 Pain in right toe(s): Secondary | ICD-10-CM

## 2021-06-28 DIAGNOSIS — B351 Tinea unguium: Secondary | ICD-10-CM | POA: Diagnosis not present

## 2021-06-28 DIAGNOSIS — Q828 Other specified congenital malformations of skin: Secondary | ICD-10-CM | POA: Diagnosis not present

## 2021-06-28 DIAGNOSIS — M79675 Pain in left toe(s): Secondary | ICD-10-CM | POA: Diagnosis not present

## 2021-06-28 DIAGNOSIS — M79671 Pain in right foot: Secondary | ICD-10-CM

## 2021-06-29 DIAGNOSIS — K529 Noninfective gastroenteritis and colitis, unspecified: Secondary | ICD-10-CM | POA: Diagnosis not present

## 2021-07-01 DIAGNOSIS — M542 Cervicalgia: Secondary | ICD-10-CM | POA: Diagnosis not present

## 2021-07-02 NOTE — Progress Notes (Signed)
  Subjective:  Patient ID: Tracy Dixon, female    DOB: 19-Feb-1941,  MRN: [id number]  AVERLY MOGEL presents to clinic today for painful porokeratotic lesion(s) plantar aspect right foot and painful mycotic toenails that limit ambulation. Painful toenails interfere with ambulation. Aggravating factors include wearing enclosed shoe gear. Pain is relieved with periodic professional debridement. Painful porokeratotic lesions are aggravated when weightbearing with and without shoegear. Pain is relieved with periodic professional debridement.  She states she has a new lesion between her right 3rd and 4th digit. Denies any redness, drainage or swelling, but it is tender.  She is also concerned about a spot she noticed on her left 3rd toenail after removing her nail polish.  PCP is [name], MD , and last visit was 12/29/2020.  Allergies  Allergen Reactions   Contrast Media [Iodinated Diagnostic Agents] Hives   Crab [Shellfish Allergy] Hives    Review of Systems: Negative except as noted in the HPI. Objective:   Constitutional WHITLEE LEADINGHAM is a pleasant 80 y.o. African American female, WD, WN in NAD. AAO x 3.   Vascular Capillary refill time to digits immediate b/l. Palpable pedal pulses b/l LE. Pedal hair sparse. Lower extremity skin temperature gradient within normal limits. Lymphedema present left lower extremity. No cyanosis or clubbing noted.  Neurologic Normal speech. Oriented to person, place, and time. Protective sensation intact 5/5 intact bilaterally with 10g monofilament b/l.  Dermatologic Skin warm and supple b/l lower extremities. No open wounds b/l lower extremities. No interdigital macerations b/l lower extremities. Toenails 1-5 b/l elongated, discolored, dystrophic, thickened, crumbly with subungual debris and tenderness to dorsal palpation. Porokeratotic lesion(s) R 3rd toe and submet head 2 right foot. No erythema, no edema, no drainage, no fluctuance. Left 3rd toe with  evidence of white chalky residue on dorsal aspect of the toenail consistent with superficial white onychomycosis.  Orthopedic: Normal muscle strength 5/5 to all lower extremity muscle groups bilaterally. Hammertoe(s) noted to the 2-5 bilaterally.   Radiographs: None Assessment:   1. Pain due to onychomycosis of toenails of both feet   2. Porokeratosis   3. Right foot pain    Plan:  -Examined patient. -Medicare ABN signed for this year. Patient consents for services of paring of porokeratoses right 3rd toe and plantar aspect of right foot  today. Copy has been placed in patient's chart. -Patient to continue soft, supportive shoe gear daily. -Discussed treatment options for onychomycosis. Patient opted for topical topical therapy. Patient is to apply Formula 7 Emulsion to affected toenail(s) once daily. -Toenails 1-5 b/l were debrided in length and girth with sterile nail nippers and dremel without iatrogenic bleeding.  -Painful porokeratotic lesion(s) R 3rd toe and submet head 2 right foot pared and enucleated with sterile scalpel blade without incident. Total number of lesions debrided=2. -Patient to report any pedal injuries to medical professional immediately. -Patient/POA to call should there be question/concern in the interim.  Return in about 3 months (around 09/28/2021).  Marzetta Board, DPM

## 2021-07-03 LAB — GI PROFILE, STOOL, PCR

## 2021-07-04 MED ORDER — AZITHROMYCIN 500 MG PO TABS: 500.0000 mg | ORAL_TABLET | Freq: Every day | ORAL | 0 refills | Status: DC

## 2021-07-04 NOTE — Telephone Encounter (Signed)
What medication are you wanted to give her all you said was '500mg'$ 

## 2021-07-05 DIAGNOSIS — M65332 Trigger finger, left middle finger: Secondary | ICD-10-CM | POA: Diagnosis not present

## 2021-07-05 DIAGNOSIS — M25532 Pain in left wrist: Secondary | ICD-10-CM | POA: Diagnosis not present

## 2021-07-05 DIAGNOSIS — M13831 Other specified arthritis, right wrist: Secondary | ICD-10-CM | POA: Diagnosis not present

## 2021-07-05 DIAGNOSIS — M79642 Pain in left hand: Secondary | ICD-10-CM | POA: Diagnosis not present

## 2021-07-06 DIAGNOSIS — M65332 Trigger finger, left middle finger: Secondary | ICD-10-CM | POA: Insufficient documentation

## 2021-07-11 ENCOUNTER — Encounter: Payer: Self-pay | Admitting: Gastroenterology

## 2021-07-11 DIAGNOSIS — K529 Noninfective gastroenteritis and colitis, unspecified: Secondary | ICD-10-CM

## 2021-07-12 ENCOUNTER — Ambulatory Visit (INDEPENDENT_AMBULATORY_CARE_PROVIDER_SITE_OTHER): Payer: Medicare Other | Admitting: Vascular Surgery

## 2021-07-12 ENCOUNTER — Telehealth: Payer: Self-pay | Admitting: Gastroenterology

## 2021-07-12 NOTE — Telephone Encounter (Signed)
Patient received a message on MyChart from Dr. Marius Ditch and she is not understanding the message and to please call her about labs.

## 2021-07-12 NOTE — Telephone Encounter (Signed)
Informed patient of the instructions and she said she will come to our office to pick up stool kit

## 2021-07-13 DIAGNOSIS — K529 Noninfective gastroenteritis and colitis, unspecified: Secondary | ICD-10-CM | POA: Diagnosis not present

## 2021-07-18 ENCOUNTER — Telehealth: Payer: Self-pay

## 2021-07-18 LAB — GI PROFILE, STOOL, PCR

## 2021-07-18 LAB — CALPROTECTIN, FECAL: Calprotectin, Fecal: 62 ug/g (ref 0–120)

## 2021-07-18 MED ORDER — CIPROFLOXACIN HCL 500 MG PO TABS: 500.0000 mg | ORAL_TABLET | Freq: Two times a day (BID) | ORAL | 0 refills | Status: AC

## 2021-07-18 NOTE — Telephone Encounter (Signed)
-----   Message from [name], MD sent at 07/18/2021  9:02 AM EDT ----- Stool studies came back positive for E Coli, recommend cipro '500mg'$  BID for 5 days  RV

## 2021-07-18 NOTE — Telephone Encounter (Signed)
Called and patient verbalized understanding of results. Sent medication to Pharmacy.

## 2021-07-19 ENCOUNTER — Ambulatory Visit (INDEPENDENT_AMBULATORY_CARE_PROVIDER_SITE_OTHER): Payer: Medicare Other | Admitting: Vascular Surgery

## 2021-07-19 ENCOUNTER — Other Ambulatory Visit: Payer: Self-pay

## 2021-07-19 ENCOUNTER — Encounter (INDEPENDENT_AMBULATORY_CARE_PROVIDER_SITE_OTHER): Payer: Self-pay | Admitting: Vascular Surgery

## 2021-07-19 VITALS — BP 136/82 | HR 92 | Ht 61.0 in | Wt 171.0 lb

## 2021-07-19 DIAGNOSIS — Z20822 Contact with and (suspected) exposure to covid-19: Secondary | ICD-10-CM | POA: Diagnosis not present

## 2021-07-19 DIAGNOSIS — I872 Venous insufficiency (chronic) (peripheral): Secondary | ICD-10-CM

## 2021-07-19 DIAGNOSIS — I83812 Varicose veins of left lower extremities with pain: Secondary | ICD-10-CM

## 2021-07-19 DIAGNOSIS — I83819 Varicose veins of unspecified lower extremities with pain: Secondary | ICD-10-CM

## 2021-07-19 NOTE — Progress Notes (Signed)
Varicose veins of left lower extremity with inflammation (454.1  I83.10) Current Plans   Indication: Patient presents with symptomatic varicose veins of the left lower extremity.   Procedure: Sclerotherapy using hypertonic saline mixed with 1% Lidocaine was performed on the left lower extremity. Compression wraps were placed. The patient tolerated the procedure well. 

## 2021-07-25 ENCOUNTER — Ambulatory Visit (INDEPENDENT_AMBULATORY_CARE_PROVIDER_SITE_OTHER): Payer: Medicare Other | Admitting: *Deleted

## 2021-07-25 DIAGNOSIS — Z23 Encounter for immunization: Secondary | ICD-10-CM

## 2021-07-25 NOTE — Progress Notes (Signed)
Per orders of Dr. Diona Browner, injection of HD Influenza given by Lauralyn Primes. Patient tolerated injection well.

## 2021-07-25 NOTE — Telephone Encounter (Signed)
FYI

## 2021-07-26 DIAGNOSIS — M5412 Radiculopathy, cervical region: Secondary | ICD-10-CM | POA: Diagnosis not present

## 2021-07-26 DIAGNOSIS — M5136 Other intervertebral disc degeneration, lumbar region: Secondary | ICD-10-CM | POA: Diagnosis not present

## 2021-07-26 DIAGNOSIS — M961 Postlaminectomy syndrome, not elsewhere classified: Secondary | ICD-10-CM | POA: Diagnosis not present

## 2021-07-26 DIAGNOSIS — M545 Low back pain, unspecified: Secondary | ICD-10-CM | POA: Diagnosis not present

## 2021-07-31 DIAGNOSIS — Z20822 Contact with and (suspected) exposure to covid-19: Secondary | ICD-10-CM | POA: Diagnosis not present

## 2021-08-03 DIAGNOSIS — M65332 Trigger finger, left middle finger: Secondary | ICD-10-CM | POA: Diagnosis not present

## 2021-08-03 DIAGNOSIS — M25532 Pain in left wrist: Secondary | ICD-10-CM | POA: Diagnosis not present

## 2021-08-03 DIAGNOSIS — M13831 Other specified arthritis, right wrist: Secondary | ICD-10-CM | POA: Diagnosis not present

## 2021-08-03 DIAGNOSIS — M79642 Pain in left hand: Secondary | ICD-10-CM | POA: Diagnosis not present

## 2021-09-02 DIAGNOSIS — Z20822 Contact with and (suspected) exposure to covid-19: Secondary | ICD-10-CM | POA: Diagnosis not present

## 2021-09-26 ENCOUNTER — Emergency Department: Payer: Medicare Other

## 2021-09-26 ENCOUNTER — Other Ambulatory Visit: Payer: Self-pay | Admitting: Family Medicine

## 2021-09-26 ENCOUNTER — Other Ambulatory Visit: Payer: Self-pay

## 2021-09-26 ENCOUNTER — Emergency Department
Admission: EM | Admit: 2021-09-26 | Discharge: 2021-09-26 | Disposition: A | Discharge status: Home / Self Care | Payer: Medicare Other | Attending: Emergency Medicine | Admitting: Emergency Medicine

## 2021-09-26 DIAGNOSIS — M25552 Pain in left hip: Secondary | ICD-10-CM | POA: Diagnosis not present

## 2021-09-26 DIAGNOSIS — Z96653 Presence of artificial knee joint, bilateral: Secondary | ICD-10-CM | POA: Diagnosis not present

## 2021-09-26 DIAGNOSIS — I10 Essential (primary) hypertension: Secondary | ICD-10-CM | POA: Insufficient documentation

## 2021-09-26 DIAGNOSIS — M5441 Lumbago with sciatica, right side: Secondary | ICD-10-CM | POA: Insufficient documentation

## 2021-09-26 DIAGNOSIS — M545 Low back pain, unspecified: Secondary | ICD-10-CM | POA: Diagnosis present

## 2021-09-26 DIAGNOSIS — Z87891 Personal history of nicotine dependence: Secondary | ICD-10-CM | POA: Insufficient documentation

## 2021-09-26 DIAGNOSIS — Z79899 Other long term (current) drug therapy: Secondary | ICD-10-CM | POA: Insufficient documentation

## 2021-09-26 DIAGNOSIS — M5431 Sciatica, right side: Secondary | ICD-10-CM

## 2021-09-26 DIAGNOSIS — J4520 Mild intermittent asthma, uncomplicated: Secondary | ICD-10-CM | POA: Insufficient documentation

## 2021-09-26 MED ORDER — HYDROCODONE-ACETAMINOPHEN 5-325 MG PO TABS
1.0000 | ORAL_TABLET | Freq: Once | ORAL | Status: AC
  Administered 2021-09-26: 1 via ORAL
  Filled 2021-09-26: qty 1

## 2021-09-26 MED ORDER — CYCLOBENZAPRINE HCL 5 MG PO TABS: 5.0000 mg | ORAL_TABLET | Freq: Three times a day (TID) | ORAL | 0 refills | Status: AC | PRN

## 2021-09-26 MED ORDER — PREDNISONE 10 MG (21) PO TBPK: ORAL_TABLET | ORAL | 0 refills | Status: DC

## 2021-09-26 MED ORDER — ONDANSETRON 4 MG PO TBDP
4.0000 mg | ORAL_TABLET | Freq: Once | ORAL | Status: AC
  Administered 2021-09-26: 4 mg via ORAL
  Filled 2021-09-26: qty 1

## 2021-09-26 NOTE — ED Triage Notes (Signed)
Pt via POV from home. Pt c/o L hip pain that radiates down his L leg pain that started this AM. Denies any recent falls. Pt is A&Ox4 and NAD,

## 2021-09-26 NOTE — ED Provider Notes (Signed)
[facility]-EMERGENCY DEPARTMENT  ____________________________________________  Time seen: Approximately 10:41 PM  I have reviewed the triage vital signs and the nursing notes.   HISTORY  Chief Complaint Hip Pain   Historian Patient     HPI Tracy Dixon is a 80 y.o. female with a history of low back pain, presents to the emergency department with some left-sided low back pain that radiates down the posterior aspect of the left lower extremity.  Patient denies falls or mechanisms of trauma.  She states that she has had a urinary tract infection in the past in her life and states that her current symptoms do not feel similar.  She has no dysuria, hematuria or increased urinary frequency.  No bowel or bladder incontinence or saddle anesthesia.  She reports that she took ibuprofen at home with little relief.  No other alleviating measures have been attempted.   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[facility])    followed by [facility], dx in [place]   Allergy    Arthritis    Asthma    Complication of anesthesia    stays sleepy   Diverticulosis of colon    Gallstones    Hyperlipidemia    Hypertension    Osteoarthritis    Symptomatic mammary hypertrophy     Patient Active Problem List   Diagnosis Date Noted   Acquired trigger finger of left middle finger 07/06/2021   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   Change in consistency of stool 03/01/2021   Gastroesophageal reflux disease 12/13/2020   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   Pain and swelling of left lower leg 03/24/2020    Vitamin D deficiency 01/21/2020   Prediabetes 01/21/2020   Fat necrosis of breast 04/17/2019   History of bilateral breast reduction surgery 08/27/2018   Chronic venous insufficiency 05/12/2018   Lymphedema 05/12/2018   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[facility]) 33/82/5053   Metabolic syndrome 97/67/3419   Counseling regarding end of life decision making 09/30/2014   Spinal stenosis of lumbar region 03/17/2013   Osteopenia 05/23/2009   Sciatica of left side associated with disorder of lumbar spine 09/25/2007   High cholesterol 09/22/2007   OBESITY 09/22/2007   Essential hypertension, benign 09/22/2007   Varicose veins with pain 09/22/2007   ALLERGIC RHINITIS 09/22/2007   Mild intermittent asthma 09/22/2007   DIVERTICULOSIS, COLON 09/22/2007   DJD (degenerative joint disease) 09/22/2007    Past Surgical History:  Procedure Laterality Date   Bilateral tubal ligation  2005   BREAST REDUCTION SURGERY Bilateral 06/04/2018   Procedure: MAMMARY REDUCTION  (BREAST);  Surgeon: Wallace Going, DO;  Location: [place];  Service: Plastics;  Laterality: Bilateral;   Carotid doppler  07/2006   Neg   CESAREAN SECTION  2005   CHOLECYSTECTOMY  1982   DECOMPRESSIVE LUMBAR LAMINECTOMY LEVEL 3  06/10/2013   Procedure: DECOMPRESSIVE LUMBAR LAMINECTOMY LEVEL 3;  Surgeon: Melina Schools, MD;  Location: [place];  Service: Orthopedics;;  lumbar three  to five decompression   MRI brain  2007   acoustic neuroma (check yearly with MRI)   PFT's  12/2006   (-) FEV1/FVC 79%, FEV1 84%   REDUCTION MAMMAPLASTY Bilateral 05/2018   REPLACEMENT TOTAL KNEE BILATERAL  2005   TONSILLECTOMY  2005   TUBAL LIGATION      Prior to Admission medications   Medication Sig Start Date End Date Taking? Authorizing Provider  cyclobenzaprine (FLEXERIL) 5 MG tablet Take 1 tablet (5 mg total) by mouth 3 (three) times daily as needed for up to 5 days for muscle spasms. 09/26/21 10/01/21 Yes Vallarie Mare M, PA-C  predniSONE  (STERAPRED UNI-PAK 21 TAB) 10 MG (21) TBPK tablet 6,5,4,3,2,1 09/26/21  Yes Sherral Hammers, Azariah Bonura M, PA-C  albuterol (VENTOLIN HFA) 108 (90 Base) MCG/ACT inhaler INHALE 1 TO 2 PUFFS BY MOUTH EVERY 6 HOURS AS NEEDED FOR SHORTNESS OF BREATH (FOR  USE  WHEN  IN  CONTACT  WITH  DOGS  OR  CATS) 05/22/21   Bedsole, Amy E, MD  amLODipine (NORVASC) 5 MG tablet TAKE 1 TABLET BY MOUTH  DAILY 04/30/21   Bedsole, Amy E, MD  atorvastatin (LIPITOR) 20 MG tablet TAKE 1 TABLET BY MOUTH  DAILY 01/06/21   Bedsole, Amy E, MD  azithromycin (ZITHROMAX) 500 MG tablet Take 1 tablet (500 mg total) by mouth daily. 07/04/21   [name], MD  cetirizine (ZYRTEC) 10 MG tablet Take 10 mg by mouth daily as needed for allergies. Reported on 01/10/2016    [provider]  cholecalciferol (VITAMIN D) 1000 UNITS tablet Take 1,000 Units by mouth daily.    [provider]  hydrochlorothiazide (MICROZIDE) 12.5 MG capsule TAKE 1 CAPSULE BY MOUTH  DAILY 02/07/21   Bedsole, Amy E, MD  omeprazole (PRILOSEC OTC) 20 MG tablet Take 20 mg by mouth daily.    [provider]    Allergies Contrast media [iodinated diagnostic agents] and Otho Darner allergy]  Family History  Problem Relation Age of Onset   Cancer Mother        Lung CA   Stroke Mother        CVA   Cancer Father        Prostate   Heart disease Neg Hx    Breast cancer Neg Hx     Social History Social History   Tobacco Use   Smoking status: Former    Years: 15.00    Types: Cigarettes    Quit date: 06/02/1973    Years since quitting: 48.3   Smokeless tobacco: Never   Tobacco comments:    few cigs/day  Vaping Use   Vaping Use: Never used  Substance Use Topics   Alcohol use: No   Drug use: No     Review of Systems  Constitutional: No fever/chills Eyes:  No discharge ENT: No upper respiratory complaints. Respiratory: no cough. No SOB/ use of accessory muscles to breath Gastrointestinal:   No nausea, no vomiting.  No diarrhea.  No  constipation. Musculoskeletal: Patient has left leg pain.  Skin: Negative for rash, abrasions, lacerations, ecchymosis.    ____________________________________________   PHYSICAL EXAM:  VITAL SIGNS: ED Triage Vitals  Enc Vitals Group     BP 09/26/21 1801 (!) 155/78     Pulse Rate 09/26/21 1801 92     Resp 09/26/21 1801 20     Temp 09/26/21 1801 98.2 F (36.8 C)     Temp Source 09/26/21 1801 Oral     SpO2 09/26/21 1801  96 %     Weight 09/26/21 1758 168 lb (76.2 kg)     Height 09/26/21 1758 5\' 1"  (1.549 m)     Head Circumference --      Peak Flow --      Pain Score 09/26/21 1758 8     Pain Loc --      Pain Edu? --      Excl. in [place]? --      Constitutional: Alert and oriented. Well appearing and in no acute distress. Eyes: Conjunctivae are normal. PERRL. EOMI. Head: Atraumatic. ENT: Cardiovascular: Normal rate, regular rhythm. Normal S1 and S2.  Good peripheral circulation. Respiratory: Normal respiratory effort without tachypnea or retractions. Lungs CTAB. Good air entry to the bases with no decreased or absent breath sounds Gastrointestinal: Bowel sounds x 4 quadrants. Soft and nontender to palpation. No guarding or rigidity. No distention. Musculoskeletal: Full range of motion to all extremities. No obvious deformities noted. Patient has positive straight leg raise test on the left.  Neurologic:  Normal for age. No gross focal neurologic deficits are appreciated.  Skin:  Skin is warm, dry and intact. No rash noted. Psychiatric: Mood and affect are normal for age. Speech and behavior are normal.   ____________________________________________   LABS (all labs ordered are listed, but only abnormal results are displayed)  Labs Reviewed - No data to display ____________________________________________  EKG   ____________________________________________  RADIOLOGY Unk Pinto, personally viewed and evaluated these images (plain radiographs) as part of my  medical decision making, as well as reviewing the written report by the radiologist.  DG Hip Unilat With Pelvis 2-3 Views Left  Result Date: 09/26/2021 CLINICAL DATA:  Left hip pain radiating down the left leg. Symptoms started this morning. EXAM: DG HIP (WITH OR WITHOUT PELVIS) 2-3V LEFT COMPARISON:  None. FINDINGS: No fracture or bone lesion. Hip joints are normally spaced and aligned. No significant degenerative/arthropathic change. SI joints and pubic symphysis are also normally spaced and aligned. Soft tissues are unremarkable. Previous L4-L5 posterior lumbar spine fusion. IMPRESSION: No fracture, bone lesion or significant hip joint abnormality. Electronically Signed   By: Lajean Manes M.D.   On: 09/26/2021 18:40    ____________________________________________    PROCEDURES  Procedure(s) performed:     Procedures     Medications  HYDROcodone-acetaminophen (NORCO/VICODIN) 5-325 MG per tablet 1 tablet (1 tablet Oral Given 09/26/21 2230)  ondansetron (ZOFRAN-ODT) disintegrating tablet 4 mg (4 mg Oral Given 09/26/21 2230)     ____________________________________________   INITIAL IMPRESSION / ASSESSMENT AND PLAN / ED COURSE  Pertinent labs & imaging results that were available during my care of the patient were reviewed by me and considered in my medical decision making (see chart for details).      Lumbar radiculopathy 80 year old female presents to the emergency department with left-sided low back pain radiates down the posterior aspect of the left lower extremity.  Patient was mildly hypertensive at triage but vital signs were otherwise reassuring.  She had a positive straight leg raise test.  Patient was started on tapered prednisone and Flexeril as she reports that she has tolerated muscle relaxers in the past without complications.  She was advised to return to the emergency department or follow-up with neurosurgery if her symptoms seem like they are worsening or not  improving.     ____________________________________________  FINAL CLINICAL IMPRESSION(S) / ED DIAGNOSES  Final diagnoses:  Sciatica of right side      NEW MEDICATIONS STARTED DURING  THIS VISIT:  ED Discharge Orders          Ordered    cyclobenzaprine (FLEXERIL) 5 MG tablet  3 times daily PRN        09/26/21 2228    predniSONE (STERAPRED UNI-PAK 21 TAB) 10 MG (21) TBPK tablet        09/26/21 2228                This chart was dictated using voice recognition software/Dragon. Despite best efforts to proofread, errors can occur which can change the meaning. Any change was purely unintentional.     Lannie Fields, PA-C 09/26/21 2245    Duffy Bruce, MD 09/27/21 1324

## 2021-09-26 NOTE — Discharge Instructions (Signed)
Take tapered steroid as directed. You can take Flexeril up to 3 times daily for the next 5 days.

## 2021-09-26 NOTE — ED Notes (Signed)
Patient given discharge instructions, all questions answered. Patient in possession of all belongings, directed to the discharge area  

## 2021-09-28 DIAGNOSIS — Z20822 Contact with and (suspected) exposure to covid-19: Secondary | ICD-10-CM | POA: Diagnosis not present

## 2021-09-29 ENCOUNTER — Ambulatory Visit (INDEPENDENT_AMBULATORY_CARE_PROVIDER_SITE_OTHER): Payer: Medicare Other | Admitting: Podiatry

## 2021-09-29 ENCOUNTER — Encounter: Payer: Self-pay | Admitting: Podiatry

## 2021-09-29 ENCOUNTER — Other Ambulatory Visit: Payer: Self-pay

## 2021-09-29 DIAGNOSIS — Q828 Other specified congenital malformations of skin: Secondary | ICD-10-CM | POA: Diagnosis not present

## 2021-09-29 DIAGNOSIS — M79671 Pain in right foot: Secondary | ICD-10-CM

## 2021-09-29 DIAGNOSIS — B351 Tinea unguium: Secondary | ICD-10-CM

## 2021-09-29 DIAGNOSIS — M79674 Pain in right toe(s): Secondary | ICD-10-CM | POA: Diagnosis not present

## 2021-09-29 DIAGNOSIS — M79675 Pain in left toe(s): Secondary | ICD-10-CM | POA: Diagnosis not present

## 2021-10-02 NOTE — Progress Notes (Signed)
Subjective: Tracy Dixon is a pleasant 80 y.o. female patient seen today for painful porokeratotic lesion(s) of both feet and painful mycotic toenails that limit ambulation. Painful toenails interfere with ambulation. Aggravating factors include wearing enclosed shoe gear. Pain is relieved with periodic professional debridement. Painful porokeratotic lesions are aggravated when weightbearing with and without shoegear. Pain is relieved with periodic professional debridement.   Patient notes no new pedal problems on today's visit.  PCP is [name], MD. Last visit was: 04/06  Allergies  Allergen Reactions   Contrast Media [Iodinated Diagnostic Agents] Hives   Crab [Shellfish Allergy] Hives    Objective: Physical Exam  General: Tracy Dixon is a pleasant 80 y.o. African American female,  WD, WN in NAD. AAO x 3.   Vascular:  Capillary refill time to digits immediate b/l. Palpable DP pulse(s) b/l lower extremities Palpable PT pulse(s) b/l lower extremities No pain with calf compression b/l. Lower extremity skin temperature gradient within normal limits. Lymphedema present left lower extremity.  Dermatological:  Pedal integument with normal turgor, texture and tone b/l LE. No open wounds b/l. No interdigital macerations b/l. Toenails 1-5 b/l elongated, thickened, discolored with subungual debris. +Tenderness with dorsal palpation of nailplates. Porokeratotic lesion(s) noted R 3rd toe and submet head 2 right foot. Toenails 1-5 b/l elongated, discolored, dystrophic, thickened, crumbly with subungual debris and tenderness to dorsal palpation.   Musculoskeletal:  Normal muscle strength 5/5 to all lower extremity muscle groups bilaterally. Hammertoe deformity noted 2-5 b/l.   Neurological:  Protective sensation intact 5/5 intact bilaterally with 10g monofilament b/l.   Assessment and Plan:  1. Pain due to onychomycosis of toenails of both feet   2. Porokeratosis   3. Right foot pain      Patient was evaluated and treated and all questions answered. Consent given for treatment as described below: -Examined patient. -Medicare ABN on file for paring of porokeratoses.. -Toenails 1-5 b/l were debrided in length and girth with sterile nail nippers and dremel without iatrogenic bleeding.  -Painful porokeratotic lesion(s) R 3rd toe and submet head 2 right foot pared and enucleated with sterile scalpel blade without incident. Total number of lesions debrided=2. -Patient/POA to call should there be question/concern in the interim.  Return in about 3 months (around 12/30/2021).  Marzetta Board, DPM

## 2021-10-05 DIAGNOSIS — H15102 Unspecified episcleritis, left eye: Secondary | ICD-10-CM | POA: Diagnosis not present

## 2021-10-17 DIAGNOSIS — M5416 Radiculopathy, lumbar region: Secondary | ICD-10-CM | POA: Diagnosis not present

## 2021-10-25 DIAGNOSIS — H15102 Unspecified episcleritis, left eye: Secondary | ICD-10-CM | POA: Diagnosis not present

## 2021-10-26 DIAGNOSIS — Z20822 Contact with and (suspected) exposure to covid-19: Secondary | ICD-10-CM | POA: Diagnosis not present

## 2021-11-14 ENCOUNTER — Encounter: Payer: Self-pay | Admitting: Family Medicine

## 2021-11-28 ENCOUNTER — Telehealth: Payer: Self-pay | Admitting: Family Medicine

## 2021-11-28 DIAGNOSIS — R7303 Prediabetes: Secondary | ICD-10-CM

## 2021-11-28 DIAGNOSIS — E559 Vitamin D deficiency, unspecified: Secondary | ICD-10-CM

## 2021-11-28 DIAGNOSIS — E7800 Pure hypercholesterolemia, unspecified: Secondary | ICD-10-CM

## 2021-11-28 NOTE — Telephone Encounter (Signed)
-----   Message from Ellamae Sia sent at 11/23/2021  2:53 PM EST ----- Regarding: Lab orders for Thursday, 1.12.23 Patient is scheduled for CPX labs, please order future labs, Thanks , Karna Christmas

## 2021-12-07 ENCOUNTER — Other Ambulatory Visit: Payer: Medicare Other

## 2021-12-09 ENCOUNTER — Other Ambulatory Visit: Payer: Self-pay | Admitting: Family Medicine

## 2021-12-14 ENCOUNTER — Encounter: Payer: Medicare Other | Admitting: Family Medicine

## 2021-12-15 DIAGNOSIS — Z20822 Contact with and (suspected) exposure to covid-19: Secondary | ICD-10-CM | POA: Diagnosis not present

## 2021-12-20 ENCOUNTER — Other Ambulatory Visit: Payer: Self-pay | Admitting: Family Medicine

## 2022-01-03 DIAGNOSIS — Z20822 Contact with and (suspected) exposure to covid-19: Secondary | ICD-10-CM | POA: Diagnosis not present

## 2022-01-09 ENCOUNTER — Other Ambulatory Visit (INDEPENDENT_AMBULATORY_CARE_PROVIDER_SITE_OTHER): Payer: Medicare Other

## 2022-01-09 ENCOUNTER — Encounter: Payer: Self-pay | Admitting: Podiatry

## 2022-01-09 ENCOUNTER — Ambulatory Visit (INDEPENDENT_AMBULATORY_CARE_PROVIDER_SITE_OTHER): Payer: Medicare Other | Admitting: Podiatry

## 2022-01-09 ENCOUNTER — Other Ambulatory Visit: Payer: Self-pay

## 2022-01-09 ENCOUNTER — Other Ambulatory Visit: Payer: Medicare Other

## 2022-01-09 DIAGNOSIS — R7303 Prediabetes: Secondary | ICD-10-CM

## 2022-01-09 DIAGNOSIS — M79674 Pain in right toe(s): Secondary | ICD-10-CM | POA: Diagnosis not present

## 2022-01-09 DIAGNOSIS — N3001 Acute cystitis with hematuria: Secondary | ICD-10-CM | POA: Diagnosis not present

## 2022-01-09 DIAGNOSIS — B351 Tinea unguium: Secondary | ICD-10-CM

## 2022-01-09 DIAGNOSIS — E7800 Pure hypercholesterolemia, unspecified: Secondary | ICD-10-CM

## 2022-01-09 DIAGNOSIS — R300 Dysuria: Secondary | ICD-10-CM | POA: Diagnosis not present

## 2022-01-09 DIAGNOSIS — M79671 Pain in right foot: Secondary | ICD-10-CM | POA: Diagnosis not present

## 2022-01-09 DIAGNOSIS — E559 Vitamin D deficiency, unspecified: Secondary | ICD-10-CM

## 2022-01-09 DIAGNOSIS — M79675 Pain in left toe(s): Secondary | ICD-10-CM | POA: Diagnosis not present

## 2022-01-09 DIAGNOSIS — Q828 Other specified congenital malformations of skin: Secondary | ICD-10-CM | POA: Diagnosis not present

## 2022-01-09 LAB — HEMOGLOBIN A1C: Hgb A1c MFr Bld: 6 % (ref 4.6–6.5)

## 2022-01-09 LAB — VITAMIN D 25 HYDROXY (VIT D DEFICIENCY, FRACTURES): VITD: 84.11 ng/mL (ref 30.00–100.00)

## 2022-01-10 LAB — COMPREHENSIVE METABOLIC PANEL
ALT: 9 U/L (ref 0–35)
AST: 14 U/L (ref 0–37)
Albumin: 3.9 g/dL (ref 3.5–5.2)
Alkaline Phosphatase: 65 U/L (ref 39–117)
BUN: 22 mg/dL (ref 6–23)
CO2: 27 mEq/L (ref 19–32)
Calcium: 9.2 mg/dL (ref 8.4–10.5)
Chloride: 101 mEq/L (ref 96–112)
Creatinine, Ser: 0.91 mg/dL (ref 0.40–1.20)
GFR: 59.44 mL/min — ABNORMAL LOW (ref >60.00)
Glucose, Bld: 87 mg/dL (ref 70–99)
Potassium: 3.7 mEq/L (ref 3.5–5.1)
Sodium: 138 mEq/L (ref 135–145)
Total Bilirubin: 0.3 mg/dL (ref 0.2–1.2)
Total Protein: 7.4 g/dL (ref 6.0–8.3)

## 2022-01-10 LAB — LIPID PANEL
Cholesterol: 129 mg/dL (ref 0–200)
HDL: 44.5 mg/dL (ref >39.00)
LDL Cholesterol: 72 mg/dL (ref 0–99)
NonHDL: 84.32
Total CHOL/HDL Ratio: 3
Triglycerides: 64 mg/dL (ref 0.0–149.0)
VLDL: 12.8 mg/dL (ref 0.0–40.0)

## 2022-01-11 ENCOUNTER — Other Ambulatory Visit: Payer: Medicare Other

## 2022-01-11 NOTE — Progress Notes (Signed)
No critical labs need to be addressed urgently. We will discuss labs in detail at upcoming office visit.   

## 2022-01-14 NOTE — Progress Notes (Signed)
°  Subjective:  Patient ID: Tracy Dixon, female    DOB: Mar 24, 1941,  MRN: [id number]  Tracy Dixon presents to clinic today for painful porokeratotic lesion(s) bilaterally and painful mycotic toenails that limit ambulation. Painful toenails interfere with ambulation. Aggravating factors include wearing enclosed shoe gear. Pain is relieved with periodic professional debridement. Painful porokeratotic lesions are aggravated when weightbearing with and without shoegear. Pain is relieved with periodic professional debridement.  New problem(s): None.   Patient states she suddenly lost her husband in December.  PCP is [name], MD , and last visit was December 29, 2020.  Allergies  Allergen Reactions   Contrast Media [Iodinated Contrast Media] Hives   Crab [Shellfish Allergy] Hives    Review of Systems: Negative except as noted in the HPI. Objective:   Constitutional Tracy Dixon is a pleasant 81 y.o. African American female, WD, WN in NAD. AAO x 3.   Vascular Capillary refill time to digits immediate b/l. Palpable pedal pulses b/l LE. Pedal hair sparse. No pain with calf compression b/l. Lower extremity skin temperature gradient within normal limits. No edema noted b/l LE. Lymphedema present left lower extremity.  Neurologic Normal speech. Oriented to person, place, and time. Protective sensation intact 5/5 intact bilaterally with 10g monofilament b/l. Vibratory sensation intact b/l.  Dermatologic Pedal integument with normal turgor, texture and tone BLE. No open wounds b/l LE. No interdigital macerations noted b/l LE. Toenails 1-5 b/l elongated, discolored, dystrophic, thickened, crumbly with subungual debris and tenderness to dorsal palpation. Porokeratotic lesion(s) R 3rd toe and submet head 2 right foot. No erythema, no edema, no drainage, no fluctuance.  Orthopedic: Normal muscle strength 5/5 to all lower extremity muscle groups bilaterally. Hammertoe deformity noted 2-5 b/l.Marland Kitchen No  pain, crepitus or joint limitation noted with ROM b/l LE.  Patient ambulates independently without assistive aids.   Radiographs: None  Last A1c:  Hemoglobin A1C Latest Ref Rng & Units 01/09/2022  HGBA1C 4.6 - 6.5 % 6.0  Some recent data might be hidden   Assessment:   1. Pain due to onychomycosis of toenails of both feet   2. Porokeratosis   3. Right foot pain    Plan:  Patient was evaluated and treated and all questions answered. Consent given for treatment as described below: -Medicare ABN on file for consent  for paring of porokeratoses. -Mycotic toenails 1-5 bilaterally were debrided in length and girth with sterile nail nippers and dremel without incident. -Painful porokeratotic lesion(s) R 3rd toe and submet head 2 right foot pared and enucleated with sterile scalpel blade without incident. Total number of lesions debrided=2. -Patient/POA to call should there be question/concern in the interim.  Return in about 9 weeks (around 03/13/2022).  Marzetta Board, DPM

## 2022-01-19 ENCOUNTER — Encounter: Payer: Self-pay | Admitting: Family Medicine

## 2022-01-19 ENCOUNTER — Other Ambulatory Visit: Payer: Self-pay

## 2022-01-19 ENCOUNTER — Ambulatory Visit (INDEPENDENT_AMBULATORY_CARE_PROVIDER_SITE_OTHER): Payer: Medicare Other | Admitting: Family Medicine

## 2022-01-19 VITALS — BP 130/70 | HR 100 | Temp 98.7°F | Ht 60.0 in | Wt 167.1 lb

## 2022-01-19 DIAGNOSIS — I10 Essential (primary) hypertension: Secondary | ICD-10-CM | POA: Diagnosis not present

## 2022-01-19 DIAGNOSIS — Z0000 Encounter for general adult medical examination without abnormal findings: Secondary | ICD-10-CM

## 2022-01-19 NOTE — Patient Instructions (Addendum)
Increase water as able. Work on low [employer].  Get back to biking at the [facility]. Please call the location of your choice from the menu below to schedule your Mammogram and/or Bone Density appointment.    [place] at [facility]   Phone:  [phone]   [place], [place] [address]                                            Services: 3D Mammogram and [place]  [place] at [facility])  Phone:  [phone]   [address] Room [place], [place] [address]                                              Services:  3D Mammogram and Bone Density

## 2022-01-19 NOTE — Assessment & Plan Note (Signed)
Stable, chronic.  Continue current medication.   Amlodipine 5 mg daily  HCTZ 25 mg daily 

## 2022-01-19 NOTE — Progress Notes (Signed)
Patient ID: Tracy Dixon, female    DOB: Mar 13, 1941, 81 y.o.   MRN: [id number]  This visit was conducted in person.  Pulse 100    Temp 98.7 F (37.1 C) (Temporal)    Ht 5' (1.524 m)    Wt 167 lb 1 oz (75.8 kg)    SpO2 95%    BMI 32.63 kg/m    CC: Chief Complaint  Patient presents with   Medicare Wellness    Subjective:   HPI: Tracy Dixon is a 81 y.o. female presenting on 01/19/2022 for Medicare Wellness  The patient presents for annual medicare wellness, complete physical and review of chronic health problems. He/She also has the following acute concerns today:  I have personally reviewed the Medicare Annual Wellness questionnaire and have noted 1. The patient's medical and social history 2. Their use of alcohol, tobacco or illicit drugs 3. Their current medications and supplements 4. The patient's functional ability including ADL's, fall risks, home safety risks and hearing or visual             impairment. 5. Diet and physical activities 6. Evidence for depression or mood disorders 7.         Updated provider list Cognitive evaluation was performed and recorded on pt medicare questionnaire form. The patients weight, height, BMI and visual acuity have been recorded in the chart   I have made referrals, counseling and provided education to the patient based review of the above and I have provided the pt with a written personalized care plan for preventive services.  Documentation of this information was scanned into the electronic record under the media tab.   Advance directives and end of life planning reviewed in detail with patient and documented in EMR. Patient given handout on advance care directives if needed. HCPOA and living will updated if needed.  Hearing Screening  Method: Audiometry   500Hz  1000Hz  2000Hz  4000Hz   Right ear 20 20 20 25   Left ear 0 0 0 0  Vision Screening - Comments:: Eye Exam with Dr. Linton Flemings at [facility] 10/2021    No falls  in last year.  He husband passed away in the last year.  Family is supportive of her .. she speaks to kids daily. [place] Office Visit from 01/19/2022 in [place] at [facility]  PHQ-2 Total Score 1       Hypertension:   Good control on amlodipine   BP Readings from Last 3 Encounters:  01/19/22 130/70  09/26/21 (!) 155/77  07/19/21 136/82  Using medication without problems or lightheadedness:  Chest pain with exertion: none Edema:yes due to chronic venous insufficiency.. using compression hose Short of breath: none Average home BPs: Other issues:  She is trying to to get  back to the [facility]..bicycling. Wt Readings from Last 3 Encounters:  01/19/22 167 lb 1 oz (75.8 kg)  09/26/21 168 lb (76.2 kg)  07/19/21 171 lb (77.6 kg)   Body mass index is 32.63 kg/m.   Dr Marius Ditch: GI treating EColi in stool and IBS causing diarrhea.  IMproved symptoms at this point as long as she restricts diet.   She  recently complete a course of doxy for   multidrug resistant ECOLI UTI. She has had resolution of antibiotics.    Relevant past medical, surgical, family and social history reviewed and updated as indicated. Interim medical history since our last visit reviewed. Allergies and medications reviewed and updated. Outpatient Medications Prior to Visit  Medication Sig Dispense Refill   amLODipine (NORVASC) 5 MG tablet TAKE 1 TABLET BY MOUTH DAILY 90 tablet 0   atorvastatin (LIPITOR) 20 MG tablet TAKE 1 TABLET BY MOUTH  DAILY 90 tablet 0   cetirizine (ZYRTEC) 10 MG tablet Take 10 mg by mouth daily as needed for allergies. Reported on 01/10/2016     cholecalciferol (VITAMIN D) 1000 UNITS tablet Take 1,000 Units by mouth daily.     hydrochlorothiazide (MICROZIDE) 12.5 MG capsule TAKE 1 CAPSULE BY MOUTH  DAILY 90 capsule 3   omeprazole (PRILOSEC OTC) 20 MG tablet Take 20 mg by mouth daily.     albuterol (VENTOLIN HFA) 108 (90 Base) MCG/ACT inhaler INHALE 1 TO 2 PUFFS BY MOUTH EVERY 6  HOURS AS NEEDED FOR SHORTNESS OF BREATH (FOR  USE  WHEN  IN  CONTACT  WITH  DOGS  OR  CATS) 18 g 0   azithromycin (ZITHROMAX) 500 MG tablet Take 1 tablet (500 mg total) by mouth daily. 3 tablet 0   neomycin-polymyxin b-dexamethasone (MAXITROL) 3.5-10000-0.1 SUSP Place 1 drop into the left eye 4 (four) times daily.     predniSONE (STERAPRED UNI-PAK 21 TAB) 10 MG (21) TBPK tablet 6,5,4,3,2,1 21 tablet 0   No facility-administered medications prior to visit.     Per HPI unless specifically indicated in ROS section below Review of Systems  Constitutional:  Negative for fatigue and fever.  HENT:  Negative for congestion.   Eyes:  Negative for pain.  Respiratory:  Negative for cough and shortness of breath.   Cardiovascular:  Negative for chest pain, palpitations and leg swelling.  Gastrointestinal:  Negative for abdominal pain.  Genitourinary:  Negative for dysuria and vaginal bleeding.  Musculoskeletal:  Negative for back pain.  Neurological:  Negative for syncope, light-headedness and headaches.  Psychiatric/Behavioral:  Negative for dysphoric mood.   Objective:  Pulse 100    Temp 98.7 F (37.1 C) (Temporal)    Ht 5' (1.524 m)    Wt 167 lb 1 oz (75.8 kg)    SpO2 95%    BMI 32.63 kg/m   Wt Readings from Last 3 Encounters:  01/19/22 167 lb 1 oz (75.8 kg)  09/26/21 168 lb (76.2 kg)  07/19/21 171 lb (77.6 kg)      Physical Exam Constitutional:      General: She is not in acute distress.    Appearance: Normal appearance. She is well-developed. She is not ill-appearing or toxic-appearing.  HENT:     Head: Normocephalic.     Right Ear: Hearing, tympanic membrane, ear canal and external ear normal. Tympanic membrane is not erythematous, retracted or bulging.     Left Ear: Hearing, tympanic membrane, ear canal and external ear normal. Tympanic membrane is not erythematous, retracted or bulging.     Nose: No mucosal edema or rhinorrhea.     Right Sinus: No maxillary sinus tenderness or  frontal sinus tenderness.     Left Sinus: No maxillary sinus tenderness or frontal sinus tenderness.     Mouth/Throat:     Pharynx: Uvula midline.  Eyes:     General: Lids are normal. Lids are everted, no foreign bodies appreciated.     Conjunctiva/sclera: Conjunctivae normal.     Pupils: Pupils are equal, round, and reactive to light.  Neck:     Thyroid: No thyroid mass or thyromegaly.     Vascular: No carotid bruit.     Trachea: Trachea normal.  Cardiovascular:     Rate and Rhythm:  Normal rate and regular rhythm.     Pulses: Normal pulses.     Heart sounds: Normal heart sounds, S1 normal and S2 normal. No murmur heard.   No friction rub. No gallop.  Pulmonary:     Effort: Pulmonary effort is normal. No tachypnea or respiratory distress.     Breath sounds: Normal breath sounds. No decreased breath sounds, wheezing, rhonchi or rales.  Abdominal:     General: Bowel sounds are normal.     Palpations: Abdomen is soft.     Tenderness: There is no abdominal tenderness.  Musculoskeletal:     Cervical back: Normal range of motion and neck supple.  Skin:    General: Skin is warm and dry.     Findings: No rash.  Neurological:     Mental Status: She is alert.  Psychiatric:        Mood and Affect: Mood is not anxious or depressed.        Speech: Speech normal.        Behavior: Behavior normal. Behavior is cooperative.        Thought Content: Thought content normal.        Judgment: Judgment normal.      Results for orders placed or performed in visit on 01/09/22  VITAMIN D 25 Hydroxy (Vit-D Deficiency, Fractures)  Result Value Ref Range   VITD 84.11 30.00 - 100.00 ng/mL  Lipid panel  Result Value Ref Range   Cholesterol 129 0 - 200 mg/dL   Triglycerides 64.0 0.0 - 149.0 mg/dL   HDL 44.50 >39.00 mg/dL   VLDL 12.8 0.0 - 40.0 mg/dL   LDL Cholesterol 72 0 - 99 mg/dL   Total CHOL/HDL Ratio 3    NonHDL 84.32   Hemoglobin A1c  Result Value Ref Range   Hgb A1c MFr Bld 6.0 4.6 - 6.5  %  Comprehensive metabolic panel  Result Value Ref Range   Sodium 138 135 - 145 mEq/L   Potassium 3.7 3.5 - 5.1 mEq/L   Chloride 101 96 - 112 mEq/L   CO2 27 19 - 32 mEq/L   Glucose, Bld 87 70 - 99 mg/dL   BUN 22 6 - 23 mg/dL   Creatinine, Ser 0.91 0.40 - 1.20 mg/dL   Total Bilirubin 0.3 0.2 - 1.2 mg/dL   Alkaline Phosphatase 65 39 - 117 U/L   AST 14 0 - 37 U/L   ALT 9 0 - 35 U/L   Total Protein 7.4 6.0 - 8.3 g/dL   Albumin 3.9 3.5 - 5.2 g/dL   GFR 59.44 (L) >60.00 mL/min   Calcium 9.2 8.4 - 10.5 mg/dL    This visit occurred during the SARS-CoV-2 public health emergency.  Safety protocols were in place, including screening questions prior to the visit, additional usage of staff PPE, and extensive cleaning of exam room while observing appropriate contact time as indicated for disinfecting solutions.   COVID 19 screen:  No recent travel or known exposure to COVID19 The patient denies respiratory symptoms of COVID 19 at this time. The importance of social distancing was discussed today.   Assessment and Plan   The patient's preventative maintenance and recommended screening tests for an annual wellness exam were reviewed in full today. Brought up to date unless services declined.  Counselled on the importance of diet, exercise, and its role in overall health and mortality. The patient's FH and SH was reviewed, including their home life, tobacco status, and drug and alcohol status.  Vaccines: uptodate, refused tdap. Has gotten COVID19 x 3, Shingrix Pap/DVE:  No pap/no DVE indicated, No family history of uterine or ovarian cancer. No vaginal bleeding, no discharge. Mammo:   12/2020... 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   Problem List Items Addressed This Visit     Essential hypertension, benign  (Chronic)    Stable, chronic.  Continue current medication.   Amlodipine 5 mg daily  HCTZ 25 mg daily      Other Visit Diagnoses     Medicare annual wellness visit, subsequent    -  Primary        Eliezer Lofts, MD

## 2022-01-21 ENCOUNTER — Other Ambulatory Visit: Payer: Self-pay | Admitting: Family Medicine

## 2022-01-22 ENCOUNTER — Other Ambulatory Visit: Payer: Self-pay | Admitting: Family Medicine

## 2022-01-22 DIAGNOSIS — Z1231 Encounter for screening mammogram for malignant neoplasm of breast: Secondary | ICD-10-CM

## 2022-01-30 DIAGNOSIS — Z20822 Contact with and (suspected) exposure to covid-19: Secondary | ICD-10-CM | POA: Diagnosis not present

## 2022-02-20 DIAGNOSIS — Z20822 Contact with and (suspected) exposure to covid-19: Secondary | ICD-10-CM | POA: Diagnosis not present

## 2022-02-25 ENCOUNTER — Emergency Department
Admission: EM | Admit: 2022-02-25 | Discharge: 2022-02-25 | Discharge status: Absent without leave | Payer: Medicare Other | Source: Home / Self Care

## 2022-02-27 ENCOUNTER — Ambulatory Visit
Admission: RE | Admit: 2022-02-27 | Discharge: 2022-02-27 | Disposition: A | Discharge status: Home / Self Care | Payer: Medicare Other | Source: Ambulatory Visit | Attending: Family Medicine | Admitting: Family Medicine

## 2022-02-27 ENCOUNTER — Other Ambulatory Visit: Payer: Self-pay | Admitting: Family Medicine

## 2022-02-27 DIAGNOSIS — Z1231 Encounter for screening mammogram for malignant neoplasm of breast: Secondary | ICD-10-CM | POA: Diagnosis not present

## 2022-02-28 DIAGNOSIS — Z20822 Contact with and (suspected) exposure to covid-19: Secondary | ICD-10-CM | POA: Diagnosis not present

## 2022-03-13 ENCOUNTER — Other Ambulatory Visit: Payer: Self-pay | Admitting: Family Medicine

## 2022-03-13 ENCOUNTER — Ambulatory Visit (INDEPENDENT_AMBULATORY_CARE_PROVIDER_SITE_OTHER): Payer: Medicare Other | Admitting: Podiatry

## 2022-03-13 ENCOUNTER — Encounter: Payer: Self-pay | Admitting: Podiatry

## 2022-03-13 DIAGNOSIS — M79674 Pain in right toe(s): Secondary | ICD-10-CM

## 2022-03-13 DIAGNOSIS — M79675 Pain in left toe(s): Secondary | ICD-10-CM

## 2022-03-13 DIAGNOSIS — Q828 Other specified congenital malformations of skin: Secondary | ICD-10-CM | POA: Diagnosis not present

## 2022-03-13 DIAGNOSIS — M79671 Pain in right foot: Secondary | ICD-10-CM | POA: Diagnosis not present

## 2022-03-13 DIAGNOSIS — B351 Tinea unguium: Secondary | ICD-10-CM | POA: Diagnosis not present

## 2022-03-19 DIAGNOSIS — M13849 Other specified arthritis, unspecified hand: Secondary | ICD-10-CM | POA: Diagnosis not present

## 2022-03-19 DIAGNOSIS — M13831 Other specified arthritis, right wrist: Secondary | ICD-10-CM | POA: Diagnosis not present

## 2022-03-19 DIAGNOSIS — M19049 Primary osteoarthritis, unspecified hand: Secondary | ICD-10-CM | POA: Insufficient documentation

## 2022-03-19 DIAGNOSIS — M65332 Trigger finger, left middle finger: Secondary | ICD-10-CM | POA: Diagnosis not present

## 2022-03-20 NOTE — Progress Notes (Signed)
?  Subjective:  ?Patient ID: Tracy Dixon, female    DOB: 1941/09/16,  MRN: [id number] ? ?Tracy Dixon presents to clinic today for painful porokeratotic lesion(s) bilaterally and painful mycotic toenails that limit ambulation. Painful toenails interfere with ambulation. Aggravating factors include wearing enclosed shoe gear. Pain is relieved with periodic professional debridement. Painful porokeratotic lesions are aggravated when weightbearing with and without shoegear. Pain is relieved with periodic professional debridement. ? ?New problem(s): None.  ? ?PCP is [name], MD , and last visit was January 19, 2022. ? ?Allergies  ?Allergen Reactions  ? Contrast Media [Iodinated Contrast Media] Hives  ? Crab [Shellfish Allergy] Hives  ? ? ?Review of Systems: Negative except as noted in the HPI. ? ?Objective: No changes noted in today's physical examination. ?. ?Constitutional Tracy Dixon is a pleasant 81 y.o. African American female, WD, WN in NAD. AAO x 3.   ?Vascular Capillary refill time to digits immediate b/l. Palpable pedal pulses b/l LE. Pedal hair sparse. No pain with calf compression b/l. Lower extremity skin temperature gradient within normal limits. No edema noted b/l LE. Lymphedema present left lower extremity.  ?Neurologic Normal speech. Oriented to person, place, and time. Protective sensation intact 5/5 intact bilaterally with 10g monofilament b/l. Vibratory sensation intact b/l.  ?Dermatologic Pedal integument with normal turgor, texture and tone BLE. No open wounds b/l LE. No interdigital macerations noted b/l LE. Toenails 1-5 b/l elongated, discolored, dystrophic, thickened, crumbly with subungual debris and tenderness to dorsal palpation. Porokeratotic lesion(s) R 3rd toe and submet head 2 right foot. No erythema, no edema, no drainage, no fluctuance.  ?Orthopedic: Normal muscle strength 5/5 to all lower extremity muscle groups bilaterally. Hammertoe deformity noted 2-5 b/l.Marland Kitchen No pain,  crepitus or joint limitation noted with ROM b/l LE.  Patient ambulates independently without assistive aids.  ? ?Radiographs: None ? ?  Latest Ref Rng & Units 01/09/2022  ? 11:11 AM  ?Hemoglobin A1C  ?Hemoglobin-A1c 4.6 - 6.5 % 6.0    ? ? ?Assessment/Plan: ?1. Pain due to onychomycosis of toenails of both feet   ?2. Porokeratosis   ?3. Right foot pain   ?  ?-Patient was evaluated and treated. All patient's and/or POA's questions/concerns answered on today's visit. ?-Examined patient. ?-Medicare ABN on file for approval of paring of corns/calluses. ?-Toenails 1-5 b/l were debrided in length and girth with sterile nail nippers and dremel without iatrogenic bleeding.  ?-Painful porokeratotic lesion(s) R 3rd toe and submet head 2 right foot pared and enucleated with sterile scalpel blade without incident. Total number of lesions debrided=2. ?-Patient/POA to call should there be question/concern in the interim.  ? ?Return in about 9 weeks (around 05/15/2022). ? ?Marzetta Board, DPM  ?

## 2022-03-23 DIAGNOSIS — Z20822 Contact with and (suspected) exposure to covid-19: Secondary | ICD-10-CM | POA: Diagnosis not present

## 2022-03-24 DIAGNOSIS — Z20822 Contact with and (suspected) exposure to covid-19: Secondary | ICD-10-CM | POA: Diagnosis not present

## 2022-03-26 DIAGNOSIS — Z20822 Contact with and (suspected) exposure to covid-19: Secondary | ICD-10-CM | POA: Diagnosis not present

## 2022-05-14 DIAGNOSIS — M5412 Radiculopathy, cervical region: Secondary | ICD-10-CM | POA: Diagnosis not present

## 2022-05-14 DIAGNOSIS — M5416 Radiculopathy, lumbar region: Secondary | ICD-10-CM | POA: Diagnosis not present

## 2022-05-14 DIAGNOSIS — M961 Postlaminectomy syndrome, not elsewhere classified: Secondary | ICD-10-CM | POA: Diagnosis not present

## 2022-05-21 ENCOUNTER — Ambulatory Visit (INDEPENDENT_AMBULATORY_CARE_PROVIDER_SITE_OTHER): Payer: Medicare Other | Admitting: Podiatry

## 2022-05-21 ENCOUNTER — Encounter: Payer: Self-pay | Admitting: Podiatry

## 2022-05-21 DIAGNOSIS — Q828 Other specified congenital malformations of skin: Secondary | ICD-10-CM | POA: Diagnosis not present

## 2022-05-21 DIAGNOSIS — M79675 Pain in left toe(s): Secondary | ICD-10-CM | POA: Diagnosis not present

## 2022-05-21 DIAGNOSIS — M79674 Pain in right toe(s): Secondary | ICD-10-CM

## 2022-05-21 DIAGNOSIS — M79671 Pain in right foot: Secondary | ICD-10-CM

## 2022-05-21 DIAGNOSIS — B351 Tinea unguium: Secondary | ICD-10-CM | POA: Diagnosis not present

## 2022-05-28 NOTE — Progress Notes (Signed)
  Subjective:  Patient ID: Tracy Dixon, female    DOB: [birth year],  MRN: [id number]  Tracy Dixon presents to clinic today for painful porokeratotic lesion(s) b/l lower extremities and painful mycotic toenails that limit ambulation. Painful toenails interfere with ambulation. Aggravating factors include wearing enclosed shoe gear. Pain is relieved with periodic professional debridement. Painful porokeratotic lesions are aggravated when weightbearing with and without shoegear. Pain is relieved with periodic professional debridement.  New problem(s): None.   PCP is [name], MD , and last visit was January 19, 2022.  Allergies  Allergen Reactions   Contrast Media [Iodinated Contrast Media] Hives   Crab [Shellfish Allergy] Hives    Review of Systems: Negative except as noted in the HPI.  Objective: No changes noted in today's physical examination. Constitutional Tracy Dixon is a pleasant 81 y.o. African American female, WD, WN in NAD. AAO x 3.   Vascular Capillary refill time to digits immediate b/l. Palpable pedal pulses b/l LE. Pedal hair sparse. No pain with calf compression b/l. Lower extremity skin temperature gradient within normal limits. No edema noted b/l LE. Lymphedema present left lower extremity.  Neurologic Normal speech. Oriented to person, place, and time. Protective sensation intact 5/5 intact bilaterally with 10g monofilament b/l. Vibratory sensation intact b/l.  Dermatologic Pedal integument with normal turgor, texture and tone BLE. No open wounds b/l LE. No interdigital macerations noted b/l LE. Toenails 1-5 b/l elongated, discolored, dystrophic, thickened, crumbly with subungual debris and tenderness to dorsal palpation. Porokeratotic lesion(s) R 3rd toe, plantarlateral midfoot x 2 and submet head 2 right foot. No erythema, no edema, no drainage, no fluctuance.  Orthopedic: Normal muscle strength 5/5 to all lower extremity muscle groups bilaterally. Hammertoe  deformity noted 2-5 b/l.Marland Kitchen No pain, crepitus or joint limitation noted with ROM b/l LE.  Patient ambulates independently without assistive aids.   Radiographs: None    Latest Ref Rng & Units 01/09/2022   11:11 AM  Hemoglobin A1C  Hemoglobin-A1c 4.6 - 6.5 % 6.0    Assessment/Plan: 1. Pain due to onychomycosis of toenails of both feet   2. Porokeratosis   3. Right foot pain      -Examined patient. -Medicare ABN signed. Patient consents for services of paring of corn(s)/callus(es)/porokeratos(es) today. Copy in patient chart. -Patient to continue soft, supportive shoe gear daily. -Mycotic toenails 1-5 bilaterally were debrided in length and girth with sterile nail nippers and dremel without incident. -Porokeratotic lesion(s) R 3rd toe, plantarlateral midfoot x 2 and submet head 2 right foot pared and enucleated with sterile scalpel blade without incident. Total number of lesions debrided=4. -Patient/POA to call should there be question/concern in the interim.   Return in about 9 weeks (around 07/23/2022).  Marzetta Board, DPM

## 2022-05-31 DIAGNOSIS — M5416 Radiculopathy, lumbar region: Secondary | ICD-10-CM | POA: Diagnosis not present

## 2022-06-11 DIAGNOSIS — M961 Postlaminectomy syndrome, not elsewhere classified: Secondary | ICD-10-CM | POA: Diagnosis not present

## 2022-06-11 DIAGNOSIS — M5416 Radiculopathy, lumbar region: Secondary | ICD-10-CM | POA: Diagnosis not present

## 2022-06-11 DIAGNOSIS — M5412 Radiculopathy, cervical region: Secondary | ICD-10-CM | POA: Diagnosis not present

## 2022-07-04 DIAGNOSIS — M13831 Other specified arthritis, right wrist: Secondary | ICD-10-CM | POA: Diagnosis not present

## 2022-07-04 DIAGNOSIS — M5136 Other intervertebral disc degeneration, lumbar region: Secondary | ICD-10-CM | POA: Diagnosis not present

## 2022-07-04 DIAGNOSIS — R200 Anesthesia of skin: Secondary | ICD-10-CM | POA: Diagnosis not present

## 2022-07-04 DIAGNOSIS — M13849 Other specified arthritis, unspecified hand: Secondary | ICD-10-CM | POA: Diagnosis not present

## 2022-07-04 DIAGNOSIS — M5412 Radiculopathy, cervical region: Secondary | ICD-10-CM | POA: Diagnosis not present

## 2022-07-04 DIAGNOSIS — M65332 Trigger finger, left middle finger: Secondary | ICD-10-CM | POA: Diagnosis not present

## 2022-07-04 DIAGNOSIS — M961 Postlaminectomy syndrome, not elsewhere classified: Secondary | ICD-10-CM | POA: Diagnosis not present

## 2022-07-25 ENCOUNTER — Ambulatory Visit: Payer: Medicare Other | Admitting: Podiatry

## 2022-07-31 ENCOUNTER — Encounter: Payer: Self-pay | Admitting: Family Medicine

## 2022-07-31 ENCOUNTER — Encounter: Payer: Self-pay | Admitting: Emergency Medicine

## 2022-07-31 ENCOUNTER — Ambulatory Visit
Admission: EM | Admit: 2022-07-31 | Discharge: 2022-07-31 | Disposition: A | Discharge status: Home / Self Care | Payer: Medicare Other | Attending: Emergency Medicine | Admitting: Emergency Medicine

## 2022-07-31 DIAGNOSIS — N390 Urinary tract infection, site not specified: Secondary | ICD-10-CM

## 2022-07-31 LAB — POCT URINALYSIS DIP (MANUAL ENTRY)
Glucose, UA: 100 mg/dL — AB
Ketones, POC UA: NEGATIVE mg/dL
Nitrite, UA: POSITIVE — AB
Protein Ur, POC: >=300 mg/dL — AB
Spec Grav, UA: 1.025 (ref 1.010–1.025)
Urobilinogen, UA: 1 E.U./dL
pH, UA: 5 (ref 5.0–8.0)

## 2022-07-31 MED ORDER — CEPHALEXIN 500 MG PO CAPS: 500.0000 mg | ORAL_CAPSULE | Freq: Two times a day (BID) | ORAL | 0 refills | Status: AC

## 2022-07-31 NOTE — Discharge Instructions (Addendum)
Take the antibiotic as directed.  The urine culture is pending.  We will call you if it shows the need to change or discontinue your antibiotic.    Follow up with your primary care provider if your symptoms are not improving.    

## 2022-07-31 NOTE — ED Provider Notes (Signed)
[facility]    CSN: [id number] Arrival date & time: 07/31/22  1320      History   Chief Complaint No chief complaint on file.   HPI Tracy Dixon is a 81 y.o. female.  Patient presents with dysuria and frequency since yesterday.  She denies fever, chills, abdominal pain, hematuria, flank pain, pelvic pain, or other symptoms.  Treatment at home with Pyridium that she had leftover from previous illness.  She contacted her PCP today and is scheduled to see her tomorrow but her symptoms are getting worse so she came to UC.  The history is provided by the patient and medical records.    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[facility])    followed by [facility], dx in [place]   Allergy    Arthritis    Asthma    Complication of anesthesia    stays sleepy   Diverticulosis of colon    Gallstones    Hyperlipidemia    Hypertension    Osteoarthritis    Symptomatic mammary hypertrophy     Patient Active Problem List   Diagnosis Date Noted   Arthritis of hand 03/19/2022   Cervical radiculopathy 07/26/2021   Acquired trigger finger of left middle finger 07/06/2021   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   Change in consistency of stool 03/01/2021   Gastroesophageal reflux disease 12/13/2020   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   Pain and swelling of left lower leg 03/24/2020   Vitamin D deficiency 01/21/2020   Prediabetes 01/21/2020   Fat necrosis of breast 04/17/2019   History of bilateral breast reduction surgery 08/27/2018   Chronic venous insufficiency 05/12/2018   Lymphedema 05/12/2018   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[place]) 42/70/6237   Metabolic syndrome 62/83/1517   Counseling regarding end of life decision making 09/30/2014   Spinal stenosis of lumbar region 03/17/2013   Osteopenia 05/23/2009   Sciatica of left side associated with disorder of lumbar spine 09/25/2007   High cholesterol 09/22/2007   OBESITY 09/22/2007    Essential hypertension, benign 09/22/2007   Varicose veins with pain 09/22/2007   ALLERGIC RHINITIS 09/22/2007   Mild intermittent asthma 09/22/2007   DIVERTICULOSIS, COLON 09/22/2007   DJD (degenerative joint disease) 09/22/2007    Past Surgical History:  Procedure Laterality Date   Bilateral tubal ligation  2005   BREAST REDUCTION SURGERY Bilateral 06/04/2018   Procedure: MAMMARY REDUCTION  (BREAST);  Surgeon: Wallace Going, DO;  Location: [place];  Service: Plastics;  Laterality: Bilateral;   Carotid doppler  07/2006   Neg   CESAREAN SECTION  2005   CHOLECYSTECTOMY  1982   DECOMPRESSIVE LUMBAR LAMINECTOMY LEVEL 3  06/10/2013   Procedure: DECOMPRESSIVE LUMBAR LAMINECTOMY LEVEL 3;  Surgeon: Melina Schools, MD;  Location: [place];  Service: Orthopedics;;  lumbar three to five decompression   MRI brain  2007   acoustic neuroma (check yearly with MRI)   PFT's  12/2006   (-) FEV1/FVC 79%, FEV1 84%   REDUCTION MAMMAPLASTY Bilateral 05/2018   REPLACEMENT TOTAL KNEE BILATERAL  2005   TONSILLECTOMY  2005   TUBAL LIGATION      OB History   No obstetric history on file.      Home Medications    Prior to Admission medications   Medication Sig Start Date End Date Taking? Authorizing Provider  cephALEXin (KEFLEX) 500 MG capsule Take 1 capsule (500 mg total) by mouth 2 (two) times daily for 5  days. 07/31/22 08/05/22 Yes Sharion Balloon, NP  albuterol (VENTOLIN HFA) 108 (90 Base) MCG/ACT inhaler albuterol sulfate HFA 90 mcg/actuation aerosol inhaler  INHALE 1 TO 2 PUFFS BY MOUTH EVERY 6 HOURS AS NEEDED FOR SHORTNESS OF BREATH (FOR USE WHEN IN CONTACT WITH DOGS OR CATS)    [provider]  amLODipine (NORVASC) 5 MG tablet TAKE 1 TABLET BY MOUTH DAILY 03/14/22   Bedsole, Amy E, MD  atorvastatin (LIPITOR) 20 MG tablet TAKE 1 TABLET BY MOUTH ONCE  DAILY 02/27/22   Bedsole, Amy E, MD  cetirizine (ZYRTEC) 10 MG tablet Take 10 mg by mouth daily as needed for allergies.  Reported on 01/10/2016    [provider]  cholecalciferol (VITAMIN D) 1000 UNITS tablet Take 1,000 Units by mouth daily.    [provider]  cyclobenzaprine (FLEXERIL) 5 MG tablet cyclobenzaprine 5 mg tablet  TAKE 1 TABLET BY MOUTH THREE TIMES DAILY AS NEEDED FOR MUSCLE SPASM    [provider]  hydrochlorothiazide (MICROZIDE) 12.5 MG capsule TAKE 1 CAPSULE BY MOUTH  DAILY 01/22/22   Bedsole, Amy E, MD  omeprazole (PRILOSEC OTC) 20 MG tablet Take 20 mg by mouth daily.    [provider]    Family History Family History  Problem Relation Age of Onset   Cancer Mother        Lung CA   Stroke Mother        CVA   Cancer Father        Prostate   Heart disease Neg Hx    Breast cancer Neg Hx     Social History Social History   Tobacco Use   Smoking status: Former    Years: 15.00    Types: Cigarettes    Quit date: 06/02/1973    Years since quitting: 49.1   Smokeless tobacco: Never   Tobacco comments:    few cigs/day  Vaping Use   Vaping Use: Never used  Substance Use Topics   Alcohol use: No   Drug use: No     Allergies   Contrast media [iodinated contrast media] and Crab [shellfish allergy]   Review of Systems Review of Systems  Constitutional:  Negative for chills and fever.  Gastrointestinal:  Negative for abdominal pain, nausea and vomiting.  Genitourinary:  Positive for dysuria and frequency. Negative for hematuria and pelvic pain.  Skin:  Negative for color change and rash.  All other systems reviewed and are negative.    Physical Exam Triage Vital Signs ED Triage Vitals  Enc Vitals Group     BP      Pulse      Resp      Temp      Temp src      SpO2      Weight      Height      Head Circumference      Peak Flow      Pain Score      Pain Loc      Pain Edu?      Excl. in [place]?    No data found.  Updated Vital Signs BP 128/70   Pulse (!) 103   Temp 98.2 F (36.8 C)   Resp 18   SpO2 96%   Visual Acuity Right  Eye Distance:   Left Eye Distance:   Bilateral Distance:    Right Eye Near:   Left Eye Near:    Bilateral Near:     Physical Exam  Vitals and nursing note reviewed.  Constitutional:      General: She is not in acute distress.    Appearance: She is well-developed. She is not ill-appearing.  HENT:     Mouth/Throat:     Mouth: Mucous membranes are moist.  Cardiovascular:     Rate and Rhythm: Normal rate and regular rhythm.     Heart sounds: Normal heart sounds.  Pulmonary:     Effort: Pulmonary effort is normal. No respiratory distress.     Breath sounds: Normal breath sounds.  Abdominal:     General: Bowel sounds are normal.     Palpations: Abdomen is soft.     Tenderness: There is no abdominal tenderness. There is no right CVA tenderness, left CVA tenderness, guarding or rebound.  Musculoskeletal:     Cervical back: Neck supple.  Skin:    General: Skin is warm and dry.  Neurological:     Mental Status: She is alert.  Psychiatric:        Mood and Affect: Mood normal.        Behavior: Behavior normal.      UC Treatments / Results  Labs (all labs ordered are listed, but only abnormal results are displayed) Labs Reviewed  POCT URINALYSIS DIP (MANUAL ENTRY) - Abnormal; Notable for the following components:      Result Value   Color, UA orange (*)    Clarity, UA cloudy (*)    Glucose, UA =100 (*)    Bilirubin, UA small (*)    Blood, UA moderate (*)    Protein Ur, POC >=300 (*)    Nitrite, UA Positive (*)    Leukocytes, UA Large (3+) (*)    All other components within normal limits  URINE CULTURE    EKG   Radiology No results found.  Procedures Procedures (including critical care time)  Medications Ordered in UC Medications - No data to display  Initial Impression / Assessment and Plan / UC Course  I have reviewed the triage vital signs and the nursing notes.  Pertinent labs & imaging results that were available during my care of the patient were  reviewed by me and considered in my medical decision making (see chart for details).    UTI.  Treating with Keflex. Urine culture pending. Discussed with patient that we will call her if the urine culture shows the need to change or discontinue the antibiotic. Instructed her to follow-up with her PCP if her symptoms are not improving. Patient agrees to plan of care.     Final Clinical Impressions(s) / UC Diagnoses   Final diagnoses:  Urinary tract infection without hematuria, site unspecified     Discharge Instructions      Take the antibiotic as directed.  The urine culture is pending.  We will call you if it shows the need to change or discontinue your antibiotic.    Follow up with your primary care provider if your symptoms are not improving.        ED Prescriptions     Medication Sig Dispense Auth. Provider   cephALEXin (KEFLEX) 500 MG capsule Take 1 capsule (500 mg total) by mouth 2 (two) times daily for 5 days. 10 capsule Sharion Balloon, NP      PDMP not reviewed this encounter.   Sharion Balloon, NP 07/31/22 1353

## 2022-08-01 ENCOUNTER — Ambulatory Visit: Payer: Medicare Other | Admitting: Family Medicine

## 2022-08-02 LAB — URINE CULTURE: Culture: >=100000 — AB

## 2022-08-15 ENCOUNTER — Encounter: Payer: Self-pay | Admitting: Family Medicine

## 2022-08-21 ENCOUNTER — Ambulatory Visit (INDEPENDENT_AMBULATORY_CARE_PROVIDER_SITE_OTHER): Payer: Medicare Other

## 2022-08-21 DIAGNOSIS — Z23 Encounter for immunization: Secondary | ICD-10-CM | POA: Diagnosis not present

## 2022-08-24 ENCOUNTER — Encounter: Payer: Self-pay | Admitting: Podiatry

## 2022-08-24 ENCOUNTER — Ambulatory Visit (INDEPENDENT_AMBULATORY_CARE_PROVIDER_SITE_OTHER): Payer: Medicare Other | Admitting: Podiatry

## 2022-08-24 DIAGNOSIS — M79671 Pain in right foot: Secondary | ICD-10-CM | POA: Diagnosis not present

## 2022-08-24 DIAGNOSIS — M79675 Pain in left toe(s): Secondary | ICD-10-CM | POA: Diagnosis not present

## 2022-08-24 DIAGNOSIS — M79674 Pain in right toe(s): Secondary | ICD-10-CM

## 2022-08-24 DIAGNOSIS — Q828 Other specified congenital malformations of skin: Secondary | ICD-10-CM

## 2022-08-24 DIAGNOSIS — B351 Tinea unguium: Secondary | ICD-10-CM

## 2022-08-24 NOTE — Progress Notes (Unsigned)
  Subjective:  Patient ID: Tracy Dixon, female    DOB: March 21, 1941,  MRN: [id number]  Tracy Dixon presents to clinic today for {jgcomplaint:23593}  Patient states blood glucose was *** mg/dl {Time; today/yesterday/ 2 days ago:19188}.  Last known  HgA1c was ***%.    Last known HgA1c was unknown.  Patient did not check blood glucose this morning.  Patient does not monitor blood glucose daily.  Patient is not required to monitor blood glucose daily.  New problem(s): None. {jgcomplaint:23593}  PCP is Bedsole, Amy E, MD , and last visit was  {Time; dates multiple:15870}.  Allergies  Allergen Reactions   Contrast Media [Iodinated Contrast Media] Hives   Crab Extract Allergy Skin Test     Other reaction(s): Not available   Crab [Shellfish Allergy] Hives    Review of Systems: Negative except as noted in the HPI.  Objective: No changes noted in today's physical examination.  Tracy Dixon is a pleasant 81 y.o. female WD, WN in NAD. AAO x 3. Vascular Capillary refill time to digits immediate b/l. Palpable pedal pulses b/l LE. Pedal hair sparse. No pain with calf compression b/l. Lower extremity skin temperature gradient within normal limits. No edema noted b/l LE. Lymphedema present left lower extremity.  Neurologic Normal speech. Oriented to person, place, and time. Protective sensation intact 5/5 intact bilaterally with 10g monofilament b/l. Vibratory sensation intact b/l.  Dermatologic Pedal integument with normal turgor, texture and tone BLE. No open wounds b/l LE. No interdigital macerations noted b/l LE. Toenails 1-5 b/l elongated, discolored, dystrophic, thickened, crumbly with subungual debris and tenderness to dorsal palpation. Porokeratotic lesion(s) R 3rd toe, plantarlateral midfoot x 2 and submet head 2 right foot. No erythema, no edema, no drainage, no fluctuance.  Orthopedic: Normal muscle strength 5/5 to all lower extremity muscle groups bilaterally. Hammertoe  deformity noted 2-5 b/l.Marland Kitchen No pain, crepitus or joint limitation noted with ROM b/l LE.  Patient ambulates independently without assistive aids.   Radiographs: None Assessment/Plan: No diagnosis found.  No orders of the defined types were placed in this encounter. No orders of the defined types were placed in this encounter.   -Medicare ABN signed for services of paring of corn(s)/callus(es)/porokeratos(es). Copy in patient chart. -Patient/POA to call should there be question/concern in the interim.   Return in about 3 months (around 11/23/2022).  Marzetta Board, DPM

## 2022-08-27 DIAGNOSIS — M47896 Other spondylosis, lumbar region: Secondary | ICD-10-CM | POA: Diagnosis not present

## 2022-09-05 DIAGNOSIS — M65332 Trigger finger, left middle finger: Secondary | ICD-10-CM | POA: Diagnosis not present

## 2022-09-05 HISTORY — PX: TRIGGER FINGER RELEASE: SHX641

## 2022-09-10 ENCOUNTER — Encounter: Payer: Self-pay | Admitting: Family Medicine

## 2022-09-11 DIAGNOSIS — M47816 Spondylosis without myelopathy or radiculopathy, lumbar region: Secondary | ICD-10-CM | POA: Diagnosis not present

## 2022-09-19 DIAGNOSIS — M25642 Stiffness of left hand, not elsewhere classified: Secondary | ICD-10-CM | POA: Diagnosis not present

## 2022-09-24 ENCOUNTER — Encounter (INDEPENDENT_AMBULATORY_CARE_PROVIDER_SITE_OTHER): Payer: Self-pay

## 2022-09-24 DIAGNOSIS — M47816 Spondylosis without myelopathy or radiculopathy, lumbar region: Secondary | ICD-10-CM | POA: Diagnosis not present

## 2022-10-01 DIAGNOSIS — G90529 Complex regional pain syndrome I of unspecified lower limb: Secondary | ICD-10-CM | POA: Diagnosis not present

## 2022-10-01 DIAGNOSIS — M545 Low back pain, unspecified: Secondary | ICD-10-CM | POA: Diagnosis not present

## 2022-10-22 DIAGNOSIS — G90522 Complex regional pain syndrome I of left lower limb: Secondary | ICD-10-CM | POA: Diagnosis not present

## 2022-10-24 ENCOUNTER — Encounter: Payer: Self-pay | Admitting: Family Medicine

## 2022-10-25 ENCOUNTER — Ambulatory Visit (INDEPENDENT_AMBULATORY_CARE_PROVIDER_SITE_OTHER): Payer: Medicare Other | Admitting: Family Medicine

## 2022-10-25 ENCOUNTER — Encounter: Payer: Self-pay | Admitting: Family Medicine

## 2022-10-25 ENCOUNTER — Ambulatory Visit (INDEPENDENT_AMBULATORY_CARE_PROVIDER_SITE_OTHER)
Admission: RE | Admit: 2022-10-25 | Discharge: 2022-10-25 | Disposition: A | Discharge status: Home / Self Care | Payer: Medicare Other | Source: Ambulatory Visit | Attending: Family Medicine | Admitting: Family Medicine

## 2022-10-25 VITALS — BP 140/70 | HR 98 | Temp 98.0°F | Ht 60.0 in | Wt 176.2 lb

## 2022-10-25 DIAGNOSIS — R0781 Pleurodynia: Secondary | ICD-10-CM | POA: Diagnosis not present

## 2022-10-25 DIAGNOSIS — M954 Acquired deformity of chest and rib: Secondary | ICD-10-CM | POA: Diagnosis not present

## 2022-10-25 NOTE — Progress Notes (Signed)
Dashauna Heymann T. Ansar Skoda, MD, [place] at [facility] [place], [address]  Phone: [phone]  FAX: [phone]  Tracy Dixon - 81 y.o. female  MRN [id number]  Date of Birth: 1941/05/06  Date: 10/25/2022  PCP: [name], MD  Referral: [name], MD  Chief Complaint  Patient presents with   Rib Pain-Right Side   Subjective:   Tracy Dixon is a 81 y.o. very pleasant female patient with Body mass index is 34.42 kg/m. who presents with the following:  81 year old patient presents with rib pain.  Has really bad back problems, and she was setting ESI.  Has had four rounds of ESI.    On Monday, had a spine injection, and now she has unbearable pain.  Pulling her L leg will hurt really bad.  She reports a history where after her epidural steroid injection she had numbness in the bilateral lower extremities and she required a significant amount of manipulation on the examination table by provider staff at that time.  She also was lifting and twisting a toe that was filled with wreaths for decoration, and since that time she has been having some significant pain on the right side of her rib cage.  She has not any bruising or any other direct trauma that she can recall  Taking some ibuprofen, gabapentin.   Was numb at the time of her injection, B LE    Review of Systems is noted in the HPI, as appropriate  Objective:   BP (!) 140/70   Pulse 98   Temp 98 F (36.7 C) (Oral)   Ht 5' (1.524 m)   Wt 176 lb 4 oz (79.9 kg)   SpO2 98%   BMI 34.42 kg/m   GEN: No acute distress; alert,appropriate. PULM: Breathing comfortably in no respiratory distress PSYCH: Normally interactive.   Roughly in the region of rib 10 and 9 patient has some significant tenderness to palpation on the lateral aspect.  She has no tenderness whatsoever on the left side of her chest wall.  No tenderness at the sternum.  No tenderness  in the right upper quadrant.  Laboratory and Imaging Data:  Assessment and Plan:     ICD-10-CM   1. Rib pain  R07.81 DG Ribs Unilateral Right     I do not appreciate a fracture on the plain film.  Supportive rib muscular strain.  Impossible to know if this occurred at the time post epidural steroid injection or from her lifting and twisting a heavy object.  Nevertheless, I think that this will resolve without much intervention.  Think that she just needs time to recover, and she is going to take some ibuprofen and Tylenol for pain until then.  Medication Management during today's office visit: No orders of the defined types were placed in this encounter.  Medications Discontinued During This Encounter  Medication Reason   cyclobenzaprine (FLEXERIL) 5 MG tablet Completed Course    Orders placed today for conditions managed today: Orders Placed This Encounter  Procedures   DG Ribs Unilateral Right    Disposition: No follow-ups on file.  [employer] One speech-to-text software was used for transcription in this dictation.  Possible transcriptional errors can occur using Editor, commissioning.   Signed,  Maud Deed. Alister Staver, MD   Outpatient Encounter Medications as of 10/25/2022  Medication Sig   albuterol (VENTOLIN HFA) 108 (90 Base) MCG/ACT inhaler albuterol sulfate HFA 90 mcg/actuation aerosol  inhaler  INHALE 1 TO 2 PUFFS BY MOUTH EVERY 6 HOURS AS NEEDED FOR SHORTNESS OF BREATH (FOR USE WHEN IN CONTACT WITH DOGS OR CATS)   amLODipine (NORVASC) 5 MG tablet TAKE 1 TABLET BY MOUTH DAILY   atorvastatin (LIPITOR) 20 MG tablet TAKE 1 TABLET BY MOUTH ONCE  DAILY   cetirizine (ZYRTEC) 10 MG tablet Take 10 mg by mouth daily as needed for allergies. Reported on 01/10/2016   cholecalciferol (VITAMIN D) 1000 UNITS tablet Take 1,000 Units by mouth daily.   hydrochlorothiazide (MICROZIDE) 12.5 MG capsule TAKE 1 CAPSULE BY MOUTH  DAILY   omeprazole (PRILOSEC OTC) 20 MG tablet Take 20 mg by  mouth daily.   [DISCONTINUED] cyclobenzaprine (FLEXERIL) 5 MG tablet cyclobenzaprine 5 mg tablet  TAKE 1 TABLET BY MOUTH THREE TIMES DAILY AS NEEDED FOR MUSCLE SPASM   No facility-administered encounter medications on file as of 10/25/2022.

## 2022-10-26 DIAGNOSIS — H2511 Age-related nuclear cataract, right eye: Secondary | ICD-10-CM | POA: Diagnosis not present

## 2022-11-02 ENCOUNTER — Ambulatory Visit (INDEPENDENT_AMBULATORY_CARE_PROVIDER_SITE_OTHER): Payer: Medicare Other | Admitting: Podiatry

## 2022-11-02 ENCOUNTER — Encounter: Payer: Self-pay | Admitting: Podiatry

## 2022-11-02 VITALS — BP 157/69

## 2022-11-02 DIAGNOSIS — M79671 Pain in right foot: Secondary | ICD-10-CM | POA: Diagnosis not present

## 2022-11-02 DIAGNOSIS — M79675 Pain in left toe(s): Secondary | ICD-10-CM | POA: Diagnosis not present

## 2022-11-02 DIAGNOSIS — M79674 Pain in right toe(s): Secondary | ICD-10-CM

## 2022-11-02 DIAGNOSIS — B351 Tinea unguium: Secondary | ICD-10-CM | POA: Diagnosis not present

## 2022-11-02 DIAGNOSIS — Q828 Other specified congenital malformations of skin: Secondary | ICD-10-CM

## 2022-11-02 NOTE — Progress Notes (Unsigned)
  Subjective:  Patient ID: Tracy Dixon, female    DOB: 06-27-1941,  MRN: [id number]  Tracy Dixon presents to clinic today for {jgcomplaint:23593}  Chief Complaint  Patient presents with   Nail Problem    [facility] PCP-Bedsole, Amy PCP V[facility]-last week   New problem(s): None. {jgcomplaint:23593}  PCP is Diona Browner, Amy E, MD.  Allergies  Allergen Reactions   Contrast Media [Iodinated Contrast Media] Hives   Crab Extract Allergy Skin Test     Other reaction(s): Not available   Crab [Shellfish Allergy] Hives    Review of Systems: Negative except as noted in the HPI.  Objective: No changes noted in today's physical examination. There were no vitals filed for this visit. Tracy Dixon is a pleasant 81 y.o. female WD, WN in NAD. AAO x 3. Vascular Capillary refill time to digits immediate b/l. Palpable pedal pulses b/l LE. Pedal hair sparse. No pain with calf compression b/l. Lower extremity skin temperature gradient within normal limits. No edema noted b/l LE. Lymphedema present left lower extremity.  Neurologic Normal speech. Oriented to person, place, and time. Protective sensation intact 5/5 intact bilaterally with 10g monofilament b/l. Vibratory sensation intact b/l.  Dermatologic Pedal integument with normal turgor, texture and tone BLE. No open wounds b/l LE. No interdigital macerations noted b/l LE. Toenails 1-5 b/l elongated, discolored, dystrophic, thickened, crumbly with subungual debris and tenderness to dorsal palpation.   Porokeratotic lesion(s) R 3rd toe, plantarlateral midfoot x 2 and submet head 2 right foot. No erythema, no edema, no drainage, no fluctuance.  Orthopedic: Normal muscle strength 5/5 to all lower extremity muscle groups bilaterally. Hammertoe deformity noted 2-5 b/l.Marland Kitchen No pain, crepitus or joint limitation noted with ROM b/l LE.  Patient ambulates independently without assistive aids.   Radiographs: None  Assessment/Plan: No diagnosis found.  No orders of  the defined types were placed in this encounter.   None {Jgplan:23602::"-Patient/POA to call should there be question/concern in the interim."}   Return in about 3 months (around 02/01/2023).  Marzetta Board, DPM

## 2022-11-03 ENCOUNTER — Ambulatory Visit
Admission: RE | Admit: 2022-11-03 | Discharge: 2022-11-03 | Disposition: A | Discharge status: Home / Self Care | Payer: Medicare Other | Source: Ambulatory Visit | Attending: Urgent Care | Admitting: Urgent Care

## 2022-11-03 VITALS — BP 140/79 | HR 100 | Temp 97.7°F | Resp 16

## 2022-11-03 DIAGNOSIS — N3001 Acute cystitis with hematuria: Secondary | ICD-10-CM | POA: Diagnosis not present

## 2022-11-03 LAB — POCT URINALYSIS DIP (MANUAL ENTRY)
Bilirubin, UA: NEGATIVE
Glucose, UA: NEGATIVE mg/dL
Ketones, POC UA: NEGATIVE mg/dL
Nitrite, UA: NEGATIVE
Protein Ur, POC: 100 mg/dL — AB
Spec Grav, UA: 1.015 (ref 1.010–1.025)
Urobilinogen, UA: 0.2 E.U./dL
pH, UA: 7 (ref 5.0–8.0)

## 2022-11-03 MED ORDER — CEPHALEXIN 500 MG PO CAPS: 500.0000 mg | ORAL_CAPSULE | Freq: Four times a day (QID) | ORAL | 0 refills | Status: AC

## 2022-11-03 NOTE — ED Triage Notes (Signed)
Pt. Presents to UC w/ c/o dysuria and urinary frequency for the past 2 days.

## 2022-11-03 NOTE — ED Provider Notes (Addendum)
Roderic Palau    CSN: [id number] Arrival date & time: 11/03/22  5462      History   Chief Complaint Chief Complaint  Patient presents with   Urinary Frequency    Entered by patient   Dysuria    HPI Tracy Dixon is a 81 y.o. female.    Urinary Frequency  Dysuria   Presents to urgent care with complaint of painful urination and frequency x 2 days.  Denies fever, chills, myalgias.  Denies abdominal pain, flank pain.  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[facility])    followed by [facility], dx in [place]   Allergy    Arthritis    Asthma    Complication of anesthesia    stays sleepy   Diverticulosis of colon    Gallstones    Hyperlipidemia    Hypertension    Osteoarthritis    Symptomatic mammary hypertrophy     Patient Active Problem List   Diagnosis Date Noted   Arthritis of hand 03/19/2022   Cervical radiculopathy 07/26/2021   Acquired trigger finger of left middle finger 07/06/2021   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   Change in consistency of stool 03/01/2021   Gastroesophageal reflux disease 12/13/2020   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   Pain and swelling of left lower leg 03/24/2020   Vitamin D deficiency 01/21/2020   Prediabetes 01/21/2020   Fat necrosis of breast 04/17/2019   History of bilateral breast reduction surgery 08/27/2018   Chronic venous insufficiency 05/12/2018   Lymphedema 05/12/2018   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[place]) 70/35/0093   Metabolic syndrome 81/82/9937   Counseling regarding end of life decision making 09/30/2014   Spinal stenosis of lumbar region 03/17/2013   Osteopenia 05/23/2009   Sciatica of left side associated with disorder of lumbar spine 09/25/2007   High cholesterol 09/22/2007   OBESITY 09/22/2007   Essential hypertension, benign 09/22/2007   Varicose veins with pain 09/22/2007   ALLERGIC RHINITIS 09/22/2007   Mild intermittent asthma 09/22/2007    DIVERTICULOSIS, COLON 09/22/2007   DJD (degenerative joint disease) 09/22/2007    Past Surgical History:  Procedure Laterality Date   Bilateral tubal ligation  2005   BREAST REDUCTION SURGERY Bilateral 06/04/2018   Procedure: MAMMARY REDUCTION  (BREAST);  Surgeon: Wallace Going, DO;  Location: [address];  Service: Plastics;  Laterality: Bilateral;   Carotid doppler  07/2006   Neg   CESAREAN SECTION  2005   CHOLECYSTECTOMY  1982   DECOMPRESSIVE LUMBAR LAMINECTOMY LEVEL 3  06/10/2013   Procedure: DECOMPRESSIVE LUMBAR LAMINECTOMY LEVEL 3;  Surgeon: Melina Schools, MD;  Location: [place];  Service: Orthopedics;;  lumbar three to five decompression   MRI brain  2007   acoustic neuroma (check yearly with MRI)   PFT's  12/2006   (-) FEV1/FVC 79%, FEV1 84%   REDUCTION MAMMAPLASTY Bilateral 05/2018   REPLACEMENT TOTAL KNEE BILATERAL  2005   TONSILLECTOMY  2005   TUBAL LIGATION      OB History   No obstetric history on file.      Home Medications    Prior to Admission medications   Medication Sig Start Date End Date Taking? Authorizing Provider  albuterol (VENTOLIN HFA) 108 (90 Base) MCG/ACT inhaler albuterol sulfate HFA 90 mcg/actuation aerosol inhaler  INHALE 1 TO 2 PUFFS BY MOUTH EVERY 6 HOURS AS NEEDED FOR SHORTNESS OF BREATH (FOR USE WHEN IN CONTACT WITH DOGS OR CATS)  [provider]  amLODipine (NORVASC) 5 MG tablet TAKE 1 TABLET BY MOUTH DAILY 03/14/22   Bedsole, Amy E, MD  atorvastatin (LIPITOR) 20 MG tablet TAKE 1 TABLET BY MOUTH ONCE  DAILY 02/27/22   Bedsole, Amy E, MD  cetirizine (ZYRTEC) 10 MG tablet Take 10 mg by mouth daily as needed for allergies. Reported on 01/10/2016    [provider]  cholecalciferol (VITAMIN D) 1000 UNITS tablet Take 1,000 Units by mouth daily.    [provider]  hydrochlorothiazide (MICROZIDE) 12.5 MG capsule TAKE 1 CAPSULE BY MOUTH  DAILY 01/22/22   Bedsole, Amy E, MD  omeprazole (PRILOSEC OTC) 20 MG  tablet Take 20 mg by mouth daily.    [provider]    Family History Family History  Problem Relation Age of Onset   Cancer Mother        Lung CA   Stroke Mother        CVA   Cancer Father        Prostate   Heart disease Neg Hx    Breast cancer Neg Hx     Social History Social History   Tobacco Use   Smoking status: Former    Years: 15.00    Types: Cigarettes    Quit date: 06/02/1973    Years since quitting: 49.4   Smokeless tobacco: Never   Tobacco comments:    few cigs/day  Vaping Use   Vaping Use: Never used  Substance Use Topics   Alcohol use: No   Drug use: No     Allergies   Contrast media [iodinated contrast media], Crab extract allergy skin test, and Crab [shellfish allergy]   Review of Systems Review of Systems  Genitourinary:  Positive for dysuria and frequency.     Physical Exam Triage Vital Signs ED Triage Vitals [11/03/22 0910]  Enc Vitals Group     BP (!) 140/79     Pulse Rate 100     Resp 16     Temp 97.7 F (36.5 C)     Temp src      SpO2 95 %     Weight      Height      Head Circumference      Peak Flow      Pain Score 0     Pain Loc      Pain Edu?      Excl. in [place]?    No data found.  Updated Vital Signs BP (!) 140/79   Pulse 100   Temp 97.7 F (36.5 C)   Resp 16   SpO2 95%   Visual Acuity Right Eye Distance:   Left Eye Distance:   Bilateral Distance:    Right Eye Near:   Left Eye Near:    Bilateral Near:     Physical Exam Vitals reviewed.  Constitutional:      Appearance: Normal appearance.  Skin:    General: Skin is warm and dry.  Neurological:     General: No focal deficit present.     Mental Status: She is alert and oriented to person, place, and time.  Psychiatric:        Mood and Affect: Mood normal.        Behavior: Behavior normal.      UC Treatments / Results  Labs (all labs ordered are listed, but only abnormal results are displayed) Labs Reviewed  POCT URINALYSIS DIP (MANUAL  ENTRY) - Abnormal; Notable for the following  components:      Result Value   Blood, UA small (*)    Protein Ur, POC =100 (*)    Leukocytes, UA Large (3+) (*)    All other components within normal limits    EKG   Radiology No results found.  Procedures Procedures (including critical care time)  Medications Ordered in UC Medications - No data to display  Initial Impression / Assessment and Plan / UC Course  I have reviewed the triage vital signs and the nursing notes.  Pertinent labs & imaging results that were available during my care of the patient were reviewed by me and considered in my medical decision making (see chart for details).   UA is positive for large leukocytes and small blood.  Will treat for acute cystitis with hematuria using cephalexin.  Will try to get additional urine to send for a culture to verify susceptibility (patient provided scant sample).  Final Clinical Impressions(s) / UC Diagnoses   Final diagnoses:  None   Discharge Instructions   None    ED Prescriptions   None    PDMP not reviewed this encounter.   Rose Phi, [place] 11/03/22 [place]    Rose Phi, [place] 11/03/22 1040

## 2022-11-03 NOTE — Discharge Instructions (Signed)
Follow up here or with your primary care provider if your symptoms are worsening or not improving with treatment.     

## 2022-11-05 LAB — URINE CULTURE: Culture: >=100000 — AB

## 2022-12-27 ENCOUNTER — Other Ambulatory Visit: Payer: Self-pay | Admitting: Family Medicine

## 2022-12-28 DIAGNOSIS — M25562 Pain in left knee: Secondary | ICD-10-CM | POA: Insufficient documentation

## 2023-01-04 DIAGNOSIS — M25562 Pain in left knee: Secondary | ICD-10-CM | POA: Diagnosis not present

## 2023-01-04 DIAGNOSIS — Z96653 Presence of artificial knee joint, bilateral: Secondary | ICD-10-CM | POA: Diagnosis not present

## 2023-01-11 ENCOUNTER — Ambulatory Visit (INDEPENDENT_AMBULATORY_CARE_PROVIDER_SITE_OTHER): Payer: Medicare Other | Admitting: Podiatry

## 2023-01-11 ENCOUNTER — Encounter: Payer: Self-pay | Admitting: Podiatry

## 2023-01-11 VITALS — BP 159/71

## 2023-01-11 DIAGNOSIS — M79671 Pain in right foot: Secondary | ICD-10-CM | POA: Diagnosis not present

## 2023-01-11 DIAGNOSIS — Q828 Other specified congenital malformations of skin: Secondary | ICD-10-CM | POA: Diagnosis not present

## 2023-01-11 DIAGNOSIS — M79674 Pain in right toe(s): Secondary | ICD-10-CM | POA: Diagnosis not present

## 2023-01-11 DIAGNOSIS — M79675 Pain in left toe(s): Secondary | ICD-10-CM

## 2023-01-11 DIAGNOSIS — B351 Tinea unguium: Secondary | ICD-10-CM | POA: Diagnosis not present

## 2023-01-11 NOTE — Progress Notes (Signed)
  Subjective:  Patient ID: Tracy Dixon, female    DOB: [birth year],  MRN: [id number]  Tracy Dixon presents to clinic today for painful porokeratotic lesion(s) b/l lower extremities and painful mycotic toenails that limit ambulation. Painful toenails interfere with ambulation. Aggravating factors include wearing enclosed shoe gear. Pain is relieved with periodic professional debridement. Painful porokeratotic lesions are aggravated when weightbearing with and without shoegear. Pain is relieved with periodic professional debridement.  Chief Complaint  Patient presents with   Nail Problem    RFC PCP-Bedsole PCP VST-1 Year ago   New problem(s): None.   PCP is [name], MD.  Allergies  Allergen Reactions   Contrast Media [Iodinated Contrast Media] Hives   Crab Extract Allergy Skin Test     Other reaction(s): Not available   Crab [Shellfish Allergy] Hives    Review of Systems: Negative except as noted in the HPI.  Objective: No changes noted in today's physical examination. Vitals:   01/11/23 0744 01/11/23 0801  BP: (!) 159/82 (!) 159/71   Tracy Dixon is a pleasant 82 y.o. female WD, WN in NAD. AAO x 3. Vascular Capillary refill time to digits immediate b/l. Palpable pedal pulses b/l LE. Pedal hair sparse. No pain with calf compression b/l. Lower extremity skin temperature gradient within normal limits. No edema noted b/l LE. Lymphedema present left lower extremity.  Neurologic Normal speech. Oriented to person, place, and time. Protective sensation intact 5/5 intact bilaterally with 10g monofilament b/l. Vibratory sensation intact b/l.  Dermatologic Pedal integument with normal turgor, texture and tone BLE. No open wounds b/l LE. No interdigital macerations noted b/l LE. Toenails 1-5 b/l elongated, discolored, dystrophic, thickened, crumbly with subungual debris and tenderness to dorsal palpation.   Porokeratotic lesion(s) plantar right great toe, plantarlateral midfoot  x 2 and submet head 2 right foot. No erythema, no edema, no drainage, no fluctuance.  Orthopedic: Normal muscle strength 5/5 to all lower extremity muscle groups bilaterally. Hammertoe deformity noted 2-5 b/l.Marland Kitchen No pain, crepitus or joint limitation noted with ROM b/l LE.  Patient ambulates independently without assistive aids.   Radiographs: None  Assessment/Plan: 1. Pain due to onychomycosis of toenails of both feet   2. Porokeratosis   3. Right foot pain     -Patient was evaluated and treated. All patient's and/or POA's questions/concerns answered on today's visit. -Medicare ABN on file for paring of corn(s)/callus(es)/porokeratos(es). Copy in patient chart. -Continue supportive shoe gear daily. -Toenails 1-5 b/l were debrided in length and girth with sterile nail nippers and dremel without iatrogenic bleeding.  -Porokeratotic lesion(s) right great toe, submet head 2 right foot, and plantarlateral aspect of midfoot right foot x 2 pared and enucleated with sterile currette without incident. Total number of lesions debrided=4. -Patient/POA to call should there be question/concern in the interim.   Return in about 9 weeks (around 03/15/2023).  Marzetta Board, DPM

## 2023-01-14 ENCOUNTER — Encounter: Payer: Self-pay | Admitting: Pharmacist

## 2023-01-15 ENCOUNTER — Other Ambulatory Visit (INDEPENDENT_AMBULATORY_CARE_PROVIDER_SITE_OTHER): Payer: Medicare Other

## 2023-01-15 ENCOUNTER — Telehealth: Payer: Self-pay | Admitting: Family Medicine

## 2023-01-15 DIAGNOSIS — R7303 Prediabetes: Secondary | ICD-10-CM

## 2023-01-15 DIAGNOSIS — E559 Vitamin D deficiency, unspecified: Secondary | ICD-10-CM

## 2023-01-15 DIAGNOSIS — E7800 Pure hypercholesterolemia, unspecified: Secondary | ICD-10-CM

## 2023-01-15 LAB — LIPID PANEL
Cholesterol: 133 mg/dL (ref 0–200)
HDL: 42.2 mg/dL (ref >39.00)
LDL Cholesterol: 74 mg/dL (ref 0–99)
NonHDL: 90.91
Total CHOL/HDL Ratio: 3
Triglycerides: 83 mg/dL (ref 0.0–149.0)
VLDL: 16.6 mg/dL (ref 0.0–40.0)

## 2023-01-15 LAB — COMPREHENSIVE METABOLIC PANEL
ALT: 12 U/L (ref 0–35)
AST: 15 U/L (ref 0–37)
Albumin: 3.7 g/dL (ref 3.5–5.2)
Alkaline Phosphatase: 64 U/L (ref 39–117)
BUN: 18 mg/dL (ref 6–23)
CO2: 30 mEq/L (ref 19–32)
Calcium: 9.4 mg/dL (ref 8.4–10.5)
Chloride: 99 mEq/L (ref 96–112)
Creatinine, Ser: 0.82 mg/dL (ref 0.40–1.20)
GFR: 66.88 mL/min (ref >60.00)
Glucose, Bld: 78 mg/dL (ref 70–99)
Potassium: 3.8 mEq/L (ref 3.5–5.1)
Sodium: 137 mEq/L (ref 135–145)
Total Bilirubin: 0.4 mg/dL (ref 0.2–1.2)
Total Protein: 8.1 g/dL (ref 6.0–8.3)

## 2023-01-15 LAB — HEMOGLOBIN A1C: Hgb A1c MFr Bld: 5.9 % (ref 4.6–6.5)

## 2023-01-15 LAB — VITAMIN D 25 HYDROXY (VIT D DEFICIENCY, FRACTURES): VITD: 57.77 ng/mL (ref 30.00–100.00)

## 2023-01-15 NOTE — Progress Notes (Signed)
No critical labs need to be addressed urgently. We will discuss labs in detail at upcoming office visit.   

## 2023-01-15 NOTE — Telephone Encounter (Signed)
-----   Message from Velna Hatchet, RT sent at 12/31/2022 10:13 AM EST ----- Regarding: Tue 2/20 lab Patient is scheduled for cpx, please order future labs.  Thanks, Anda Kraft

## 2023-01-21 ENCOUNTER — Ambulatory Visit (INDEPENDENT_AMBULATORY_CARE_PROVIDER_SITE_OTHER): Payer: Medicare Other

## 2023-01-21 VITALS — Ht 60.0 in | Wt 169.0 lb

## 2023-01-21 DIAGNOSIS — Z1231 Encounter for screening mammogram for malignant neoplasm of breast: Secondary | ICD-10-CM | POA: Diagnosis not present

## 2023-01-21 DIAGNOSIS — Z780 Asymptomatic menopausal state: Secondary | ICD-10-CM | POA: Diagnosis not present

## 2023-01-21 DIAGNOSIS — Z0000 Encounter for general adult medical examination without abnormal findings: Secondary | ICD-10-CM

## 2023-01-21 NOTE — Patient Instructions (Signed)
[name] , Thank you for taking time to come for your Medicare Wellness Visit. I appreciate your ongoing commitment to your health goals. Please review the following plan we discussed and let me know if I can assist you in the future.   These are the goals we discussed:  Goals      Patient Stated     Starting 11/27/2018, I will continue to take medications as prescribed.      Patient Stated     12/03/2019, I will try to work on my mental health and have a more positive attitude.      Patient Stated     Patient Stated     Go back to the gym.        This is a list of the screening recommended for you and due dates:  Health Maintenance  Topic Date Due   DTaP/Tdap/Td vaccine (2 - Tdap) 01/24/2017   COVID-19 Vaccine (5 - 2023-24 season) 07/27/2022   DEXA scan (bone density measurement)  12/30/2022   Mammogram  02/28/2023   Medicare Annual Wellness Visit  01/22/2024   Pneumonia Vaccine  Completed   Flu Shot  Completed   Zoster (Shingles) Vaccine  Completed   HPV Vaccine  Aged Out    Advanced directives: Advance directive discussed with you today. Even though you declined this today, please call our office should you change your mind, and we can give you the proper paperwork for you to fill out.   Conditions/risks identified: Aim for 30 minutes of exercise or brisk walking, 6-8 glasses of water, and 5 servings of fruits and vegetables each day.   Next appointment: Follow up in one year for your annual wellness visit 01/27/2024 @ 1:00 via telephone.   Preventive Care 56 Years and Older, Female Preventive care refers to lifestyle choices and visits with your health care provider that can promote health and wellness. What does preventive care include? A yearly physical exam. This is also called an annual well check. Dental exams once or twice a year. Routine eye exams. Ask your health care provider how often you should have your eyes checked. Personal lifestyle choices,  including: Daily care of your teeth and gums. Regular physical activity. Eating a healthy diet. Avoiding tobacco and drug use. Limiting alcohol use. Practicing safe sex. Taking low-dose aspirin every day. Taking vitamin and mineral supplements as recommended by your health care provider. What happens during an annual well check? The services and screenings done by your health care provider during your annual well check will depend on your age, overall health, lifestyle risk factors, and family history of disease. Counseling  Your health care provider may ask you questions about your: Alcohol use. Tobacco use. Drug use. Emotional well-being. Home and relationship well-being. Sexual activity. Eating habits. History of falls. Memory and ability to understand (cognition). Work and work Statistician. Reproductive health. Screening  You may have the following tests or measurements: Height, weight, and BMI. Blood pressure. Lipid and cholesterol levels. These may be checked every 5 years, or more frequently if you are over 30 years old. Skin check. Lung cancer screening. You may have this screening every year starting at age 1 if you have a 30-pack-year history of smoking and currently smoke or have quit within the past 15 years. Fecal occult blood test (FOBT) of the stool. You may have this test every year starting at age 52. Flexible sigmoidoscopy or colonoscopy. You may have a sigmoidoscopy every 5 years or a colonoscopy  every 10 years starting at age 65. Hepatitis C blood test. Hepatitis B blood test. Sexually transmitted disease (STD) testing. Diabetes screening. This is done by checking your blood sugar (glucose) after you have not eaten for a while (fasting). You may have this done every 1-3 years. Bone density scan. This is done to screen for osteoporosis. You may have this done starting at age 66. Mammogram. This may be done every 1-2 years. Talk to your health care provider  about how often you should have regular mammograms. Talk with your health care provider about your test results, treatment options, and if necessary, the need for more tests. Vaccines  Your health care provider may recommend certain vaccines, such as: Influenza vaccine. This is recommended every year. Tetanus, diphtheria, and acellular pertussis (Tdap, Td) vaccine. You may need a Td booster every 10 years. Zoster vaccine. You may need this after age 69. Pneumococcal 13-valent conjugate (PCV13) vaccine. One dose is recommended after age 85. Pneumococcal polysaccharide (PPSV23) vaccine. One dose is recommended after age 28. Talk to your health care provider about which screenings and vaccines you need and how often you need them. This information is not intended to replace advice given to you by your health care provider. Make sure you discuss any questions you have with your health care provider. Document Released: 12/09/2015 Document Revised: 08/01/2016 Document Reviewed: 09/13/2015 Elsevier Interactive Patient Education  2017 [employer] Prevention in the Home Falls can cause injuries. They can happen to people of all ages. There are many things you can do to make your home safe and to help prevent falls. What can I do on the outside of my home? Regularly fix the edges of walkways and driveways and fix any cracks. Remove anything that might make you trip as you walk through a door, such as a raised step or threshold. Trim any bushes or trees on the path to your home. Use bright outdoor lighting. Clear any walking paths of anything that might make someone trip, such as rocks or tools. Regularly check to see if handrails are loose or broken. Make sure that both sides of any steps have handrails. Any raised decks and porches should have guardrails on the edges. Have any leaves, snow, or ice cleared regularly. Use sand or salt on walking paths during winter. Clean up any spills in  your garage right away. This includes oil or grease spills. What can I do in the bathroom? Use night lights. Install grab bars by the toilet and in the tub and shower. Do not use towel bars as grab bars. Use non-skid mats or decals in the tub or shower. If you need to sit down in the shower, use a plastic, non-slip stool. Keep the floor dry. Clean up any water that spills on the floor as soon as it happens. Remove soap buildup in the tub or shower regularly. Attach bath mats securely with double-sided non-slip rug tape. Do not have throw rugs and other things on the floor that can make you trip. What can I do in the bedroom? Use night lights. Make sure that you have a light by your bed that is easy to reach. Do not use any sheets or blankets that are too big for your bed. They should not hang down onto the floor. Have a firm chair that has side arms. You can use this for support while you get dressed. Do not have throw rugs and other things on the floor that can make  you trip. What can I do in the kitchen? Clean up any spills right away. Avoid walking on wet floors. Keep items that you use a lot in easy-to-reach places. If you need to reach something above you, use a strong step stool that has a grab bar. Keep electrical cords out of the way. Do not use floor polish or wax that makes floors slippery. If you must use wax, use non-skid floor wax. Do not have throw rugs and other things on the floor that can make you trip. What can I do with my stairs? Do not leave any items on the stairs. Make sure that there are handrails on both sides of the stairs and use them. Fix handrails that are broken or loose. Make sure that handrails are as long as the stairways. Check any carpeting to make sure that it is firmly attached to the stairs. Fix any carpet that is loose or worn. Avoid having throw rugs at the top or bottom of the stairs. If you do have throw rugs, attach them to the floor with carpet  tape. Make sure that you have a light switch at the top of the stairs and the bottom of the stairs. If you do not have them, ask someone to add them for you. What else can I do to help prevent falls? Wear shoes that: Do not have high heels. Have rubber bottoms. Are comfortable and fit you well. Are closed at the toe. Do not wear sandals. If you use a stepladder: Make sure that it is fully opened. Do not climb a closed stepladder. Make sure that both sides of the stepladder are locked into place. Ask someone to hold it for you, if possible. Clearly mark and make sure that you can see: Any grab bars or handrails. First and last steps. Where the edge of each step is. Use tools that help you move around (mobility aids) if they are needed. These include: Canes. Walkers. Scooters. Crutches. Turn on the lights when you go into a dark area. Replace any light bulbs as soon as they burn out. Set up your furniture so you have a clear path. Avoid moving your furniture around. If any of your floors are uneven, fix them. If there are any pets around you, be aware of where they are. Review your medicines with your doctor. Some medicines can make you feel dizzy. This can increase your chance of falling. Ask your doctor what other things that you can do to help prevent falls. This information is not intended to replace advice given to you by your health care provider. Make sure you discuss any questions you have with your health care provider. Document Released: 09/08/2009 Document Revised: 04/19/2016 Document Reviewed: 12/17/2014 Elsevier Interactive Patient Education  2017 [employer].

## 2023-01-21 NOTE — Progress Notes (Signed)
I connected with  [name] on 01/21/23 by a audio enabled telemedicine application and verified that I am speaking with the correct person using two identifiers.  Patient Location: Home  Provider Location: Office/Clinic  I discussed the limitations of evaluation and management by telemedicine. The patient expressed understanding and agreed to proceed.  Subjective:   Tracy Dixon is a 82 y.o. female who presents for Medicare Annual (Subsequent) preventive examination.  Review of Systems     Cardiac Risk Factors include: advanced age (>56mn, >>9women);hypertension;sedentary lifestyle     Objective:    Today's Vitals   01/21/23 1418  Weight: 169 lb (76.7 kg)  Height: 5' (1.524 m)   Body mass index is 33.01 kg/m.     01/21/2023    2:37 PM 09/26/2021    6:00 PM 12/03/2019    9:06 AM 11/27/2018   12:42 PM 06/04/2018   12:01 PM 05/27/2018    2:21 PM 11/12/2017   10:12 AM  Advanced Directives  Does Patient Have a Medical Advance Directive? No No No No No No No  Would patient like information on creating a medical advance directive? No - Patient declined  No - Patient declined No - Patient declined No - Patient declined No - Patient declined Yes (MAU/Ambulatory/Procedural Areas - Information given)    Current Medications (verified) Outpatient Encounter Medications as of 01/21/2023  Medication Sig   amLODipine (NORVASC) 5 MG tablet TAKE 1 TABLET BY MOUTH DAILY   atorvastatin (LIPITOR) 20 MG tablet TAKE 1 TABLET BY MOUTH ONCE  DAILY   cholecalciferol (VITAMIN D) 1000 UNITS tablet Take 1,000 Units by mouth daily.   hydrochlorothiazide (MICROZIDE) 12.5 MG capsule TAKE 1 CAPSULE BY MOUTH DAILY   ibuprofen (ADVIL) 200 MG tablet Take 200 mg by mouth every 6 (six) hours as needed for mild pain.   omeprazole (PRILOSEC OTC) 20 MG tablet Take 20 mg by mouth daily.   Probiotic Product (PROBIOTIC BLEND PO) Take 50 capsules by mouth daily.   albuterol (VENTOLIN HFA) 108 (90 Base)  MCG/ACT inhaler albuterol sulfate HFA 90 mcg/actuation aerosol inhaler  INHALE 1 TO 2 PUFFS BY MOUTH EVERY 6 HOURS AS NEEDED FOR SHORTNESS OF BREATH (FOR USE WHEN IN CONTACT WITH DOGS OR CATS) (Patient not taking: Reported on 01/21/2023)   cetirizine (ZYRTEC) 10 MG tablet Take 10 mg by mouth daily as needed for allergies. Reported on 01/10/2016 (Patient not taking: Reported on 01/21/2023)   No facility-administered encounter medications on file as of 01/21/2023.    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[facility]    [place] by [facility]  Allergy    Arthritis    Asthma    Complication of anesthesia    stays sleepy   Diverticulosis of colon    Gallstones    Hyperlipidemia    Hypertension    Osteoarthritis    Symptomatic mammary hypertrophy    Past Surgical History:  Procedure Laterality Date   Bilateral tubal ligation  2005   BREAST REDUCTION SURGERY Bilateral 06/04/2018   Procedure: MAMMARY REDUCTION  (BREAST);  Surgeon: DWallace Going DO;  [place]  Service: Plastics;  Laterality: Bilateral;   Carotid doppler  07/2006   Neg   CESAREAN SECTION  2005   CHOLECYSTECTOMY  1982   DECOMPRESSIVE LUMBAR LAMINECTOMY LEVEL 3  06/10/2013   Procedure: DECOMPRESSIVE LUMBAR LAMINECTOMY LEVEL 3;  Surgeon: Melina Schools, MD;  Location: [place];  Service: Orthopedics;;  lumbar three to five decompression   MRI brain  2007   acoustic neuroma (check yearly with MRI)   PFT's  12/2006   (-) FEV1/FVC 79%, FEV1 84%   REDUCTION MAMMAPLASTY Bilateral 05/2018   REPLACEMENT TOTAL KNEE BILATERAL  2005   TONSILLECTOMY  2005   TRIGGER FINGER RELEASE Left 09/05/2022   TUBAL LIGATION     Family History  Problem Relation Age of Onset   Cancer Mother        Lung CA   Stroke Mother        CVA   Cancer  Father        Prostate   Heart disease Neg Hx    Breast cancer Neg Hx    Social History   Socioeconomic History   Marital status: Widowed    Spouse name: Not on file   Number of children: 4   Years of education: Not on file   Highest education level: Not on file  Occupational History   Occupation: Retired - Printmaker - Custodial Service    Employer: RETIRED  Tobacco Use   Smoking status: Former    Years: 15.00    Types: Cigarettes    Quit date: 06/02/1973    Years since quitting: 49.6   Smokeless tobacco: Never   Tobacco comments:    few cigs/day  Vaping Use   Vaping Use: Never used  Substance and Sexual Activity   Alcohol use: No   Drug use: No   Sexual activity: Not on file  Other Topics Concern   Not on file  Social History Narrative   Regular exercise:  No   Diet:  Fruits and veggies, rare FF, no water    No living will, no HCPOA.   Social Determinants of Health   Financial Resource Strain: Low Risk  (01/21/2023)   Overall Financial Resource Strain (CARDIA)    Difficulty of Paying Living Expenses: Not hard at all  Food Insecurity: No Food Insecurity (01/21/2023)   Hunger Vital Sign    Worried About Running Out of Food in the Last Year: Never true    Ran Out of Food in the Last Year: Never true  Transportation Needs: No Transportation Needs (01/21/2023)   PRAPARE - Hydrologist (Medical): No    Lack of Transportation (Non-Medical): No  Physical Activity: Inactive (01/21/2023)   Exercise Vital Sign    Days of Exercise per Week: 0 days    Minutes of Exercise per Session: 0 min  Stress: No Stress Concern Present (01/21/2023)   [place]    Feeling of Stress : Only a little  Social Connections: Socially Isolated (01/21/2023)   Social Connection and Isolation Panel [NHANES]    Frequency of Communication with Friends and Family: More than three times a week    Frequency of  Social Gatherings with Friends and Family: More than three times a week    Attends Religious Services: Never    Marine scientist or Organizations: No    Attends Archivist Meetings: Never    Marital Status: Widowed    Tobacco Counseling Counseling given: Not Answered Tobacco comments: few cigs/day   Clinical Intake:  Pre-visit preparation completed: Yes  Pain : No/denies pain     Nutritional Risks: Nausea/ vomitting/ diarrhea (Frequent Loose stools)  Diabetes: No  How often do you need to have someone help you when you read instructions, pamphlets, or other written materials from your doctor or pharmacy?: 1 - Never  Diabetic? no  Interpreter Needed?: No  Information entered by :: C.Pietra Zuluaga LPN   Activities of Daily Living    01/21/2023    2:41 PM  In your present state of health, do you have any difficulty performing the following activities:  Hearing? 0  Vision? 0  Difficulty concentrating or making decisions? 0  Walking or climbing stairs? 0  Dressing or bathing? 0  Doing errands, shopping? 0  Preparing Food and eating ? N  Using the Toilet? N  In the past six months, have you accidently leaked urine? N  Do you have problems with loss of bowel control? N  Managing your Medications? N  Managing your Finances? N  Housekeeping or managing your Housekeeping? N    Patient Care Team: [name], MD as PCP - General Sanjuana Kava, MD as Referring Physician (Otolaryngology) Melina Schools, MD as Consulting Physician (Orthopedic Surgery) Roseanne Kaufman, MD as Consulting Physician (Orthopedic Surgery) Paralee Cancel, MD as Consulting Physician (Orthopedic Surgery) Yetter, Loel Lofty, DO as Attending Physician (Plastic Surgery)  Indicate any recent Medical Services you may have received from other than [facility] providers in the past year (date may be approximate).     Assessment:   This is a routine wellness examination for  Deslynn.  Hearing/Vision screen Hearing Screening - Comments:: No aids Vision Screening - Comments:: Glasses - [facility]  Dietary issues and exercise activities discussed: Current Exercise Habits: The patient does not participate in regular exercise at present, Exercise limited by: None identified   Goals Addressed             This Visit's Progress    Patient Stated       Patient Stated       Go back to the gym.       Depression Screen    01/21/2023    2:35 PM 01/19/2022    2:07 PM 12/29/2020   10:22 AM 12/13/2020    8:36 AM 12/03/2019    9:07 AM 11/27/2018   12:32 PM 11/12/2017   10:10 AM  PHQ 2/9 Scores  PHQ - 2 Score 0 1 0 0 0 0 0  PHQ- 9 Score     0 0 0    Fall Risk    01/21/2023    2:40 PM 01/19/2022    2:06 PM 12/29/2020   10:22 AM 12/03/2019    9:07 AM 11/27/2018   12:32 PM  Fall Risk   Falls in the past year? 0 0 0 0 1  Comment     single fall as result of tripping  Number falls in past yr: 0   0 0  Injury with Fall? 0   0 1  Risk for fall due to : No Fall Risks   Medication side effect   Follow up Falls prevention discussed;Falls evaluation completed   Falls evaluation completed;Falls prevention discussed     FALL RISK PREVENTION PERTAINING TO THE HOME:  Any stairs in or around the home? Yes  If so, are there any without handrails? No  Home free of loose throw rugs in walkways, pet beds, electrical cords, etc? Yes  Adequate lighting in your home to reduce risk of falls? Yes   ASSISTIVE DEVICES UTILIZED TO PREVENT FALLS:  Life alert? No  Use of a  cane, walker or w/c? Yes  Grab bars in the bathroom? Yes  Shower chair or bench in shower? Yes  Elevated toilet seat or a handicapped toilet? Yes    Cognitive Function:    12/03/2019    9:10 AM 11/27/2018   12:31 PM 11/12/2017   10:11 AM  MMSE - Mini Mental State Exam  Orientation to time '5 5 5  '$ Orientation to Place '5 5 5  '$ Registration '3 3 3  '$ Attention/ Calculation 5 0 0  Recall '3 3 3  '$ Language-  name 2 objects  0 0  Language- repeat '1 1 1  '$ Language- follow 3 step command  3 2  Language- follow 3 step command-comments   unable to follow 1 step of 3 step command  Language- read & follow direction  0 0  Write a sentence  0 0  Copy design  0 0  Total score  20 19        01/21/2023    2:41 PM  6CIT Screen  What Year? 0 points  What month? 0 points  What time? 0 points  Count back from 20 0 points  Months in reverse 0 points  Repeat phrase 0 points  Total Score 0 points    Immunizations Immunization History  Administered Date(s) Administered   Fluad Quad(high Dose 65+) 07/30/2019, 09/03/2020, 07/25/2021, 08/21/2022   H1N1 01/12/2009   Influenza Split 08/28/2011, 08/28/2012   Influenza Whole 09/19/2007, 08/23/2008, 09/07/2009, 08/30/2010   Influenza,inj,Quad PF,6+ Mos 08/04/2013, 08/24/2014, 09/02/2015, 08/28/2016, 09/03/2017, 09/04/2018   Influenza,inj,quad, With Preservative 08/26/2019   Influenza-Unspecified 09/02/2017   PFIZER(Purple Top)SARS-COV-2 Vaccination 12/05/2019, 12/26/2019, 07/18/2020, 03/20/2021   Pneumococcal Conjugate-13 09/30/2014   Pneumococcal Polysaccharide-23 09/21/2008   Td 01/25/2007   Zoster Recombinat (Shingrix) 08/10/2019, 06/26/2020   Zoster, Live 08/20/2013    TDAP status: Due, Education has been provided regarding the importance of this vaccine. Advised may receive this vaccine at local pharmacy or Health Dept. Aware to provide a copy of the vaccination record if obtained from local pharmacy or Health Dept. Verbalized acceptance and understanding.  Flu Vaccine status: Up to date  Pneumococcal vaccine status: Up to date  Covid-19 vaccine status: Completed vaccines At [facility] on [facility] [address] [facility].  Qualifies for Shingles Vaccine? No   Zostavax completed No   Shingrix Completed?: Yes  Screening Tests Health Maintenance  Topic Date Due   DTaP/Tdap/Td (2 - Tdap) 01/24/2017   COVID-19 Vaccine (5 - 2023-24 season) 07/27/2022   DEXA SCAN   12/30/2022   MAMMOGRAM  02/28/2023   Medicare Annual Wellness (AWV)  01/22/2024   Pneumonia Vaccine 72+ Years old  Completed   INFLUENZA VACCINE  Completed   Zoster Vaccines- Shingrix  Completed   HPV VACCINES  Aged Out    Health Maintenance  Health Maintenance Due  Topic Date Due   DTaP/Tdap/Td (2 - Tdap) 01/24/2017   COVID-19 Vaccine (5 - 2023-24 season) 07/27/2022   DEXA SCAN  12/30/2022    Colorectal cancer screening: No longer required.   Mammogram status: Completed 02/27/2022 . Repeat every year  Bone Density status: Completed 12/30/2017. Results reflect: Bone density results: OSTEOPENIA. Repeat every 2 years.  Lung Cancer Screening: (Low Dose CT Chest recommended if Age 64-80 years, 30 pack-year currently smoking OR have quit w/in 15years.) does not qualify.   Lung Cancer Screening Referral: no  Additional Screening:  Hepatitis C Screening: does not qualify; Completed no  Vision Screening: Recommended annual ophthalmology exams for early detection of glaucoma and other disorders of  the eye. Is the patient up to date with their annual eye exam?  Yes  Who is the provider or what is the name of the office in which the patient attends annual eye exams? [facility] If pt is not established with a provider, would they like to be referred to a provider to establish care? No .   Dental Screening: Recommended annual dental exams for proper oral hygiene  Community Resource Referral / Chronic Care Management: CRR required this visit?  No   CCM required this visit?  No      Plan:     I have personally reviewed and noted the following in the patient's chart:   Medical and social history Use of alcohol, tobacco or illicit drugs  Current medications and supplements including opioid prescriptions. Patient is not currently taking opioid prescriptions. Functional ability and status Nutritional status Physical activity Advanced directives List of other  physicians Hospitalizations, surgeries, and ER visits in previous 12 months Vitals Screenings to include cognitive, depression, and falls Referrals and appointments  In addition, I have reviewed and discussed with patient certain preventive protocols, quality metrics, and best practice recommendations. A written personalized care plan for preventive services as well as general preventive health recommendations were provided to patient.     Lebron Conners, LPN   [id number]   Nurse Notes: Pt requested to continue mammogram and bone scan. Orders placed.

## 2023-01-22 ENCOUNTER — Encounter: Payer: Self-pay | Admitting: Family Medicine

## 2023-01-22 ENCOUNTER — Ambulatory Visit (INDEPENDENT_AMBULATORY_CARE_PROVIDER_SITE_OTHER): Payer: Medicare Other | Admitting: Family Medicine

## 2023-01-22 VITALS — BP 112/72 | HR 92 | Temp 97.7°F | Ht 60.5 in | Wt 172.4 lb

## 2023-01-22 DIAGNOSIS — R7303 Prediabetes: Secondary | ICD-10-CM

## 2023-01-22 DIAGNOSIS — E7800 Pure hypercholesterolemia, unspecified: Secondary | ICD-10-CM

## 2023-01-22 DIAGNOSIS — K529 Noninfective gastroenteritis and colitis, unspecified: Secondary | ICD-10-CM | POA: Diagnosis not present

## 2023-01-22 DIAGNOSIS — I10 Essential (primary) hypertension: Secondary | ICD-10-CM

## 2023-01-22 DIAGNOSIS — E559 Vitamin D deficiency, unspecified: Secondary | ICD-10-CM | POA: Diagnosis not present

## 2023-01-22 NOTE — Assessment & Plan Note (Addendum)
She has been treated several times for E. coli infection causing a flare of diarrhea.  She now reports she is at a baseline of softer stools many times each morning.  She states after 11 in the morning and symptoms resolve. She has not followed up with Dr. Marius Ditch or had a colonoscopy.  Past workup has been unremarkable.  Discussed with patient that this could be irritable bowel syndrome but that is a diagnosis of exclusion.  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  She will look into the FODMAP diet to see if avoiding high FODMAP items improves her symptoms.  We discussed the pros and cons of using a medication like colestyramine for diarrhea.  If she does not have a recurrence of severe symptoms we could consider this as an option. If she has recurrence of the severe watery stools she will return for further evaluation with Dr. Marius Ditch.

## 2023-01-22 NOTE — Patient Instructions (Addendum)
Increase water, fiber, ( start Benefiber, metamucil) continue probiotic like Align.  Limit caffeine.  Look into FODMAP diet.  If not improving consider following up with GI.

## 2023-01-22 NOTE — Progress Notes (Signed)
Patient ID: Tracy Dixon, female    DOB: 1940/12/14, 82 y.o.   MRN: [id number]  This visit was conducted in person.  BP 112/72   Pulse 92   Temp 97.7 F (36.5 C) (Temporal)   Ht 5' 0.5" (1.537 m)   Wt 172 lb 6 oz (78.2 kg)   SpO2 99%   BMI 33.11 kg/m    CC: Chief Complaint  Patient presents with   Annual Exam    Part 2    Subjective:   HPI: Tracy Dixon is a 82 y.o. female presenting on 01/22/2023 for Annual Exam (Part 2)  The patient presents for review of chronic health problems. He/She also has the following acute concerns today: none  The patient saw a LPN or RN for medicare wellness visit.  Prevention and wellness was reviewed in detail. Note reviewed and important notes copied below.    Advance directives and end of life planning reviewed in detail with patient and documented in EMR. Patient given handout on advance care directives if needed. HCPOA and living will updated if needed.    No falls in last year.  He husband passed away in the last year.  Family is supportive of her .. she speaks to kids daily. Flowsheet Row Clinical Support from 01/21/2023 in [facility]  PHQ-2 Total Score 0       Hypertension:   Good control on amlodipine   BP Readings from Last 3 Encounters:  01/22/23 112/72  01/11/23 (!) 159/71  11/03/22 (!) 140/79  Using medication without problems or lightheadedness:  Chest pain with exertion: none Edema:yes due to chronic venous insufficiency.. using compression hose Short of breath: none Average home BPs: Other issues:  She is trying to to get  back to the [facility]..bicycling. Wt Readings from Last 3 Encounters:  01/22/23 172 lb 6 oz (78.2 kg)  01/21/23 169 lb (76.7 kg)  10/25/22 176 lb 4 oz (79.9 kg)   Body mass index is 33.11 kg/m.   Dr Marius Ditch: GI  chronic diarrhea.. this continues to cause her a lot of problems.  Has frequent soft stool.    Stool in past showed EColi several times..  treated with antibiotic several times.  Had negative H pylori, pancreatic testing, celiac panel.  Elevated Cholesterol: Well-controlled with LDL at goal on atorvastatin 20 mg daily. Lab Results  Component Value Date   CHOL 133 01/15/2023   HDL 42.20 01/15/2023   LDLCALC 74 01/15/2023   TRIG 83.0 01/15/2023   CHOLHDL 3 01/15/2023  Using medications without problems: none Muscle aches:  none Diet compliance:  moderate Exercise: minimal Other complaints:   Wt Readings from Last 3 Encounters:  01/22/23 172 lb 6 oz (78.2 kg)  01/21/23 169 lb (76.7 kg)  10/25/22 176 lb 4 oz (79.9 kg)    Prediabetes  Lab Results  Component Value Date   HGBA1C 5.9 01/15/2023   Vitamin D deficiency: Resolved on supplementation.    Followed by Dr. Nelva Bush.. s/p ESI.. had complication. Does not want to repeats.  Relevant past medical, surgical, family and social history reviewed and updated as indicated. Interim medical history since our last visit reviewed. Allergies and medications reviewed and updated. Outpatient Medications Prior to Visit  Medication Sig Dispense Refill   amLODipine (NORVASC) 5 MG tablet TAKE 1 TABLET BY MOUTH DAILY 90 tablet 3   atorvastatin (LIPITOR) 20 MG tablet TAKE 1 TABLET BY MOUTH ONCE  DAILY 90 tablet 3  cetirizine (ZYRTEC) 10 MG tablet Take 10 mg by mouth daily as needed for allergies. Reported on 01/10/2016     cholecalciferol (VITAMIN D) 1000 UNITS tablet Take 1,000 Units by mouth daily.     hydrochlorothiazide (MICROZIDE) 12.5 MG capsule TAKE 1 CAPSULE BY MOUTH DAILY 90 capsule 0   ibuprofen (ADVIL) 200 MG tablet Take 200 mg by mouth every 6 (six) hours as needed for mild pain.     omeprazole (PRILOSEC OTC) 20 MG tablet Take 20 mg by mouth daily.     Probiotic Product (PROBIOTIC BLEND PO) Take 1 capsule by mouth daily.     albuterol (VENTOLIN HFA) 108 (90 Base) MCG/ACT inhaler albuterol sulfate HFA 90 mcg/actuation aerosol inhaler  INHALE 1 TO 2 PUFFS BY MOUTH EVERY  6 HOURS AS NEEDED FOR SHORTNESS OF BREATH (FOR USE WHEN IN CONTACT WITH DOGS OR CATS) (Patient not taking: Reported on 01/21/2023)     No facility-administered medications prior to visit.     Per HPI unless specifically indicated in ROS section below Review of Systems  Constitutional:  Negative for fatigue and fever.  HENT:  Negative for congestion.   Eyes:  Negative for pain.  Respiratory:  Negative for cough and shortness of breath.   Cardiovascular:  Negative for chest pain, palpitations and leg swelling.  Gastrointestinal:  Negative for abdominal pain.  Genitourinary:  Negative for dysuria and vaginal bleeding.  Musculoskeletal:  Negative for back pain.  Neurological:  Negative for syncope, light-headedness and headaches.  Psychiatric/Behavioral:  Negative for dysphoric mood.    Objective:  BP 112/72   Pulse 92   Temp 97.7 F (36.5 C) (Temporal)   Ht 5' 0.5" (1.537 m)   Wt 172 lb 6 oz (78.2 kg)   SpO2 99%   BMI 33.11 kg/m   Wt Readings from Last 3 Encounters:  01/22/23 172 lb 6 oz (78.2 kg)  01/21/23 169 lb (76.7 kg)  10/25/22 176 lb 4 oz (79.9 kg)      Physical Exam Constitutional:      General: She is not in acute distress.    Appearance: Normal appearance. She is well-developed. She is not ill-appearing or toxic-appearing.  HENT:     Head: Normocephalic.     Right Ear: Hearing, tympanic membrane, ear canal and external ear normal. Tympanic membrane is not erythematous, retracted or bulging.     Left Ear: Hearing, tympanic membrane, ear canal and external ear normal. Tympanic membrane is not erythematous, retracted or bulging.     Nose: No mucosal edema or rhinorrhea.     Right Sinus: No maxillary sinus tenderness or frontal sinus tenderness.     Left Sinus: No maxillary sinus tenderness or frontal sinus tenderness.     Mouth/Throat:     Pharynx: Uvula midline.  Eyes:     General: Lids are normal. Lids are everted, no foreign bodies appreciated.      Conjunctiva/sclera: Conjunctivae normal.     Pupils: Pupils are equal, round, and reactive to light.  Neck:     Thyroid: No thyroid mass or thyromegaly.     Vascular: No carotid bruit.     Trachea: Trachea normal.  Cardiovascular:     Rate and Rhythm: Normal rate and regular rhythm.     Pulses: Normal pulses.     Heart sounds: Normal heart sounds, S1 normal and S2 normal. No murmur heard.    No friction rub. No gallop.  Pulmonary:     Effort: Pulmonary effort is normal. No  tachypnea or respiratory distress.     Breath sounds: Normal breath sounds. No decreased breath sounds, wheezing, rhonchi or rales.  Abdominal:     General: Bowel sounds are normal.     Palpations: Abdomen is soft.     Tenderness: There is no abdominal tenderness.  Musculoskeletal:     Cervical back: Normal range of motion and neck supple.  Skin:    General: Skin is warm and dry.     Findings: No rash.  Neurological:     Mental Status: She is alert.  Psychiatric:        Mood and Affect: Mood is not anxious or depressed.        Speech: Speech normal.        Behavior: Behavior normal. Behavior is cooperative.        Thought Content: Thought content normal.        Judgment: Judgment normal.       Results for orders placed or performed in visit on 01/15/23  VITAMIN D 25 Hydroxy (Vit-D Deficiency, Fractures)  Result Value Ref Range   VITD 57.77 30.00 - 100.00 ng/mL  Comprehensive metabolic panel  Result Value Ref Range   Sodium 137 135 - 145 mEq/L   Potassium 3.8 3.5 - 5.1 mEq/L   Chloride 99 96 - 112 mEq/L   CO2 30 19 - 32 mEq/L   Glucose, Bld 78 70 - 99 mg/dL   BUN 18 6 - 23 mg/dL   Creatinine, Ser 0.82 0.40 - 1.20 mg/dL   Total Bilirubin 0.4 0.2 - 1.2 mg/dL   Alkaline Phosphatase 64 39 - 117 U/L   AST 15 0 - 37 U/L   ALT 12 0 - 35 U/L   Total Protein 8.1 6.0 - 8.3 g/dL   Albumin 3.7 3.5 - 5.2 g/dL   GFR 66.88 >60.00 mL/min   Calcium 9.4 8.4 - 10.5 mg/dL  Hemoglobin A1c  Result Value Ref Range    Hgb A1c MFr Bld 5.9 4.6 - 6.5 %  Lipid panel  Result Value Ref Range   Cholesterol 133 0 - 200 mg/dL   Triglycerides 83.0 0.0 - 149.0 mg/dL   HDL 42.20 >39.00 mg/dL   VLDL 16.6 0.0 - 40.0 mg/dL   LDL Cholesterol 74 0 - 99 mg/dL   Total CHOL/HDL Ratio 3    NonHDL 90.91     This visit occurred during the SARS-CoV-2 public health emergency.  Safety protocols were in place, including screening questions prior to the visit, additional usage of staff PPE, and extensive cleaning of exam room while observing appropriate contact time as indicated for disinfecting solutions.   COVID 19 screen:  No recent travel or known exposure to COVID19 The patient denies respiratory symptoms of COVID 19 at this time. The importance of social distancing was discussed today.   Assessment and Plan   The patient's preventative maintenance and recommended screening tests for an annual wellness exam were reviewed in full today. Brought up to date unless services declined.  Counselled on the importance of diet, exercise, and its role in overall health and mortality. The patient's FH and SH was reviewed, including their home life, tobacco status, and drug and alcohol status.   Vaccines: uptodate, refused tdap. Has gotten COVID19 x 4, Shingrix, RSV. Pap/DVE:  No pap/no DVE indicated, No family history of uterine or ovarian cancer. No vaginal bleeding, no discharge. Mammo:   02/2022.. 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   Problem List Items Addressed This Visit     Chronic diarrhea    She has been treated several times for E. coli infection causing a flare of diarrhea.  She now reports she is at a baseline of softer stools many times each morning.  She states after 11 in the morning and symptoms resolve. She  has not followed up with Dr. Marius Ditch or had a colonoscopy.  Past workup has been unremarkable.  Discussed with patient that this could be irritable bowel syndrome but that is a diagnosis of exclusion.  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  She will look into the FODMAP diet to see if avoiding high FODMAP items improves her symptoms.  We discussed the pros and cons of using a medication like colestyramine for diarrhea.  If she does not have a recurrence of severe symptoms we could consider this as an option. If she has recurrence of the severe watery stools she will return for further evaluation with Dr. Marius Ditch.       Essential hypertension, benign - Primary (Chronic)    Stable, chronic.  Continue current medication.   Amlodipine 5 mg daily  HCTZ 25 mg daily      High cholesterol (Chronic)    Stable, chronic.  Continue current medication.   Atorvastatin 20 mg daily      Prediabetes (Chronic)    Stable, chronic.   Good control with diet.         Vitamin D deficiency (Chronic)    Stable, chronic.  Continue current medication.          Eliezer Lofts, MD

## 2023-01-22 NOTE — Assessment & Plan Note (Signed)
Stable, chronic.  Continue current medication.   Amlodipine 5 mg daily  HCTZ 25 mg daily

## 2023-01-22 NOTE — Assessment & Plan Note (Signed)
Stable, chronic.  Continue current medication.    

## 2023-01-22 NOTE — Assessment & Plan Note (Signed)
Stable, chronic.   Good control with diet.

## 2023-01-22 NOTE — Assessment & Plan Note (Signed)
Stable, chronic.  Continue current medication. ? ? ?Atorvastatin 20 mg daily ?

## 2023-01-29 ENCOUNTER — Other Ambulatory Visit: Payer: Self-pay | Admitting: Family Medicine

## 2023-02-14 ENCOUNTER — Other Ambulatory Visit: Payer: Self-pay | Admitting: Family Medicine

## 2023-02-21 ENCOUNTER — Other Ambulatory Visit: Payer: Self-pay | Admitting: Family Medicine

## 2023-02-25 NOTE — Telephone Encounter (Signed)
Last office visit 01/22/23 for CPE.  Refill is for HCTZ 12.5 mg which is also what is listed on her medication list but in the office note at CPE it states:   Essential hypertension, benign - Primary (Chronic)       Stable, chronic.  Continue current medication.     Amlodipine 5 mg daily  HCTZ 25 mg daily      Please advise.

## 2023-03-15 ENCOUNTER — Encounter: Payer: Self-pay | Admitting: Podiatry

## 2023-03-15 ENCOUNTER — Ambulatory Visit (INDEPENDENT_AMBULATORY_CARE_PROVIDER_SITE_OTHER): Payer: Medicare Other | Admitting: Podiatry

## 2023-03-15 DIAGNOSIS — M79671 Pain in right foot: Secondary | ICD-10-CM

## 2023-03-15 DIAGNOSIS — M79674 Pain in right toe(s): Secondary | ICD-10-CM | POA: Diagnosis not present

## 2023-03-15 DIAGNOSIS — Q828 Other specified congenital malformations of skin: Secondary | ICD-10-CM

## 2023-03-15 DIAGNOSIS — M79675 Pain in left toe(s): Secondary | ICD-10-CM | POA: Diagnosis not present

## 2023-03-15 DIAGNOSIS — M79672 Pain in left foot: Secondary | ICD-10-CM

## 2023-03-15 DIAGNOSIS — B351 Tinea unguium: Secondary | ICD-10-CM | POA: Diagnosis not present

## 2023-03-15 NOTE — Progress Notes (Unsigned)
  Subjective:  Patient ID: Tracy Dixon, female    DOB: 16-Nov-1941,  MRN: [id number]  Tracy Dixon presents to clinic today for {jgcomplaint:23593}  Chief Complaint  Patient presents with   Nail Problem    [facility] PCP-[facility] PCP VST-12/2022   New problem(s): None. {jgcomplaint:23593}  PCP is Ermalene Searing, Amy E, MD.  Allergies  Allergen Reactions   Contrast Media [Iodinated Contrast Media] Hives   Crab Extract     Other reaction(s): Not available   Crab [Shellfish Allergy] Hives    Review of Systems: Negative except as noted in the HPI.  Objective: No changes noted in today's physical examination. There were no vitals filed for this visit. Tracy Dixon is a pleasant 82 y.o. female {jgbodyhabitus:24098} AAO x 3.  Vascular Capillary refill time to digits immediate b/l. Palpable pedal pulses b/l LE. Pedal hair sparse. No pain with calf compression b/l. Lower extremity skin temperature gradient within normal limits. No edema noted b/l LE. Lymphedema present left lower extremity.  Neurologic Normal speech. Oriented to person, place, and time. Protective sensation intact 5/5 intact bilaterally with 10g monofilament b/l. Vibratory sensation intact b/l.  Dermatologic Pedal integument with normal turgor, texture and tone BLE. No open wounds b/l LE. No interdigital macerations noted b/l LE. Toenails 1-5 b/l elongated, discolored, dystrophic, thickened, crumbly with subungual debris and tenderness to dorsal palpation.   Porokeratotic lesion(s) plantar right great toe, plantarlateral midfoot x 2 and submet head 2 right foot. No erythema, no edema, no drainage, no fluctuance.  Orthopedic: Normal muscle strength 5/5 to all lower extremity muscle groups bilaterally. Hammertoe deformity noted 2-5 b/l.Marland Kitchen No pain, crepitus or joint limitation noted with ROM b/l LE.  Patient ambulates independently without assistive aids.   Radiographs: None Assessment/Plan: No diagnosis found.  No orders of the  defined types were placed in this encounter.   None {Jgplan:23602::"-Patient/POA to call should there be question/concern in the interim."}   No follow-ups on file.  Freddie Breech, DPM

## 2023-03-15 NOTE — Patient Instructions (Signed)
Dr. Edwena Felty  Gel Callus Cushion Oval (Reusable)  [email or web address]

## 2023-03-20 ENCOUNTER — Ambulatory Visit
Admission: RE | Admit: 2023-03-20 | Discharge: 2023-03-20 | Disposition: A | Discharge status: Home / Self Care | Payer: Medicare Other | Source: Ambulatory Visit | Attending: Family Medicine | Admitting: Family Medicine

## 2023-03-20 DIAGNOSIS — Z1231 Encounter for screening mammogram for malignant neoplasm of breast: Secondary | ICD-10-CM | POA: Diagnosis not present

## 2023-03-20 DIAGNOSIS — Z780 Asymptomatic menopausal state: Secondary | ICD-10-CM | POA: Diagnosis not present

## 2023-03-20 DIAGNOSIS — M85852 Other specified disorders of bone density and structure, left thigh: Secondary | ICD-10-CM | POA: Diagnosis not present

## 2023-04-05 ENCOUNTER — Ambulatory Visit: Payer: Medicare Other | Admitting: Podiatry

## 2023-04-10 ENCOUNTER — Encounter: Payer: Self-pay | Admitting: Family Medicine

## 2023-04-11 ENCOUNTER — Ambulatory Visit (INDEPENDENT_AMBULATORY_CARE_PROVIDER_SITE_OTHER): Payer: Medicare Other | Admitting: Family Medicine

## 2023-04-11 ENCOUNTER — Encounter: Payer: Self-pay | Admitting: Family Medicine

## 2023-04-11 VITALS — BP 160/86 | HR 105 | Temp 97.3°F | Ht 60.5 in | Wt 171.1 lb

## 2023-04-11 DIAGNOSIS — J069 Acute upper respiratory infection, unspecified: Secondary | ICD-10-CM | POA: Diagnosis not present

## 2023-04-11 DIAGNOSIS — K580 Irritable bowel syndrome with diarrhea: Secondary | ICD-10-CM | POA: Diagnosis not present

## 2023-04-11 DIAGNOSIS — K529 Noninfective gastroenteritis and colitis, unspecified: Secondary | ICD-10-CM

## 2023-04-11 MED ORDER — CHOLESTYRAMINE 4 G PO PACK: 4.0000 g | PACK | Freq: Every day | ORAL | 0 refills | Status: DC | PRN

## 2023-04-11 NOTE — Progress Notes (Signed)
Patient ID: Tracy Dixon, female    DOB: Jun 09, 1941, 82 y.o.   MRN: [id number]  This visit was conducted in person.  BP (!) 160/86   Pulse (!) 105   Temp (!) 97.3 F (36.3 C) (Temporal)   Ht 5' 0.5" (1.537 m)   Wt 171 lb 2 oz (77.6 kg)   SpO2 96%   BMI 32.87 kg/m    CC: Chief Complaint  Patient presents with   Cough    C/o cough and head/chest congestion and horseness. Sxs started 04/07/23. Neg home Covid test [x2]- 04/07/23, 04/10/23. Tried Mucinex, helpful. Also, wants to discuss IBS med.     Subjective:   HPI: Tracy Dixon is a 82 y.o. female presenting on 04/11/2023 for Cough (C/o cough and head/chest congestion and horseness. Sxs started 04/07/23. Neg home Covid test [x2]- 04/07/23, 04/10/23. Tried Mucinex, helpful. Also, wants to discuss IBS med. )    Date of onset: 04/07/23 Initial symptoms included  heavy post nasal drip Symptoms progressed to productive cough   No fever, no SOB. No wheeze.  No ear pain, initial ST, now resolved Starting today to feel better.  Sick contacts: none, went out for Mother's Day COVID testing:   neg x 2 , most recent day 3 of illness    She has tried to treat with  zyrtec, mucinex DM    No history of chronic lung disease such as asthma or COPD. Non-smoker.  Dr Allegra Lai: GI  chronic diarrhea..    now only in AM.. soft stool 3-4 times... occur before she eat and resolved after.    Stool in past showed EColi several times.. treated with antibiotic several times.  Had negative H pylori, pancreatic testing, celiac panel.  She states eating fiber , drinking water and probiotic has not helped much. At last OV recommended trial of FODMAP diet. Cholestyramine  vs antispasmodic discussed in past as an option for her baseline diarrhea...does not current;ly seem to be severe like an infection.  Wt Readings from Last 3 Encounters:  04/11/23 171 lb 2 oz (77.6 kg)  01/22/23 172 lb 6 oz (78.2 kg)  01/21/23 169 lb (76.7 kg)     Relevant past  medical, surgical, family and social history reviewed and updated as indicated. Interim medical history since our last visit reviewed. Allergies and medications reviewed and updated. Outpatient Medications Prior to Visit  Medication Sig Dispense Refill   amLODipine (NORVASC) 5 MG tablet TAKE 1 TABLET BY MOUTH DAILY 90 tablet 3   atorvastatin (LIPITOR) 20 MG tablet TAKE 1 TABLET BY MOUTH ONCE  DAILY 90 tablet 3   cetirizine (ZYRTEC) 10 MG tablet Take 10 mg by mouth daily as needed for allergies. Reported on 01/10/2016     cholecalciferol (VITAMIN D) 1000 UNITS tablet Take 1,000 Units by mouth daily.     hydrochlorothiazide (MICROZIDE) 12.5 MG capsule TAKE 1 CAPSULE BY MOUTH DAILY 90 capsule 3   ibuprofen (ADVIL) 200 MG tablet Take 200 mg by mouth every 6 (six) hours as needed for mild pain.     omeprazole (PRILOSEC OTC) 20 MG tablet Take 20 mg by mouth daily.     Probiotic Product (PROBIOTIC BLEND PO) Take 1 capsule by mouth daily.     No facility-administered medications prior to visit.     Per HPI unless specifically indicated in ROS section below Review of Systems  Constitutional:  Negative for fatigue and fever.  HENT:  Negative for congestion.   Eyes:  Negative for pain.  Respiratory:  Negative for cough and shortness of breath.   Cardiovascular:  Negative for chest pain, palpitations and leg swelling.  Gastrointestinal:  Negative for abdominal pain.  Genitourinary:  Negative for dysuria and vaginal bleeding.  Musculoskeletal:  Negative for back pain.  Neurological:  Negative for syncope, light-headedness and headaches.  Psychiatric/Behavioral:  Negative for dysphoric mood.    Objective:  BP (!) 160/86   Pulse (!) 105   Temp (!) 97.3 F (36.3 C) (Temporal)   Ht 5' 0.5" (1.537 m)   Wt 171 lb 2 oz (77.6 kg)   SpO2 96%   BMI 32.87 kg/m   Wt Readings from Last 3 Encounters:  04/11/23 171 lb 2 oz (77.6 kg)  01/22/23 172 lb 6 oz (78.2 kg)  01/21/23 169 lb (76.7 kg)       Physical Exam Constitutional:      General: She is not in acute distress.    Appearance: Normal appearance. She is well-developed. She is not ill-appearing or toxic-appearing.  HENT:     Head: Normocephalic.     Right Ear: Hearing, tympanic membrane, ear canal and external ear normal. No middle ear effusion. Tympanic membrane is not erythematous, retracted or bulging.     Left Ear: Hearing, tympanic membrane, ear canal and external ear normal.  No middle ear effusion. Tympanic membrane is not erythematous, retracted or bulging.     Nose: Mucosal edema and rhinorrhea present.     Right Turbinates: Enlarged.     Left Turbinates: Enlarged.     Right Sinus: No maxillary sinus tenderness or frontal sinus tenderness.     Left Sinus: No maxillary sinus tenderness or frontal sinus tenderness.     Mouth/Throat:     Pharynx: Uvula midline.  Eyes:     General: Lids are normal. Lids are everted, no foreign bodies appreciated.     Conjunctiva/sclera: Conjunctivae normal.     Pupils: Pupils are equal, round, and reactive to light.  Neck:     Thyroid: No thyroid mass or thyromegaly.     Vascular: No carotid bruit.     Trachea: Trachea normal.  Cardiovascular:     Rate and Rhythm: Normal rate and regular rhythm.     Pulses: Normal pulses.     Heart sounds: Normal heart sounds, S1 normal and S2 normal. No murmur heard.    No friction rub. No gallop.  Pulmonary:     Effort: Pulmonary effort is normal. No tachypnea or respiratory distress.     Breath sounds: Normal breath sounds. No decreased breath sounds, wheezing, rhonchi or rales.  Abdominal:     General: Bowel sounds are normal.     Palpations: Abdomen is soft.     Tenderness: There is no abdominal tenderness.  Musculoskeletal:     Cervical back: Normal range of motion and neck supple.  Skin:    General: Skin is warm and dry.     Findings: No rash.  Neurological:     Mental Status: She is alert.  Psychiatric:        Mood and Affect:  Mood is not anxious or depressed.        Speech: Speech normal.        Behavior: Behavior normal. Behavior is cooperative.        Thought Content: Thought content normal.        Judgment: Judgment normal.       Assessment and Plan    Problem List Items Addressed  This Visit     Chronic diarrhea - Primary   Irritable bowel syndrome with diarrhea    Chronic, difficult to follow FODMAP diet, minimal improvement with probiotic and dietary changes. Interestingly enough she notes most of her symptoms are in the morning and she has no further diarrhea through the rest of the day.  We did discuss in detail whether the her symptoms could be secondary to her caffeinated coffee and associated bowel hypermobility/diuresis versus a side effect of the Prilosec.  She will consider stopping caffeinated coffee or coffee altogether, but this is difficult for her given she enjoys it. She also will try using Prilosec 20 mg midday as opposed to in the morning to see if her symptoms change.  She does not feel like she can DC the PPI.  In the future we could also consider trying Pepcid AC twice daily instead of Prilosec I will also provide cholestyramine that she can use as needed for the diarrhea.  Of note no sign and symptom of infection at this time.      Viral URI with cough    Acute, negative COVID testing. Most likely viral upper respiratory tract infection causing symptoms.  Continue Mucinex DM as needed for cough and mucus production.  Push fluids and rest.  No clear sign of bacterial infection.  Return and ER precautions provided.      Tracy Nora, MD

## 2023-04-11 NOTE — Assessment & Plan Note (Signed)
Acute, negative COVID testing. Most likely viral upper respiratory tract infection causing symptoms.  Continue Mucinex DM as needed for cough and mucus production.  Push fluids and rest.  No clear sign of bacterial infection.  Return and ER precautions provided.

## 2023-04-11 NOTE — Assessment & Plan Note (Signed)
Chronic, difficult to follow FODMAP diet, minimal improvement with probiotic and dietary changes. Interestingly enough she notes most of her symptoms are in the morning and she has no further diarrhea through the rest of the day.  We did discuss in detail whether the her symptoms could be secondary to her caffeinated coffee and associated bowel hypermobility/diuresis versus a side effect of the Prilosec.  She will consider stopping caffeinated coffee or coffee altogether, but this is difficult for her given she enjoys it. She also will try using Prilosec 20 mg midday as opposed to in the morning to see if her symptoms change.  She does not feel like she can DC the PPI.  In the future we could also consider trying Pepcid AC twice daily instead of Prilosec I will also provide cholestyramine that she can use as needed for the diarrhea.  Of note no sign and symptom of infection at this time.

## 2023-05-17 ENCOUNTER — Ambulatory Visit: Payer: Medicare Other | Admitting: Podiatry

## 2023-05-20 ENCOUNTER — Encounter: Payer: Self-pay | Admitting: Podiatry

## 2023-05-20 ENCOUNTER — Ambulatory Visit (INDEPENDENT_AMBULATORY_CARE_PROVIDER_SITE_OTHER): Payer: Medicare Other | Admitting: Podiatry

## 2023-05-20 VITALS — BP 137/63 | HR 88

## 2023-05-20 DIAGNOSIS — M79675 Pain in left toe(s): Secondary | ICD-10-CM | POA: Diagnosis not present

## 2023-05-20 DIAGNOSIS — B351 Tinea unguium: Secondary | ICD-10-CM | POA: Diagnosis not present

## 2023-05-20 DIAGNOSIS — Q828 Other specified congenital malformations of skin: Secondary | ICD-10-CM | POA: Diagnosis not present

## 2023-05-20 DIAGNOSIS — M79672 Pain in left foot: Secondary | ICD-10-CM | POA: Diagnosis not present

## 2023-05-20 DIAGNOSIS — M79674 Pain in right toe(s): Secondary | ICD-10-CM

## 2023-05-20 DIAGNOSIS — M79671 Pain in right foot: Secondary | ICD-10-CM | POA: Diagnosis not present

## 2023-05-20 NOTE — Progress Notes (Signed)
  Subjective:  Patient ID: Tracy Dixon, female    DOB: [birth year],  MRN: [id number]  Tracy Dixon presents to clinic today for painful porokeratotic lesion(s) b/l lower extremities and painful mycotic toenails that limit ambulation. Painful toenails interfere with ambulation. Aggravating factors include wearing enclosed shoe gear. Pain is relieved with periodic professional debridement. Painful porokeratotic lesions are aggravated when weightbearing with and without shoegear. Pain is relieved with periodic professional debridement.  Chief Complaint  Patient presents with   Nail Problem    "She usually cuts my toenails and cuts the thing on the bottom.  She checks the thing in between my toes."   New problem(s): None.   PCP is Excell Seltzer, MD.  Allergies  Allergen Reactions   Contrast Media [Iodinated Contrast Media] Hives   Crab Extract     Other reaction(s): Not available   Crab [Shellfish Allergy] Hives    Review of Systems: Negative except as noted in the HPI.  Objective: No changes noted in today's physical examination. Vitals:   05/20/23 1054  BP: 137/63  Pulse: 88   Tracy PETROVSKI is a pleasant 82 y.o. female WD, WN in NAD. AAO x 3.  Vascular Capillary refill time to digits immediate b/l. Palpable pedal pulses b/l LE. Pedal hair sparse. No pain with calf compression b/l. Lower extremity skin temperature gradient within normal limits. No edema noted b/l LE. Lymphedema present left lower extremity.  Neurologic Normal speech. Oriented to person, place, and time. Protective sensation intact 5/5 intact bilaterally with 10g monofilament b/l. Vibratory sensation intact b/l.  Dermatologic Pedal integument with normal turgor, texture and tone BLE. No open wounds b/l LE. No interdigital macerations noted b/l LE.   Toenails 1-5 b/l elongated, discolored, dystrophic, thickened, crumbly with subungual debris and tenderness to dorsal palpation.   Porokeratotic lesion(s) plantar  right great toe, plantarlateral midfoot x 2 and submet head 2 right foot. No erythema, no edema, no drainage, no fluctuance.  Orthopedic: Normal muscle strength 5/5 to all lower extremity muscle groups bilaterally. Hammertoe deformity noted 2-5 b/l. No pain, crepitus or joint limitation noted with ROM b/l LE.  Patient ambulates independently without assistive aids.   Radiographs: None  Assessment/Plan: 1. Pain due to onychomycosis of toenails of both feet   2. Porokeratosis   3. Foot pain, bilateral   -Patient was evaluated and treated. All patient's and/or POA's questions/concerns answered on today's visit. -Toenails 1-5 b/l were debrided in length and girth with sterile nail nippers and dremel without iatrogenic bleeding.  -Continue soft, supportive shoe gear daily. -Porokeratotic lesion(s) right great toe, submet head 2 right foot, and plantar midfoot right LE x 2 pared and enucleated with sterile currette without incident. Total number of lesions debrided=4. -Patient/POA to call should there be question/concern in the interim.   Return in about 9 weeks (around 07/22/2023).  Freddie Breech, DPM

## 2023-05-21 ENCOUNTER — Ambulatory Visit: Payer: Medicare Other | Admitting: Podiatry

## 2023-05-23 ENCOUNTER — Encounter: Payer: Self-pay | Admitting: Podiatry

## 2023-07-08 ENCOUNTER — Encounter: Payer: Self-pay | Admitting: Family Medicine

## 2023-07-10 ENCOUNTER — Encounter: Payer: Self-pay | Admitting: Family Medicine

## 2023-07-10 ENCOUNTER — Ambulatory Visit (INDEPENDENT_AMBULATORY_CARE_PROVIDER_SITE_OTHER): Payer: Medicare Other | Admitting: Family Medicine

## 2023-07-10 VITALS — BP 134/76 | HR 91 | Temp 97.2°F | Ht 60.5 in | Wt 170.0 lb

## 2023-07-10 DIAGNOSIS — M5386 Other specified dorsopathies, lumbar region: Secondary | ICD-10-CM | POA: Diagnosis not present

## 2023-07-10 DIAGNOSIS — M5442 Lumbago with sciatica, left side: Secondary | ICD-10-CM

## 2023-07-10 MED ORDER — PREDNISONE 20 MG PO TABS: ORAL_TABLET | ORAL | 0 refills | Status: DC

## 2023-07-10 MED ORDER — CYCLOBENZAPRINE HCL 10 MG PO TABS: 10.0000 mg | ORAL_TABLET | Freq: Every evening | ORAL | 0 refills | Status: DC | PRN

## 2023-07-10 NOTE — Assessment & Plan Note (Signed)
Acute flare of chronic back pain with new symptoms of left-sided sciatica. No red flags or clear indication for x-ray.  She does have some change in her bowel movements but has no incontinence. Start with prednisone taper, heat and gentle stretches. Can use muscle relaxant at bedtime as needed.  If not improving in 2 to 4 weeks consider following up for possible x-ray or referral to physical therapy.

## 2023-07-10 NOTE — Progress Notes (Signed)
Patient ID: Tracy Dixon, female    DOB: 1941-06-26, 82 y.o.   MRN: [id number]  This visit was conducted in person.  BP 134/76   Pulse 91   Temp (!) 97.2 F (36.2 C) (Temporal)   Ht 5' 0.5" (1.537 m)   Wt 170 lb (77.1 kg)   SpO2 96%   BMI 32.65 kg/m    CC:  Chief Complaint  Patient presents with   Back Pain    Increased last week.down into left leg     Subjective:   HPI: Tracy Dixon is a 82 y.o. female with history of lumbar spinal stenosis and sciatica presenting on 07/10/2023 for Back Pain (Increased last week.down into left leg )  In the past she has been followed by Dr. Shon Baton and Dr. Ethelene Hal in past.  [facility] Status post spinal fusion 2014  No further surgery indicated.     Worsening of back pain in last week.Marland Kitchen  No proceeding fall, has been sleeping in different bed.  More with leaning forward, walking helps some.   No new weakness and numbness.  No perineal numbness. No incontinence.   She has been using aleve and tylenol. Helps a little.  No keeping her up at night.    Today she reports worsening of her chronic back pain now with radiation down into the left leg.    Has to strain more lately for BMs, more firm... 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 Interim medical history since our last visit reviewed. Allergies and medications reviewed and updated. Outpatient Medications Prior to Visit  Medication Sig Dispense Refill   amLODipine (NORVASC) 5 MG tablet TAKE 1 TABLET BY MOUTH DAILY 90 tablet 3   atorvastatin (LIPITOR) 20 MG tablet TAKE 1 TABLET BY MOUTH ONCE  DAILY 90 tablet 3   cetirizine (ZYRTEC) 10 MG tablet Take 10 mg by mouth daily as needed for allergies. Reported on 01/10/2016     cholecalciferol (VITAMIN D) 1000 UNITS tablet Take 1,000 Units by mouth daily.     hydrochlorothiazide (MICROZIDE) 12.5 MG capsule TAKE 1 CAPSULE BY MOUTH DAILY 90  capsule 3   ibuprofen (ADVIL) 200 MG tablet Take 200 mg by mouth every 6 (six) hours as needed for mild pain.     omeprazole (PRILOSEC OTC) 20 MG tablet Take 20 mg by mouth daily.     Probiotic Product (PROBIOTIC BLEND PO) Take 1 capsule by mouth daily.     cholestyramine (QUESTRAN) 4 g packet Take 1 packet (4 g total) by mouth daily as needed. (Patient not taking: Reported on 05/20/2023) 60 each 0   No facility-administered medications prior to visit.     Per HPI unless specifically indicated in ROS section below Review of Systems  Constitutional:  Negative for fatigue and fever.  HENT:  Negative for congestion.   Eyes:  Negative for pain.  Respiratory:  Negative for cough and shortness of breath.   Cardiovascular:  Negative for chest pain, palpitations and leg swelling.  Gastrointestinal:  Negative for abdominal pain.  Genitourinary:  Negative for dysuria and vaginal bleeding.  Musculoskeletal:  Positive for back pain.  Neurological:  Negative for syncope, light-headedness and headaches.  Psychiatric/Behavioral:  Negative for dysphoric mood.    Objective:  BP 134/76   Pulse 91   Temp (!) 97.2 F (36.2 C) (Temporal)   Ht 5' 0.5" (1.537 m)  Wt 170 lb (77.1 kg)   SpO2 96%   BMI 32.65 kg/m   Wt Readings from Last 3 Encounters:  07/10/23 170 lb (77.1 kg)  04/11/23 171 lb 2 oz (77.6 kg)  01/22/23 172 lb 6 oz (78.2 kg)      Physical Exam Constitutional:      General: She is not in acute distress.    Appearance: Normal appearance. She is well-developed. She is not ill-appearing or toxic-appearing.  HENT:     Head: Normocephalic.     Right Ear: Hearing, tympanic membrane, ear canal and external ear normal. Tympanic membrane is not erythematous, retracted or bulging.     Left Ear: Hearing, tympanic membrane, ear canal and external ear normal. Tympanic membrane is not erythematous, retracted or bulging.     Nose: No mucosal edema or rhinorrhea.     Right Sinus: No maxillary  sinus tenderness or frontal sinus tenderness.     Left Sinus: No maxillary sinus tenderness or frontal sinus tenderness.     Mouth/Throat:     Mouth: Oropharynx is clear and moist and mucous membranes are normal.     Pharynx: Uvula midline.  Eyes:     General: Lids are normal. Lids are everted, no foreign bodies appreciated.     Extraocular Movements: EOM normal.     Conjunctiva/sclera: Conjunctivae normal.     Pupils: Pupils are equal, round, and reactive to light.  Neck:     Thyroid: No thyroid mass or thyromegaly.     Vascular: No carotid bruit.     Trachea: Trachea normal.  Cardiovascular:     Rate and Rhythm: Normal rate and regular rhythm.     Pulses: Normal pulses.     Heart sounds: Normal heart sounds, S1 normal and S2 normal. No murmur heard.    No friction rub. No gallop.  Pulmonary:     Effort: Pulmonary effort is normal. No tachypnea or respiratory distress.     Breath sounds: Normal breath sounds. No decreased breath sounds, wheezing, rhonchi or rales.  Abdominal:     General: Bowel sounds are normal.     Palpations: Abdomen is soft.     Tenderness: There is no abdominal tenderness.  Musculoskeletal:     Cervical back: Normal range of motion and neck supple.     Lumbar back: Bony tenderness present. No spasms or tenderness. Decreased range of motion. Positive left straight leg raise test. Negative right straight leg raise test.  Skin:    General: Skin is warm, dry and intact.     Findings: No rash.  Neurological:     Mental Status: She is alert.  Psychiatric:        Mood and Affect: Mood is not anxious or depressed.        Speech: Speech normal.        Behavior: Behavior normal. Behavior is cooperative.        Thought Content: Thought content normal.        Cognition and Memory: Cognition and memory normal.        Judgment: Judgment normal.       Results for orders placed or performed in visit on 01/15/23  VITAMIN D 25 Hydroxy (Vit-D Deficiency, Fractures)   Result Value Ref Range   VITD 57.77 30.00 - 100.00 ng/mL  Comprehensive metabolic panel  Result Value Ref Range   Sodium 137 135 - 145 mEq/L   Potassium 3.8 3.5 - 5.1 mEq/L   Chloride 99 96 - 112  mEq/L   CO2 30 19 - 32 mEq/L   Glucose, Bld 78 70 - 99 mg/dL   BUN 18 6 - 23 mg/dL   Creatinine, Ser 1.61 0.40 - 1.20 mg/dL   Total Bilirubin 0.4 0.2 - 1.2 mg/dL   Alkaline Phosphatase 64 39 - 117 U/L   AST 15 0 - 37 U/L   ALT 12 0 - 35 U/L   Total Protein 8.1 6.0 - 8.3 g/dL   Albumin 3.7 3.5 - 5.2 g/dL   GFR 09.60 >45.40 mL/min   Calcium 9.4 8.4 - 10.5 mg/dL  Hemoglobin J8J  Result Value Ref Range   Hgb A1c MFr Bld 5.9 4.6 - 6.5 %  Lipid panel  Result Value Ref Range   Cholesterol 133 0 - 200 mg/dL   Triglycerides 19.1 0.0 - 149.0 mg/dL   HDL 47.82 >95.62 mg/dL   VLDL 13.0 0.0 - 86.5 mg/dL   LDL Cholesterol 74 0 - 99 mg/dL   Total CHOL/HDL Ratio 3    NonHDL 90.91     Assessment and Plan  Acute left-sided low back pain with left-sided sciatica  Sciatica of left side associated with disorder of lumbar spine Assessment & Plan: Acute flare of chronic back pain with new symptoms of left-sided sciatica. No red flags or clear indication for x-ray.  She does have some change in her bowel movements but has no incontinence. Start with prednisone taper, heat and gentle stretches. Can use muscle relaxant at bedtime as needed.  If not improving in 2 to 4 weeks consider following up for possible x-ray or referral to physical therapy.     Other orders -     predniSONE; 3 tabs by mouth daily x 3 days, then 2 tabs by mouth daily x 2 days then 1 tab by mouth daily x 2 days  Dispense: 15 tablet; Refill: 0 -     Cyclobenzaprine HCl; Take 1 tablet (10 mg total) by mouth at bedtime as needed for muscle spasms.  Dispense: 15 tablet; Refill: 0    Return if symptoms worsen or fail to improve.   Kerby Nora, MD

## 2023-07-10 NOTE — Patient Instructions (Addendum)
Start prednisone taper.  Can use muscle relaxant as needed at night 1/2 to 1 tab at bedtime.  Increase water intake.  Apply heat, start home physical therapy.   Follow up in 2-4  week if not improving as expected.

## 2023-07-16 ENCOUNTER — Encounter: Payer: Self-pay | Admitting: Family Medicine

## 2023-07-17 ENCOUNTER — Encounter: Payer: Self-pay | Admitting: Family Medicine

## 2023-07-17 ENCOUNTER — Other Ambulatory Visit: Payer: Self-pay

## 2023-07-17 ENCOUNTER — Emergency Department: Payer: Medicare Other

## 2023-07-17 ENCOUNTER — Emergency Department
Admission: EM | Admit: 2023-07-17 | Discharge: 2023-07-17 | Disposition: A | Discharge status: Home / Self Care | Payer: Medicare Other | Attending: Emergency Medicine | Admitting: Emergency Medicine

## 2023-07-17 DIAGNOSIS — I10 Essential (primary) hypertension: Secondary | ICD-10-CM | POA: Insufficient documentation

## 2023-07-17 DIAGNOSIS — R300 Dysuria: Secondary | ICD-10-CM | POA: Insufficient documentation

## 2023-07-17 DIAGNOSIS — M5126 Other intervertebral disc displacement, lumbar region: Secondary | ICD-10-CM | POA: Diagnosis not present

## 2023-07-17 DIAGNOSIS — M5136 Other intervertebral disc degeneration, lumbar region: Secondary | ICD-10-CM | POA: Diagnosis not present

## 2023-07-17 DIAGNOSIS — M5416 Radiculopathy, lumbar region: Secondary | ICD-10-CM | POA: Insufficient documentation

## 2023-07-17 DIAGNOSIS — R350 Frequency of micturition: Secondary | ICD-10-CM | POA: Insufficient documentation

## 2023-07-17 DIAGNOSIS — M549 Dorsalgia, unspecified: Secondary | ICD-10-CM | POA: Diagnosis present

## 2023-07-17 DIAGNOSIS — M47816 Spondylosis without myelopathy or radiculopathy, lumbar region: Secondary | ICD-10-CM | POA: Diagnosis not present

## 2023-07-17 DIAGNOSIS — M4316 Spondylolisthesis, lumbar region: Secondary | ICD-10-CM | POA: Diagnosis not present

## 2023-07-17 LAB — URINALYSIS, ROUTINE W REFLEX MICROSCOPIC
Bacteria, UA: NONE SEEN
Bilirubin Urine: NEGATIVE
Glucose, UA: NEGATIVE mg/dL
Hgb urine dipstick: NEGATIVE
Ketones, ur: NEGATIVE mg/dL
Nitrite: NEGATIVE
Protein, ur: NEGATIVE mg/dL
Specific Gravity, Urine: 1.009 (ref 1.005–1.030)
pH: 7 (ref 5.0–8.0)

## 2023-07-17 MED ORDER — GABAPENTIN 100 MG PO CAPS
100.0000 mg | ORAL_CAPSULE | Freq: Three times a day (TID) | ORAL | 0 refills | Status: DC
Start: 2023-07-17 — End: 2023-07-19

## 2023-07-17 MED ORDER — OXYCODONE-ACETAMINOPHEN 5-325 MG PO TABS
1.0000 | ORAL_TABLET | Freq: Once | ORAL | Status: AC
  Administered 2023-07-17: 1 via ORAL
  Filled 2023-07-17: qty 1

## 2023-07-17 MED ORDER — OXYCODONE-ACETAMINOPHEN 5-325 MG PO TABS
1.0000 | ORAL_TABLET | Freq: Three times a day (TID) | ORAL | 0 refills | Status: DC | PRN
Start: 2023-07-17 — End: 2023-07-22

## 2023-07-17 MED ORDER — ONDANSETRON 4 MG PO TBDP
4.0000 mg | ORAL_TABLET | Freq: Once | ORAL | Status: AC
  Administered 2023-07-17: 4 mg via ORAL
  Filled 2023-07-17: qty 1

## 2023-07-17 MED ORDER — ONDANSETRON 4 MG PO TBDP: 4.0000 mg | ORAL_TABLET | Freq: Three times a day (TID) | ORAL | 0 refills | Status: DC | PRN

## 2023-07-17 NOTE — ED Provider Notes (Signed)
[facility] Emergency Department Provider Note     Event Date/Time   First MD Initiated Contact with Patient 07/17/23 1635     (approximate)   History   Back Pain   HPI  Tracy Dixon is a 82 y.o. female with a history of HTN, HLD, obesity, sciatica, DDD, lumbar fusion surgery, chronic pain, presents to the ED for evaluation of sciatica flare since last week.  Patient is also endorsing some urinary frequency and dysuria described as burning with urination.  She denies any fevers, chills, or sweats.  No recent injury, trauma, falls. She denies bladder/bowel incontinence, foot drop, or saddle anesthesias   Physical Exam   Triage Vital Signs: ED Triage Vitals  Encounter Vitals Group     BP 07/17/23 1544 127/68     Systolic BP Percentile --      Diastolic BP Percentile --      Pulse Rate 07/17/23 1544 98     Resp 07/17/23 1544 17     Temp 07/17/23 1544 98.4 F (36.9 C)     Temp Source 07/17/23 1544 Oral     SpO2 07/17/23 1544 96 %     Weight 07/17/23 1545 169 lb (76.7 kg)     Height 07/17/23 1545 5' (1.524 m)     Head Circumference --      Peak Flow --      Pain Score 07/17/23 1545 5     Pain Loc --      Pain Education --      Exclude from Growth Chart --     Most recent vital signs: Vitals:   07/17/23 1544 07/17/23 1632  BP: 127/68 (!) 176/86  Pulse: 98 95  Resp: 17 (!) 22  Temp: 98.4 F (36.9 C) 98 F (36.7 C)  SpO2: 96% 100%    General Awake, no distress.  HEENT NCAT. PERRL. EOMI. No rhinorrhea. Mucous membranes are moist.  CV:  Good peripheral perfusion. RRR RESP:  Normal effort. CTA ABD:  No distention.  MSK:  Normal spinal alignment. Normal LE ROM NEURO:   ED Results / Procedures / Treatments   Labs (all labs ordered are listed, but only abnormal results are displayed) Labs Reviewed  URINALYSIS, ROUTINE W REFLEX MICROSCOPIC - Abnormal; Notable for the following components:      Result Value   Color, Urine STRAW (*)     APPearance CLEAR (*)    Leukocytes,Ua SMALL (*)    All other components within normal limits  URINE CULTURE   EKG   RADIOLOGY  I personally viewed and evaluated these images as part of my medical decision making, as well as reviewing the written report by the radiologist.  ED Provider Interpretation: chronic DDD, spurring, listhesis  CT Lumbar Spine Wo Contrast  Result Date: 07/17/2023 CLINICAL DATA:  Low back pain EXAM: CT LUMBAR SPINE WITHOUT CONTRAST TECHNIQUE: Multidetector CT imaging of the lumbar spine was performed without intravenous contrast administration. Multiplanar CT image reconstructions were also generated. RADIATION DOSE REDUCTION: This exam was performed according to the departmental dose-optimization program which includes automated exposure control, adjustment of the mA and/or kV according to patient size and/or use of iterative reconstruction technique. COMPARISON:  CT abdomen and pelvis 03/20/2016 FINDINGS: Segmentation: 5 lumbar type vertebrae. Alignment: Stable slight anterolisthesis of L4 on L5. Slight retrolisthesis of L2 on L3, stable. Vertebrae: No acute fracture or focal pathologic process. Paraspinal and other soft tissues: Negative Disc levels: Prior posterior fusion at L4-5.  Advanced degenerative disc disease and facet disease throughout the lumbar spine with disc space narrowing, vacuum disc and spurring. IMPRESSION: Postoperative and degenerative changes throughout the lumbar spine. No acute bony abnormality. Electronically Signed   By: Charlett Nose M.D.   On: 07/17/2023 17:38     PROCEDURES:  Critical Care performed: No  Procedures   MEDICATIONS ORDERED IN ED: Medications  oxyCODONE-acetaminophen (PERCOCET/ROXICET) 5-325 MG per tablet 1 tablet (1 tablet Oral Given 07/17/23 1655)  ondansetron (ZOFRAN-ODT) disintegrating tablet 4 mg (4 mg Oral Given 07/17/23 1655)     IMPRESSION / MDM / ASSESSMENT AND PLAN / ED COURSE  I reviewed the triage vital  signs and the nursing notes.                              Differential diagnosis includes, but is not limited to, UTI, pyelonephritis, sciatica, lumbar DDD, spondylolisthesis, myalgias  Patient's presentation is most consistent with acute complicated illness / injury requiring diagnostic workup.  Patient's diagnosis is consistent with acute on chronic LBP with lumbar radiculopathy. CT confirms chronic changes without acute HNP or cord compression. No red flags on exam. Urine culture sent. Patient will be discharged home with prescriptions for oxycodone, Zofran, and gabapentin. Patient is to follow up with her PCP as needed or otherwise directed. Patient is given ED precautions to return to the ED for any worsening or new symptoms.   FINAL CLINICAL IMPRESSION(S) / ED DIAGNOSES   Final diagnoses:  Lumbar radiculopathy     Rx / DC Orders   ED Discharge Orders          Ordered    oxyCODONE-acetaminophen (PERCOCET) 5-325 MG tablet  Every 8 hours PRN        07/17/23 1923    ondansetron (ZOFRAN-ODT) 4 MG disintegrating tablet  Every 8 hours PRN        07/17/23 1923    gabapentin (NEURONTIN) 100 MG capsule  3 times daily        07/17/23 1923             Note:  This document was prepared using Dragon voice recognition software and may include unintentional dictation errors.    Lissa Hoard, PA-C 07/17/23 1930    Jene Every, MD 07/17/23 2016

## 2023-07-17 NOTE — Telephone Encounter (Signed)
Tracy Dixon notified by telephone that given she is weak and unable to walk associated with incontinence/urinary issues, Dr. Ermalene Searing recommends that she goes to the emergency room for urgent imaging.   Patient states understanding.

## 2023-07-17 NOTE — ED Notes (Signed)
Pt ambulated to wheelchair with minimal assistance from staff and wheeled to hall restroom. Pt voided without difficulty and was returned to stretcher. Pt transferred to bed with minimal assistance from staff. Bed is in lowest, locked position, with call bell in reach. No other needs voiced at this time.

## 2023-07-17 NOTE — Discharge Instructions (Addendum)
Your exam and labs are normal and reassuring, showing radicular pain in the left leg. Your CT scan shows significant DDD, facet arthritis, and listhesis. These changes can cause intermittently chronic lower back pain. Take the prescription meds as directed. Follow-up with your provider as discussed. Return to the ED if needed.

## 2023-07-17 NOTE — Telephone Encounter (Signed)
I spoke with pt; also see pt message 07/16/23.pt said trying not to put pressure on rt leg and now lt leg feels very weak. Pt walking with cane and when pt gets up to go to bathroom sometimes feels like legs are going to buckle. Pt now has a lot of bladder pressure and urinating good amt about every 30 mins.pt is not sure color of urine. No burning or pain upon urination.pt trying to stay in bed and pain level 5 but when pt walks pain level goes to 10. Heat is not helping; pt has finished prednisone. Muscle relaxant is taken at night and not helping pain.  Pt used to take gabapentin for burning in feet but pt is not sure gabapentin helped. Pt said her grand daughter is there and pt wants something done today. Pt wants imaging. Pt is having a lot of trouble walking legs very weak. No weakness in arms. [facility] [address]. Pt wants cb ASAP. Sending note to Dr Ermalene Searing and [facility] pool.will make CMA working with Dr Ermalene Searing aware also.

## 2023-07-17 NOTE — Telephone Encounter (Signed)
Call ASAP,  Given she is weak and unable to walk associated with incontinence/urinary issues, I would recommend emergency room visit for urgent imaging.

## 2023-07-17 NOTE — ED Triage Notes (Addendum)
Pt sts that she has been having sciatica for the last week. Pt sts that she went to her PCP but it is not working. Pt also advises that she has been having burning with urination.

## 2023-07-19 ENCOUNTER — Ambulatory Visit: Payer: Medicare Other | Admitting: Podiatry

## 2023-07-19 ENCOUNTER — Other Ambulatory Visit: Payer: Self-pay | Admitting: Family Medicine

## 2023-07-19 MED ORDER — PREGABALIN 25 MG PO CAPS: 25.0000 mg | ORAL_CAPSULE | Freq: Three times a day (TID) | ORAL | 0 refills | Status: DC

## 2023-07-20 LAB — URINE CULTURE
Culture: 20000 — AB
Special Requests: NORMAL

## 2023-07-21 DIAGNOSIS — M7062 Trochanteric bursitis, left hip: Secondary | ICD-10-CM | POA: Diagnosis not present

## 2023-07-22 ENCOUNTER — Ambulatory Visit (INDEPENDENT_AMBULATORY_CARE_PROVIDER_SITE_OTHER): Payer: Medicare Other | Admitting: Podiatry

## 2023-07-22 ENCOUNTER — Encounter: Payer: Self-pay | Admitting: Podiatry

## 2023-07-22 ENCOUNTER — Encounter: Payer: Self-pay | Admitting: Family Medicine

## 2023-07-22 DIAGNOSIS — M79674 Pain in right toe(s): Secondary | ICD-10-CM

## 2023-07-22 DIAGNOSIS — M79672 Pain in left foot: Secondary | ICD-10-CM | POA: Diagnosis not present

## 2023-07-22 DIAGNOSIS — M79671 Pain in right foot: Secondary | ICD-10-CM | POA: Diagnosis not present

## 2023-07-22 DIAGNOSIS — M79675 Pain in left toe(s): Secondary | ICD-10-CM | POA: Diagnosis not present

## 2023-07-22 DIAGNOSIS — B351 Tinea unguium: Secondary | ICD-10-CM

## 2023-07-22 DIAGNOSIS — Q828 Other specified congenital malformations of skin: Secondary | ICD-10-CM

## 2023-07-22 NOTE — Progress Notes (Signed)
  Subjective:  Patient ID: Tracy Dixon, female    DOB: 11-Apr-1941,  MRN: [id number]  Tracy Dixon presents to clinic today for: painful porokeratotic lesion(s) right foot and painful mycotic toenails that limit ambulation. Painful toenails interfere with ambulation. Aggravating factors include wearing enclosed shoe gear. Pain is relieved with periodic professional debridement. Painful porokeratotic lesions are aggravated when weightbearing with and without shoegear. Pain is relieved with periodic professional debridement. Patient has been undergoing treatment for painful sciatica. Chief Complaint  Patient presents with   Nail Problem    RFC,Referring Provider Excell Seltzer, MD,lov:08/24       PCP is Excell Seltzer, MD.  Allergies  Allergen Reactions   Contrast Media [Iodinated Contrast Media] Hives   Crab Extract     Other reaction(s): Not available   Crab [Shellfish Allergy] Hives    Review of Systems: Negative except as noted in the HPI.  Objective: No changes noted in today's physical examination. There were no vitals filed for this visit.  Tracy Dixon is a pleasant 82 y.o. female in NAD. AAO x 3.  Vascular Examination: Capillary refill time <3 seconds b/l LE. Palpable pedal pulses b/l LE. Digital hair present b/l. No pedal edema b/l. Skin temperature gradient WNL b/l. Varicosities present b/l.Marland Kitchen  Dermatological Examination: Pedal skin with normal turgor, texture and tone b/l. No open wounds. No interdigital macerations b/l. Toenails 1-5 b/l thickened, discolored, dystrophic with subungual debris. There is pain on palpation to dorsal aspect of nailplates. Porokeratotic lesion(s) R hallux, submet head 2 right foot, and plantar midfoot right LE x 2. No erythema, no edema, no drainage, no fluctuance..  Neurological Examination: Protective sensation intact with 10 gram monofilament b/l LE. Vibratory sensation intact b/l LE.   Musculoskeletal Examination: Muscle  strength 5/5 to all lower extremity muscle groups bilaterally. No pain, crepitus or joint limitation noted with ROM bilateral LE. Hammertoe deformity noted 2-5 b/l.     Latest Ref Rng & Units 01/15/2023    8:24 AM  Hemoglobin A1C  Hemoglobin-A1c 4.6 - 6.5 % 5.9    Assessment/Plan: 1. Pain due to onychomycosis of toenails of both feet   2. Porokeratosis   3. Foot pain, bilateral    -Consent given for treatment as described below: -Examined patient. -Continue supportive shoe gear daily. -Mycotic toenails 1-5 bilaterally were debrided in length and girth with sterile nail nippers and dremel without incident. -Porokeratotic lesion(s) R hallux, submet head 2 right foot, and plantar midfoot right LE x 2 pared and enucleated with sterile currette without incident. Total number of lesions debrided=4. -Patient/POA to call should there be question/concern in the interim.   Return in about 9 weeks (around 09/23/2023).  Freddie Breech, DPM

## 2023-07-23 ENCOUNTER — Telehealth: Payer: Self-pay

## 2023-07-23 NOTE — Transitions of Care (Post Inpatient/ED Visit) (Signed)
   07/23/2023  Name: EBONY GITTENS MRN: [id number] DOB: [birth year]  Today's TOC FU Call Status: Today's TOC FU Call Status:: Unsuccessful Call (1st Attempt) Unsuccessful Call (1st Attempt) Date: 07/23/23   Red on EMMI-ED Discharge Alert Date & Reason:07/17/23 "Scheduled follow-up appt? No"  Attempted to reach the patient regarding the most recent Inpatient/ED visit.  Follow Up Plan: Additional outreach attempts will be made to reach the patient to complete the Transitions of Care (Post Inpatient/ED visit) call.     Antionette Fairy, RN,BSN,CCM [facility] Care Management Care Management Community Coordinator Direct Phone: [phone] Toll Free: [phone] Fax: [phone]

## 2023-07-24 ENCOUNTER — Telehealth: Payer: Self-pay

## 2023-07-24 NOTE — Transitions of Care (Post Inpatient/ED Visit) (Signed)
07/24/2023  Name: Tracy Dixon MRN: [id number] DOB: [birth year]  Today's TOC FU Call Status: Today's TOC FU Call Status:: Successful TOC FU Call Completed TOC FU Call Complete Date: 07/24/23 Patient's Name and Date of Birth confirmed.  Transition Care Management Follow-up Telephone Call Date of Discharge: 07/17/23 Discharge Facility: [facility]) Type of Discharge: Emergency Department Reason for ED Visit: Other: ("lumbar radiculopathy") How have you been since you were released from the hospital?: Better (Pt reports she is doing a little better-stopped taking Percocet-was not helping-went to [facility] over weekend-was told she had bursitis given injection-starting to feel better. She is taking Lyrica as ordered by PCP.) Any questions or concerns?: No  Items Reviewed: Did you receive and understand the discharge instructions provided?: Yes Medications obtained,verified, and reconciled?: Yes (Medications Reviewed) (Pt states she is not taking Percocet or Neurontin that was ordered by ED MD-taking Lyrica as advised by PCP but doesn't know dosage-not with med at present) Any new allergies since your discharge?: No Dietary orders reviewed?: Yes Type of Diet Ordered:: low salt/heart healthy Do you have support at home?: No  Medications Reviewed Today: Medications Reviewed Today     Reviewed by Charlyn Minerva, RN (Registered Nurse) on 07/24/23 at [phone]  Med List Status: <None>   Medication Order Taking? Sig Documenting Provider Last Dose Status Informant  amLODipine (NORVASC) 5 MG tablet [id number] Yes TAKE 1 TABLET BY MOUTH DAILY Bedsole, Amy E, MD Taking Active   atorvastatin (LIPITOR) 20 MG tablet [id number] Yes TAKE 1 TABLET BY MOUTH ONCE  DAILY Bedsole, Amy E, MD Taking Active   cetirizine (ZYRTEC) 10 MG tablet [id number] Yes Take 10 mg by mouth daily as needed for allergies. Reported on 01/10/2016 [provider] Taking Active Self   cholecalciferol (VITAMIN D) 1000 UNITS tablet [id number] Yes Take 1,000 Units by mouth daily. [provider] Taking Active Self  cholestyramine (QUESTRAN) 4 g packet [id number] Yes Take 1 packet (4 g total) by mouth daily as needed. Excell Seltzer, MD Taking Active   cyclobenzaprine (FLEXERIL) 10 MG tablet [id number]  Take 1 tablet (10 mg total) by mouth at bedtime as needed for muscle spasms. Excell Seltzer, MD  Active   hydrochlorothiazide (MICROZIDE) 12.5 MG capsule [id number] Yes TAKE 1 CAPSULE BY MOUTH DAILY Bedsole, Amy E, MD Taking Active   ibuprofen (ADVIL) 200 MG tablet [id number] Yes Take 200 mg by mouth every 6 (six) hours as needed for mild pain. [provider] Taking Active Self  omeprazole (PRILOSEC OTC) 20 MG tablet [id number] Yes Take 20 mg by mouth daily. [provider] Taking Active   ondansetron (ZOFRAN-ODT) 4 MG disintegrating tablet [id number]  Take 1 tablet (4 mg total) by mouth every 8 (eight) hours as needed for nausea or vomiting. Menshew, Charlesetta Ivory, PA-C  Active             Home Care and Equipment/Supplies: Were Home Health Services Ordered?: NA Any new equipment or medical supplies ordered?: NA  Functional Questionnaire: Do you need assistance with bathing/showering or dressing?: No Do you need assistance with meal preparation?: No Do you need assistance with eating?: No Do you have difficulty maintaining continence: No Do you need assistance with getting out of bed/getting out of a chair/moving?: No Do you have difficulty managing or taking your medications?: No  Follow up appointments reviewed: PCP Follow-up appointment confirmed?: No MD Provider Line [phone] Given: No Specialist Hospital Follow-up appointment confirmed?: Yes Date of Specialist follow-up  appointment?: 07/30/23 Follow-Up Specialty Provider:: [facility] Do you need transportation to your follow-up appointment?: No Do you understand care options if  your condition(s) worsen?: Yes-patient verbalized understanding  SDOH Interventions Today    Flowsheet Row Most Recent Value  SDOH Interventions   Food Insecurity Interventions Intervention Not Indicated  Transportation Interventions Intervention Not Indicated      TOC Interventions Today    Flowsheet Row Most Recent Value  TOC Interventions   TOC Interventions Discussed/Reviewed TOC Interventions Discussed      Interventions Today    Flowsheet Row Most Recent Value  General Interventions   General Interventions Discussed/Reviewed General Interventions Discussed, Doctor Visits  Doctor Visits Discussed/Reviewed Doctor Visits Discussed, PCP, Specialist  PCP/Specialist Visits Compliance with follow-up visit  Education Interventions   Education Provided Provided Education  Provided Verbal Education On Other  [pain/sx mgmt]  Nutrition Interventions   Nutrition Discussed/Reviewed Nutrition Discussed  Pharmacy Interventions   Pharmacy Dicussed/Reviewed Pharmacy Topics Discussed  Safety Interventions   Safety Discussed/Reviewed Safety Discussed, Home Safety, Fall Risk        Alessandra Grout [facility] Care Management Care Management Community Coordinator Direct Phone: [phone] Toll Free: [phone] Fax: [phone]

## 2023-07-30 ENCOUNTER — Encounter: Payer: Self-pay | Admitting: Family Medicine

## 2023-07-30 DIAGNOSIS — M5416 Radiculopathy, lumbar region: Secondary | ICD-10-CM | POA: Diagnosis not present

## 2023-07-31 DIAGNOSIS — M5116 Intervertebral disc disorders with radiculopathy, lumbar region: Secondary | ICD-10-CM | POA: Diagnosis not present

## 2023-07-31 DIAGNOSIS — M5416 Radiculopathy, lumbar region: Secondary | ICD-10-CM | POA: Diagnosis not present

## 2023-07-31 DIAGNOSIS — M5117 Intervertebral disc disorders with radiculopathy, lumbosacral region: Secondary | ICD-10-CM | POA: Diagnosis not present

## 2023-07-31 DIAGNOSIS — M48061 Spinal stenosis, lumbar region without neurogenic claudication: Secondary | ICD-10-CM | POA: Diagnosis not present

## 2023-07-31 DIAGNOSIS — M4807 Spinal stenosis, lumbosacral region: Secondary | ICD-10-CM | POA: Diagnosis not present

## 2023-08-02 DIAGNOSIS — M5416 Radiculopathy, lumbar region: Secondary | ICD-10-CM | POA: Diagnosis not present

## 2023-08-07 ENCOUNTER — Encounter: Payer: Self-pay | Admitting: Family Medicine

## 2023-08-07 MED ORDER — PREGABALIN 50 MG PO CAPS: 50.0000 mg | ORAL_CAPSULE | Freq: Three times a day (TID) | ORAL | 2 refills | Status: DC

## 2023-08-09 DIAGNOSIS — M5416 Radiculopathy, lumbar region: Secondary | ICD-10-CM | POA: Diagnosis not present

## 2023-08-23 DIAGNOSIS — M5416 Radiculopathy, lumbar region: Secondary | ICD-10-CM | POA: Diagnosis not present

## 2023-08-30 DIAGNOSIS — M5416 Radiculopathy, lumbar region: Secondary | ICD-10-CM | POA: Diagnosis not present

## 2023-09-20 DIAGNOSIS — M5416 Radiculopathy, lumbar region: Secondary | ICD-10-CM | POA: Diagnosis not present

## 2023-09-23 ENCOUNTER — Ambulatory Visit (INDEPENDENT_AMBULATORY_CARE_PROVIDER_SITE_OTHER): Payer: Medicare Other | Admitting: Podiatry

## 2023-09-23 ENCOUNTER — Encounter: Payer: Self-pay | Admitting: Podiatry

## 2023-09-23 VITALS — Ht 60.0 in | Wt 169.0 lb

## 2023-09-23 DIAGNOSIS — M79672 Pain in left foot: Secondary | ICD-10-CM

## 2023-09-23 DIAGNOSIS — M79674 Pain in right toe(s): Secondary | ICD-10-CM | POA: Diagnosis not present

## 2023-09-23 DIAGNOSIS — Q828 Other specified congenital malformations of skin: Secondary | ICD-10-CM

## 2023-09-23 DIAGNOSIS — B351 Tinea unguium: Secondary | ICD-10-CM

## 2023-09-23 DIAGNOSIS — M79675 Pain in left toe(s): Secondary | ICD-10-CM | POA: Diagnosis not present

## 2023-09-23 DIAGNOSIS — M79671 Pain in right foot: Secondary | ICD-10-CM

## 2023-09-23 NOTE — Progress Notes (Signed)
  Subjective:  Patient ID: Tracy Dixon, female    DOB: Jul 20, 1941,  MRN: [id number]  82 y.o. female presents painful porokeratotic lesion(s) of both feet and painful mycotic toenails that limit ambulation. Painful toenails interfere with ambulation. Aggravating factors include wearing enclosed shoe gear. Pain is relieved with periodic professional debridement. Painful porokeratotic lesions are aggravated when weightbearing with and without shoegear. Pain is relieved with periodic professional debridement. Chief Complaint  Patient presents with   Nail Problem    RFC, Pt is not a diabetic, last office visit was in [place] PCP is Dr Ermalene Searing.   New problem(s): None   PCP is Excell Seltzer, MD , and last visit was July 10, 2023.  Allergies  Allergen Reactions   Contrast Media [Iodinated Contrast Media] Hives   Crab Extract     Other reaction(s): Not available   Crab [Shellfish Allergy] Hives   Review of Systems: Negative except as noted in the HPI.   Objective:  Tracy Dixon is a pleasant 82 y.o. female WD, WN in NAD. AAO x 3.  Vascular Examination: Vascular status intact b/l with palpable pedal pulses. CFT immediate b/l. Pedal hair present. No edema. No pain with calf compression b/l. Skin temperature gradient WNL b/l. No varicosities noted. No cyanosis or clubbing noted.  Neurological Examination: Sensation grossly intact b/l with 10 gram monofilament. Vibratory sensation intact b/l.  Dermatological Examination: Pedal skin with normal turgor, texture and tone b/l. No open wounds nor interdigital macerations noted. Toenails 1-5 b/l thick, discolored, elongated with subungual debris and pain on dorsal palpation.   Porokeratotic lesion(s) R hallux, submet head 2 right foot, and plantar midfoot right LE x 2. No erythema, no edema, no drainage, no fluctuance.  Musculoskeletal Examination: Muscle strength 5/5 to b/l LE.  No pain, crepitus noted b/l. No gross pedal  deformities. Patient ambulates independently without assistive aids.   Radiographs: None  Last A1c:      Latest Ref Rng & Units 01/15/2023    8:24 AM  Hemoglobin A1C  Hemoglobin-A1c 4.6 - 6.5 % 5.9    Assessment:   1. Pain due to onychomycosis of toenails of both feet   2. Porokeratosis   3. Foot pain, bilateral    Plan:  -Patient was evaluated today. All questions/concerns addressed on today's visit. -Toenails 1-5 b/l were debrided in length and girth with sterile nail nippers and dremel without iatrogenic bleeding.  -Porokeratotic lesion(s) right great toe, submet head 2 right foot, and plantar midfoot right LE x 2 pared and enucleated with sterile currette without incident. Total number of lesions debrided=4. -Patient/POA to call should there be question/concern in the interim.  Return in about 9 weeks (around 11/25/2023).  Freddie Breech, DPM

## 2023-09-25 ENCOUNTER — Ambulatory Visit (INDEPENDENT_AMBULATORY_CARE_PROVIDER_SITE_OTHER): Payer: Medicare Other

## 2023-09-25 DIAGNOSIS — Z23 Encounter for immunization: Secondary | ICD-10-CM | POA: Diagnosis not present

## 2023-09-26 DIAGNOSIS — M7062 Trochanteric bursitis, left hip: Secondary | ICD-10-CM | POA: Diagnosis not present

## 2023-09-26 DIAGNOSIS — M5416 Radiculopathy, lumbar region: Secondary | ICD-10-CM | POA: Diagnosis not present

## 2023-09-27 ENCOUNTER — Ambulatory Visit: Payer: Medicare Other | Admitting: Podiatry

## 2023-09-29 ENCOUNTER — Encounter: Payer: Self-pay | Admitting: Podiatry

## 2023-10-14 DIAGNOSIS — M5416 Radiculopathy, lumbar region: Secondary | ICD-10-CM | POA: Diagnosis not present

## 2023-10-27 ENCOUNTER — Encounter: Payer: Self-pay | Admitting: Family Medicine

## 2023-10-29 DIAGNOSIS — M5416 Radiculopathy, lumbar region: Secondary | ICD-10-CM | POA: Diagnosis not present

## 2023-11-01 DIAGNOSIS — H2511 Age-related nuclear cataract, right eye: Secondary | ICD-10-CM | POA: Diagnosis not present

## 2023-11-01 DIAGNOSIS — H31002 Unspecified chorioretinal scars, left eye: Secondary | ICD-10-CM | POA: Diagnosis not present

## 2023-11-03 ENCOUNTER — Ambulatory Visit
Admission: RE | Admit: 2023-11-03 | Discharge: 2023-11-03 | Disposition: A | Discharge status: Home / Self Care | Payer: Medicare Other | Source: Ambulatory Visit | Attending: Emergency Medicine | Admitting: Emergency Medicine

## 2023-11-03 VITALS — BP 137/80 | HR 97 | Temp 97.8°F | Resp 20 | Ht 61.5 in | Wt 170.0 lb

## 2023-11-03 DIAGNOSIS — R350 Frequency of micturition: Secondary | ICD-10-CM | POA: Diagnosis not present

## 2023-11-03 LAB — POCT URINALYSIS DIP (MANUAL ENTRY)
Bilirubin, UA: NEGATIVE
Glucose, UA: NEGATIVE mg/dL
Ketones, POC UA: NEGATIVE mg/dL
Nitrite, UA: NEGATIVE
Protein Ur, POC: 30 mg/dL — AB
Spec Grav, UA: 1.015 (ref 1.010–1.025)
Urobilinogen, UA: 0.2 U/dL
pH, UA: 6.5 (ref 5.0–8.0)

## 2023-11-03 NOTE — Discharge Instructions (Signed)
Please return urine sample by 3:30 PM at the latest  On evaluation there is no tenderness to your kidneys or bladder  Continue to increase your fluid intake until all symptoms have fully resolved  Ensure that you are practicing good hygiene measures wiping from front to back and avoiding scented vaginal products  May follow-up with urgent care or your primary doctor as needed

## 2023-11-03 NOTE — ED Provider Notes (Signed)
Renaldo Fiddler    CSN: [id number] Arrival date & time: 11/03/23  1029      History   Chief Complaint Chief Complaint  Patient presents with   Urinary Frequency    Entered by patient    HPI Tracy Dixon is a 82 y.o. female.   Patient presents for evaluation of dysuria beginning 2 days ago as well as urinary frequency.  Increase fluid intake and dysuria resolved.  Denies hematuria, abdominal or flank pain, fever or vaginal symptoms.  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[facility])    followed by [facility], dx in [place]   Allergy    Arthritis    Asthma    Complication of anesthesia    stays sleepy   Diverticulosis of colon    Gallstones    Hyperlipidemia    Hypertension    Osteoarthritis    Symptomatic mammary hypertrophy     Patient Active Problem List   Diagnosis Date Noted   Viral URI with cough 04/11/2023   Chronic diarrhea 01/22/2023   Pain in joint of left knee 12/28/2022   Arthritis of hand 03/19/2022   Cervical radiculopathy 07/26/2021   Acquired trigger finger of left middle finger 07/06/2021   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   Gastroesophageal reflux disease 12/13/2020   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   Pain and swelling of left lower leg 03/24/2020   Vitamin D deficiency 01/21/2020   Prediabetes 01/21/2020   History of bilateral breast reduction surgery 08/27/2018   Chronic venous insufficiency 05/12/2018   Lymphedema 05/12/2018   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   Metabolic syndrome 09/30/2014   Spinal stenosis of lumbar region 03/17/2013   Acute left-sided low back pain with left-sided sciatica 09/28/2011   Osteopenia 05/23/2009   Sciatica of left side associated with disorder of lumbar spine 09/25/2007   High cholesterol 09/22/2007   OBESITY 09/22/2007   Essential hypertension, benign 09/22/2007   Varicose veins with pain 09/22/2007   Allergic rhinitis 09/22/2007   Mild  intermittent asthma 09/22/2007   Diverticulosis of colon 09/22/2007   DJD (degenerative joint disease) 09/22/2007    Past Surgical History:  Procedure Laterality Date   Bilateral tubal ligation  2005   BREAST REDUCTION SURGERY Bilateral 06/04/2018   Procedure: MAMMARY REDUCTION  (BREAST);  Surgeon: Peggye Form, DO;  Location: [facility];  Service: Plastics;  Laterality: Bilateral;   Carotid doppler  07/2006   Neg   CESAREAN SECTION  2005   CHOLECYSTECTOMY  1982   DECOMPRESSIVE LUMBAR LAMINECTOMY LEVEL 3  06/10/2013   Procedure: DECOMPRESSIVE LUMBAR LAMINECTOMY LEVEL 3;  Surgeon: Venita Lick, MD;  Location: [facility] OR;  Service: Orthopedics;;  lumbar three to five decompression   MRI brain  2007   acoustic neuroma (check yearly with MRI)   PFT's  12/2006   (-) FEV1/FVC 79%, FEV1 84%   REDUCTION MAMMAPLASTY Bilateral 05/2018   REPLACEMENT TOTAL KNEE BILATERAL  2005   TONSILLECTOMY  2005   TRIGGER FINGER RELEASE Left 09/05/2022   TUBAL LIGATION      OB History   No obstetric history on file.      Home Medications    Prior to Admission medications   Medication Sig Start Date End Date Taking? Authorizing Provider  amLODipine (NORVASC) 5 MG tablet TAKE 1 TABLET BY MOUTH DAILY 02/14/23   Bedsole, Amy E, MD  atorvastatin (LIPITOR) 20 MG tablet TAKE 1 TABLET BY MOUTH ONCE  DAILY 01/30/23   Bedsole, Amy E, MD  cetirizine (ZYRTEC) 10 MG tablet Take 10 mg by mouth daily as needed for allergies. Reported on 01/10/2016    [provider]  cholecalciferol (VITAMIN D) 1000 UNITS tablet Take 1,000 Units by mouth daily.    [provider]  cholestyramine (QUESTRAN) 4 g packet Take 1 packet (4 g total) by mouth daily as needed. 04/11/23   Bedsole, Amy E, MD  cyclobenzaprine (FLEXERIL) 10 MG tablet Take 1 tablet (10 mg total) by mouth at bedtime as needed for muscle spasms. 07/10/23   Bedsole, Amy E, MD  hydrochlorothiazide (MICROZIDE) 12.5 MG capsule TAKE 1  CAPSULE BY MOUTH DAILY 02/26/23   Bedsole, Amy E, MD  ibuprofen (ADVIL) 200 MG tablet Take 200 mg by mouth every 6 (six) hours as needed for mild pain.    [provider]  omeprazole (PRILOSEC OTC) 20 MG tablet Take 20 mg by mouth daily.    [provider]  ondansetron (ZOFRAN-ODT) 4 MG disintegrating tablet Take 1 tablet (4 mg total) by mouth every 8 (eight) hours as needed for nausea or vomiting. 07/17/23   Menshew, Charlesetta Ivory, PA-C  pregabalin (LYRICA) 50 MG capsule Take 1 capsule (50 mg total) by mouth 3 (three) times daily. 08/07/23   CoplandKarleen Hampshire, MD    Family History Family History  Problem Relation Age of Onset   Cancer Mother        Lung CA   Stroke Mother        CVA   Cancer Father        Prostate   Heart disease Neg Hx    Breast cancer Neg Hx     Social History Social History   Tobacco Use   Smoking status: Former    Current packs/day: 0.00    Types: Cigarettes    Start date: 06/02/1958    Quit date: 06/02/1973    Years since quitting: 50.4   Smokeless tobacco: Never   Tobacco comments:    few cigs/day  Vaping Use   Vaping status: Never Used  Substance Use Topics   Alcohol use: No   Drug use: No     Allergies   Contrast media [iodinated contrast media], Crab extract, and Crab [shellfish allergy]   Review of Systems Review of Systems   Physical Exam Triage Vital Signs ED Triage Vitals  Encounter Vitals Group     BP 11/03/23 1120 137/80     Systolic BP Percentile --      Diastolic BP Percentile --      Pulse Rate 11/03/23 1120 97     Resp 11/03/23 1120 20     Temp 11/03/23 1120 97.8 F (36.6 C)     Temp Source 11/03/23 1120 Oral     SpO2 11/03/23 1120 94 %     Weight 11/03/23 1117 170 lb (77.1 kg)     Height 11/03/23 1117 5' 1.5" (1.562 m)     Head Circumference --      Peak Flow --      Pain Score 11/03/23 1117 4     Pain Loc --      Pain Education --      Exclude from Growth Chart --    No data found.  Updated Vital  Signs BP 137/80 (BP Location: Left Arm)   Pulse 97   Temp 97.8 F (36.6 C) (Oral)   Resp 20   Ht 5' 1.5" (1.562 m)   Wt  170 lb (77.1 kg)   SpO2 94%   BMI 31.60 kg/m   Visual Acuity Right Eye Distance:   Left Eye Distance:   Bilateral Distance:    Right Eye Near:   Left Eye Near:    Bilateral Near:     Physical Exam Constitutional:      Appearance: Normal appearance.  Eyes:     Extraocular Movements: Extraocular movements intact.  Pulmonary:     Effort: Pulmonary effort is normal.  Abdominal:     General: Abdomen is flat. Bowel sounds are normal. There is no distension.     Palpations: Abdomen is soft.     Tenderness: There is no abdominal tenderness. There is no right CVA tenderness, left CVA tenderness or guarding.  Neurological:     Mental Status: She is alert and oriented to person, place, and time.      UC Treatments / Results  Labs (all labs ordered are listed, but only abnormal results are displayed) Labs Reviewed  URINE CULTURE  POCT URINALYSIS DIP (MANUAL ENTRY)    EKG   Radiology No results found.  Procedures Procedures (including critical care time)  Medications Ordered in UC Medications - No data to display  Initial Impression / Assessment and Plan / UC Course  I have reviewed the triage vital signs and the nursing notes.  Pertinent labs & imaging results that were available during my care of the patient were reviewed by me and considered in my medical decision making (see chart for details).  Urinary frequency  Unable to provide sample in clinic, will return with sample, no CVA tenderness or suprapubic tenderness on exam, recommended supportive measures with follow-up as needed Final Clinical Impressions(s) / UC Diagnoses   Final diagnoses:  Urinary frequency     Discharge Instructions      Please return urine sample by 3:30 PM at the latest  On evaluation there is no tenderness to your kidneys or bladder  Continue to  increase your fluid intake until all symptoms have fully resolved  Ensure that you are practicing good hygiene measures wiping from front to back and avoiding scented vaginal products  May follow-up with urgent care or your primary doctor as needed   ED Prescriptions   None    PDMP not reviewed this encounter.   Valinda Hoar, NP 11/03/23 1149

## 2023-11-03 NOTE — ED Triage Notes (Signed)
Patient states urinary frequency and burning for 2 days.  No OTC meds

## 2023-11-05 LAB — URINE CULTURE: Culture: >=100000 — AB

## 2023-11-06 ENCOUNTER — Telehealth (HOSPITAL_COMMUNITY): Payer: Self-pay

## 2023-11-06 MED ORDER — CEPHALEXIN 500 MG PO CAPS: 500.0000 mg | ORAL_CAPSULE | Freq: Three times a day (TID) | ORAL | 0 refills | Status: AC

## 2023-11-06 NOTE — Telephone Encounter (Signed)
Per VO by A. White, NP, "keflex 500 tid for 5 days" for UTI tx. Attempted to reach patient x1. Unable to LVM. Rx sent to pharmacy on file.

## 2023-11-07 DIAGNOSIS — M5416 Radiculopathy, lumbar region: Secondary | ICD-10-CM | POA: Diagnosis not present

## 2023-11-12 ENCOUNTER — Other Ambulatory Visit: Payer: Self-pay | Admitting: Family Medicine

## 2023-11-12 NOTE — Telephone Encounter (Signed)
Last office visit 07/10/23 for back pain.  Last refilled 08/07/2023 for #90 with 2 refills.  Next Appt: CPE 01/28/2024.

## 2023-11-13 DIAGNOSIS — M5416 Radiculopathy, lumbar region: Secondary | ICD-10-CM | POA: Diagnosis not present

## 2023-12-04 DIAGNOSIS — M5416 Radiculopathy, lumbar region: Secondary | ICD-10-CM | POA: Diagnosis not present

## 2023-12-05 ENCOUNTER — Ambulatory Visit: Payer: Medicare Other | Admitting: Podiatry

## 2023-12-05 ENCOUNTER — Encounter: Payer: Self-pay | Admitting: Podiatry

## 2023-12-05 VITALS — Ht 61.5 in | Wt 170.0 lb

## 2023-12-05 DIAGNOSIS — M79672 Pain in left foot: Secondary | ICD-10-CM | POA: Diagnosis not present

## 2023-12-05 DIAGNOSIS — M79674 Pain in right toe(s): Secondary | ICD-10-CM

## 2023-12-05 DIAGNOSIS — Q828 Other specified congenital malformations of skin: Secondary | ICD-10-CM | POA: Diagnosis not present

## 2023-12-05 DIAGNOSIS — M79671 Pain in right foot: Secondary | ICD-10-CM | POA: Diagnosis not present

## 2023-12-05 DIAGNOSIS — B351 Tinea unguium: Secondary | ICD-10-CM | POA: Diagnosis not present

## 2023-12-05 DIAGNOSIS — M5416 Radiculopathy, lumbar region: Secondary | ICD-10-CM | POA: Diagnosis not present

## 2023-12-05 DIAGNOSIS — M79675 Pain in left toe(s): Secondary | ICD-10-CM

## 2023-12-05 DIAGNOSIS — M48061 Spinal stenosis, lumbar region without neurogenic claudication: Secondary | ICD-10-CM | POA: Diagnosis not present

## 2023-12-10 ENCOUNTER — Other Ambulatory Visit: Payer: Self-pay

## 2023-12-10 ENCOUNTER — Ambulatory Visit
Admission: RE | Admit: 2023-12-10 | Discharge: 2023-12-10 | Disposition: A | Discharge status: Home / Self Care | Payer: Medicare Other | Source: Ambulatory Visit | Attending: Emergency Medicine | Admitting: Emergency Medicine

## 2023-12-10 VITALS — BP 144/83 | HR 103 | Temp 98.0°F | Resp 19

## 2023-12-10 DIAGNOSIS — R300 Dysuria: Secondary | ICD-10-CM | POA: Diagnosis not present

## 2023-12-10 LAB — POCT URINALYSIS DIP (MANUAL ENTRY)
Glucose, UA: 500 mg/dL — AB
Nitrite, UA: POSITIVE — AB
Protein Ur, POC: >=300 mg/dL — AB
Spec Grav, UA: <=1.005 — AB (ref 1.010–1.025)
Urobilinogen, UA: >=8 U/dL — AB
pH, UA: 5 (ref 5.0–8.0)

## 2023-12-10 MED ORDER — CIPROFLOXACIN HCL 500 MG PO TABS: 500.0000 mg | ORAL_TABLET | Freq: Two times a day (BID) | ORAL | 0 refills | Status: AC

## 2023-12-10 NOTE — ED Triage Notes (Addendum)
 Patient arrives with complaints of urinary frequency and burning x4 days. No fevers vomiting. Patient states that she took Azo with no relief.  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

## 2023-12-10 NOTE — Discharge Instructions (Addendum)
 Urine sample will be sent to the lab for 3 days to determine if bacteria will grow, if we need to make adjustments to your medicine you will be notified  Begin use of ciprofloxacin  every morning and every evening for 3 days this antibiotic was based off your urine culture results which showed E. coli  You may use over-the-counter Azo to help minimize your symptoms until antibiotic removes bacteria, this medication will turn your urine orange  Increase your fluid intake through use of water  As always practice good hygiene, wiping front to back and avoidance of scented vaginal products to prevent further irritation  If symptoms continue to persist after use of medication or recur please follow-up with urgent care or your primary doctor as needed

## 2023-12-10 NOTE — ED Provider Notes (Addendum)
 Tracy Dixon    CSN: [id number] Arrival date & time: 12/10/23  1319      History   Chief Complaint Chief Complaint  Patient presents with   Urinary Frequency   Dysuria    HPI Tracy Dixon is a 83 y.o. female.   Patient presents for evaluation of dysuria, urinary frequency present for 4 days.  Has attempted use of Azo which has been helpful.  Denies fever, abdominal, flank pain, hematuria or vaginal symptoms.  Recent UTI 1 month ago cleared with use of antibiotic.  Endorses over the last few months she has begun to wear panty liners, unsure if related, denies incontinence.   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[facility])    followed by [facility], dx in [place]    Allergy     Arthritis    Asthma    Complication of anesthesia    stays sleepy   Diverticulosis of colon    Gallstones    Hyperlipidemia    Hypertension    Osteoarthritis    Symptomatic mammary hypertrophy     Patient Active Problem List   Diagnosis Date Noted   Viral URI with cough 04/11/2023   Chronic diarrhea 01/22/2023   Pain in joint of left knee 12/28/2022   Arthritis of hand 03/19/2022   Cervical radiculopathy 07/26/2021   Acquired trigger finger of left middle finger 07/06/2021   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   Gastroesophageal reflux disease 12/13/2020   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   Pain and swelling of left lower leg 03/24/2020   Vitamin D  deficiency 01/21/2020   Prediabetes 01/21/2020   History of bilateral breast reduction surgery 08/27/2018   Chronic venous insufficiency 05/12/2018   Lymphedema 05/12/2018   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   Metabolic syndrome 09/30/2014   Spinal stenosis of lumbar region 03/17/2013   Acute left-sided low back pain with left-sided sciatica 09/28/2011   Osteopenia 05/23/2009   Sciatica of left side associated with disorder of lumbar spine 09/25/2007   High cholesterol 09/22/2007    OBESITY 09/22/2007   Essential hypertension, benign 09/22/2007   Varicose veins with pain 09/22/2007   Allergic rhinitis 09/22/2007   Mild intermittent asthma 09/22/2007   Diverticulosis of colon 09/22/2007   DJD (degenerative joint disease) 09/22/2007    Past Surgical History:  Procedure Laterality Date   Bilateral tubal ligation  2005   BREAST REDUCTION SURGERY Bilateral 06/04/2018   Procedure: MAMMARY REDUCTION  (BREAST);  Surgeon: Lowery Estefana RAMAN, DO;  Location: [facility];  Service: Plastics;  Laterality: Bilateral;   Carotid doppler  07/2006   Neg   CESAREAN SECTION  2005   CHOLECYSTECTOMY  1982   DECOMPRESSIVE LUMBAR LAMINECTOMY LEVEL 3  06/10/2013   Procedure: DECOMPRESSIVE LUMBAR LAMINECTOMY LEVEL 3;  Surgeon: Donaciano Sprang, MD;  Location: [facility] OR;  Service: Orthopedics;;  lumbar three to five decompression   MRI brain  2007   acoustic neuroma (check yearly with MRI)   PFT's  12/2006   (-) FEV1/FVC 79%, FEV1 84%   REDUCTION MAMMAPLASTY Bilateral 05/2018   REPLACEMENT TOTAL KNEE BILATERAL  2005   TONSILLECTOMY  2005   TRIGGER FINGER RELEASE Left 09/05/2022   TUBAL LIGATION      OB History   No obstetric history on file.      Home Medications    Prior to Admission medications   Medication Sig Start Date End Date Taking? Authorizing Provider  ciprofloxacin  (CIPRO ) 500 MG  tablet Take 1 tablet (500 mg total) by mouth 2 (two) times daily for 3 days. 12/10/23 12/13/23 Yes Letroy Vazguez R, NP  amLODipine  (NORVASC ) 5 MG tablet TAKE 1 TABLET BY MOUTH DAILY 02/14/23   Bedsole, Amy E, MD  atorvastatin  (LIPITOR) 20 MG tablet TAKE 1 TABLET BY MOUTH ONCE  DAILY 01/30/23   Bedsole, Amy E, MD  cetirizine (ZYRTEC) 10 MG tablet Take 10 mg by mouth daily as needed for allergies. Reported on 01/10/2016    [provider]  cholecalciferol (VITAMIN D ) 1000 UNITS tablet Take 1,000 Units by mouth daily.    [provider]  cholestyramine  (QUESTRAN ) 4 g  packet Take 1 packet (4 g total) by mouth daily as needed. 04/11/23   Bedsole, Amy E, MD  hydrochlorothiazide  (MICROZIDE ) 12.5 MG capsule TAKE 1 CAPSULE BY MOUTH DAILY 02/26/23   Bedsole, Amy E, MD  ibuprofen  (ADVIL ) 200 MG tablet Take 200 mg by mouth every 6 (six) hours as needed for mild pain.    [provider]  omeprazole (PRILOSEC OTC) 20 MG tablet Take 20 mg by mouth daily.    [provider]    Family History Family History  Problem Relation Age of Onset   Cancer Mother        Lung CA   Stroke Mother        CVA   Cancer Father        Prostate   Heart disease Neg Hx    Breast cancer Neg Hx     Social History Social History   Tobacco Use   Smoking status: Former    Current packs/day: 0.00    Types: Cigarettes    Start date: 06/02/1958    Quit date: 06/02/1973    Years since quitting: 50.5   Smokeless tobacco: Never   Tobacco comments:    few cigs/day  Vaping Use   Vaping status: Never Used  Substance Use Topics   Alcohol use: No   Drug use: No     Allergies   Contrast media [iodinated contrast media], Crab extract, and Crab [shellfish allergy ]   Review of Systems Review of Systems   Physical Exam Triage Vital Signs ED Triage Vitals  Encounter Vitals Group     BP 12/10/23 1335 (!) 144/83     Systolic BP Percentile --      Diastolic BP Percentile --      Pulse Rate 12/10/23 1335 (!) 103     Resp 12/10/23 1334 19     Temp 12/10/23 1334 98 F (36.7 C)     Temp src --      SpO2 12/10/23 1335 100 %     Weight --      Height --      Head Circumference --      Peak Flow --      Pain Score 12/10/23 1336 0     Pain Loc --      Pain Education --      Exclude from Growth Chart --    No data found.  Updated Vital Signs BP (!) 144/83 (BP Location: Left Arm)   Pulse (!) 103   Temp 98 F (36.7 C)   Resp 19   SpO2 100%   Visual Acuity Right Eye Distance:   Left Eye Distance:   Bilateral Distance:    Right Eye Near:   Left Eye Near:     Bilateral Near:     Physical Exam Constitutional:  Appearance: Normal appearance.  Eyes:     Extraocular Movements: Extraocular movements intact.  Pulmonary:     Effort: Pulmonary effort is normal.  Abdominal:     Tenderness: There is no right CVA tenderness or left CVA tenderness.  Genitourinary:    Comments: deferred Neurological:     Mental Status: She is alert and oriented to person, place, and time.      UC Treatments / Results  Labs (all labs ordered are listed, but only abnormal results are displayed) Labs Reviewed  URINE CULTURE  POCT URINALYSIS DIP (MANUAL ENTRY)    EKG   Radiology No results found.  Procedures Procedures (including critical care time)  Medications Ordered in UC Medications - No data to display  Initial Impression / Assessment and Plan / UC Course  I have reviewed the triage vital signs and the nursing notes.  Pertinent labs & imaging results that were available during my care of the patient were reviewed by me and considered in my medical decision making (see chart for details).  Dysuria  Able to provide urine sample in clinic, has been sent home with kit, will return, electively initiating Cipro  due to history of reoccurring infections, based on last urine culture on 11/03/2023 which indicated E. coli, discussed this with patient, recommended continue supportive care with follow-up as needed Final Clinical Impressions(s) / UC Diagnoses   Final diagnoses:  Dysuria     Discharge Instructions      Your urinalysis shows Arlina Sabina blood cells and nitrates which are indicative of infection, your urine will be sent to the lab to determine exactly which bacteria is present, if any changes need to be made to your medications you will be notified  Begin use of ciprofloxacin  every morning and every evening for 3 days this antibiotic was based off your urine culture results which showed E. coli  You may use over-the-counter Azo to help  minimize your symptoms until antibiotic removes bacteria, this medication will turn your urine orange  Increase your fluid intake through use of water  As always practice good hygiene, wiping front to back and avoidance of scented vaginal products to prevent further irritation  If symptoms continue to persist after use of medication or recur please follow-up with urgent care or your primary doctor as needed    ED Prescriptions     Medication Sig Dispense Auth. Provider   ciprofloxacin  (CIPRO ) 500 MG tablet Take 1 tablet (500 mg total) by mouth 2 (two) times daily for 3 days. 6 tablet Lizza Huffaker R, NP      PDMP not reviewed this encounter.   Teresa Shelba SAUNDERS, NP 12/10/23 1405    Teresa Shelba SAUNDERS, NP 12/10/23 1408

## 2023-12-10 NOTE — ED Notes (Signed)
 Patient unable to provide urine sample at this time. Provider, A. White aware.

## 2023-12-11 ENCOUNTER — Other Ambulatory Visit: Payer: Self-pay | Admitting: Family Medicine

## 2023-12-13 ENCOUNTER — Telehealth (HOSPITAL_BASED_OUTPATIENT_CLINIC_OR_DEPARTMENT_OTHER): Payer: Self-pay

## 2023-12-13 DIAGNOSIS — M5416 Radiculopathy, lumbar region: Secondary | ICD-10-CM | POA: Diagnosis not present

## 2023-12-13 LAB — URINE CULTURE: Culture: 70000 — AB

## 2023-12-13 MED ORDER — CIPROFLOXACIN HCL 500 MG PO TABS: 500.0000 mg | ORAL_TABLET | Freq: Two times a day (BID) | ORAL | 0 refills | Status: AC

## 2023-12-13 NOTE — Telephone Encounter (Signed)
TC to pt to advise of culture results. Pt reports continued frequency with improvement of dysuria. Requesting longer course.  VO per Harle Stanford, NP for Cipro 500mg  BID x3 days.

## 2023-12-14 ENCOUNTER — Encounter: Payer: Self-pay | Admitting: Podiatry

## 2023-12-14 NOTE — Progress Notes (Signed)
  Subjective:  Patient ID: Tracy Dixon, female    DOB: 11-Sep-1941,  MRN: [id number]  83 y.o. female presents painful porokeratotic lesion(s) b/l feet and painful mycotic toenails that limit ambulation. Painful toenails interfere with ambulation. Aggravating factors include wearing enclosed shoe gear. Pain is relieved with periodic professional debridement. Painful porokeratotic lesions are aggravated when weightbearing with and without shoegear. Pain is relieved with periodic professional debridement.  Chief Complaint  Patient presents with   Nail Problem    Pt is here for RFC not a diabetic PCP is Dr Ermalene Searing and LOV was during the summer.   New problem(s): None   PCP is Excell Seltzer, MD , and last visit was July 10, 2023.  Allergies  Allergen Reactions   Contrast Media [Iodinated Contrast Media] Hives   Crab Extract     Other reaction(s): Not available   Crab [Shellfish Allergy] Hives    Review of Systems: Negative except as noted in the HPI.   Objective:  Tracy Dixon is a pleasant 83 y.o. female WD, WN in NAD. AAO x 3.  Vascular Examination: Capillary refill time to digits immediate b/l. Palpable DP pulse(s) b/l LE. Palpable PT pulse(s) b/l LE. No pain with calf compression b/l. Lower extremity skin temperature gradient within normal limits. Trace edema noted BLE. Varicosities present b/l. No cyanosis or clubbing noted b/l LE.   Neurological Examination: Sensation grossly intact b/l with 10 gram monofilament. Vibratory sensation intact b/l.  Dermatological Examination: Pedal skin with normal turgor, texture and tone b/l. No open wounds nor interdigital macerations noted. Toenails 1-5 b/l thick, discolored, elongated with subungual debris and pain on dorsal palpation.  Porokeratotic lesion(s) plantar midfoot x 2, right great toe, and submet head 2 right foot. No erythema, no edema, no drainage, no fluctuance.  Musculoskeletal Examination: Muscle strength 5/5 to b/l  LE.  No pain, crepitus noted b/l. No gross pedal deformities. Patient ambulates independently without assistive aids.   Radiographs: None  Last A1c:      Latest Ref Rng & Units 01/15/2023    8:24 AM  Hemoglobin A1C  Hemoglobin-A1c 4.6 - 6.5 % 5.9      Assessment:   1. Pain due to onychomycosis of toenails of both feet   2. Porokeratosis   3. Foot pain, bilateral     Plan:  -Consent given for treatment as described below: -Examined patient. -Mycotic toenails 1-5 bilaterally were debrided in length and girth with sterile nail nippers and dremel without incident. -Porokeratotic lesion(s) plantar midfoot x 2 right foot, right great toe, and submet head 2 right foot pared and enucleated with sterile currette without incident. Total number of lesions debrided=4. -Patient/POA to call should there be question/concern in the interim.  Return in about 9 weeks (around 02/06/2024).  Freddie Breech, DPM      [place] LOCATION: [address], [place] [address]                   Office [phone]   [facility] LOCATION: [address] [place], [place] [address] Office [phone]

## 2023-12-16 ENCOUNTER — Encounter: Payer: Self-pay | Admitting: Family Medicine

## 2023-12-24 DIAGNOSIS — M5416 Radiculopathy, lumbar region: Secondary | ICD-10-CM | POA: Diagnosis not present

## 2023-12-31 DIAGNOSIS — M5416 Radiculopathy, lumbar region: Secondary | ICD-10-CM | POA: Diagnosis not present

## 2024-01-04 ENCOUNTER — Other Ambulatory Visit: Payer: Self-pay | Admitting: Family Medicine

## 2024-01-14 ENCOUNTER — Telehealth: Payer: Self-pay | Admitting: *Deleted

## 2024-01-14 DIAGNOSIS — E7800 Pure hypercholesterolemia, unspecified: Secondary | ICD-10-CM

## 2024-01-14 DIAGNOSIS — R7303 Prediabetes: Secondary | ICD-10-CM

## 2024-01-14 DIAGNOSIS — E559 Vitamin D deficiency, unspecified: Secondary | ICD-10-CM

## 2024-01-14 NOTE — Telephone Encounter (Signed)
-----   Message from Lovena Neighbours sent at 01/14/2024  8:53 AM EST ----- Regarding: Labs for Tuesday 2.25.25 Please put physical lab orders in future. Thank you, Denny Peon

## 2024-01-21 ENCOUNTER — Other Ambulatory Visit (INDEPENDENT_AMBULATORY_CARE_PROVIDER_SITE_OTHER): Payer: Medicare Other

## 2024-01-21 ENCOUNTER — Encounter: Payer: Self-pay | Admitting: Family Medicine

## 2024-01-21 DIAGNOSIS — E559 Vitamin D deficiency, unspecified: Secondary | ICD-10-CM | POA: Diagnosis not present

## 2024-01-21 DIAGNOSIS — E7800 Pure hypercholesterolemia, unspecified: Secondary | ICD-10-CM | POA: Diagnosis not present

## 2024-01-21 DIAGNOSIS — R7303 Prediabetes: Secondary | ICD-10-CM | POA: Diagnosis not present

## 2024-01-21 LAB — COMPREHENSIVE METABOLIC PANEL
ALT: 8 U/L (ref 0–35)
AST: 13 U/L (ref 0–37)
Albumin: 3.7 g/dL (ref 3.5–5.2)
Alkaline Phosphatase: 59 U/L (ref 39–117)
BUN: 15 mg/dL (ref 6–23)
CO2: 31 meq/L (ref 19–32)
Calcium: 9.2 mg/dL (ref 8.4–10.5)
Chloride: 101 meq/L (ref 96–112)
Creatinine, Ser: 0.84 mg/dL (ref 0.40–1.20)
GFR: 64.51 mL/min (ref >60.00)
Glucose, Bld: 85 mg/dL (ref 70–99)
Potassium: 3.3 meq/L — ABNORMAL LOW (ref 3.5–5.1)
Sodium: 141 meq/L (ref 135–145)
Total Bilirubin: 0.5 mg/dL (ref 0.2–1.2)
Total Protein: 7.9 g/dL (ref 6.0–8.3)

## 2024-01-21 LAB — LIPID PANEL
Cholesterol: 124 mg/dL (ref 0–200)
HDL: 38.4 mg/dL — ABNORMAL LOW (ref >39.00)
LDL Cholesterol: 68 mg/dL (ref 0–99)
NonHDL: 85.69
Total CHOL/HDL Ratio: 3
Triglycerides: 87 mg/dL (ref 0.0–149.0)
VLDL: 17.4 mg/dL (ref 0.0–40.0)

## 2024-01-21 LAB — HEMOGLOBIN A1C: Hgb A1c MFr Bld: 5.9 % (ref 4.6–6.5)

## 2024-01-21 LAB — VITAMIN D 25 HYDROXY (VIT D DEFICIENCY, FRACTURES): VITD: 70.32 ng/mL (ref 30.00–100.00)

## 2024-01-21 NOTE — Progress Notes (Signed)
 No critical labs need to be addressed urgently. We will discuss labs in detail at upcoming office visit.

## 2024-01-27 ENCOUNTER — Ambulatory Visit (INDEPENDENT_AMBULATORY_CARE_PROVIDER_SITE_OTHER): Payer: Medicare Other

## 2024-01-27 VITALS — Ht 61.5 in | Wt 170.0 lb

## 2024-01-27 DIAGNOSIS — Z0000 Encounter for general adult medical examination without abnormal findings: Secondary | ICD-10-CM

## 2024-01-27 NOTE — Progress Notes (Signed)
 Subjective:   Tracy Dixon is a 83 y.o. who presents for a Medicare Wellness preventive visit.  Visit Complete: Virtual I connected with  Roger Kill on 01/27/24 by a audio enabled telemedicine application and verified that I am speaking with the correct person using two identifiers.  Patient Location: Home  Provider Location: Office/Clinic  I discussed the limitations of evaluation and management by telemedicine. The patient expressed understanding and agreed to proceed.  Vital Signs: Because this visit was a virtual/telehealth visit, some criteria may be missing or patient reported. Any vitals not documented were not able to be obtained and vitals that have been documented are patient reported.  VideoDeclined- This patient declined Librarian, academic. Therefore the visit was completed with audio only.  AWV Questionnaire: Yes: Patient Medicare AWV questionnaire was completed by the patient on 01/21/24; I have confirmed that all information answered by patient is correct and no changes since this date.  Cardiac Risk Factors include: advanced age (>14men, >96 women);dyslipidemia;hypertension;obesity (BMI >30kg/m2)     Objective:    Today's Vitals   01/27/24 1308  Weight: 170 lb (77.1 kg)  Height: 5' 1.5" (1.562 m)   Body mass index is 31.6 kg/m.     01/27/2024    1:21 PM 11/03/2023   11:20 AM 07/17/2023    3:46 PM 01/21/2023    2:37 PM 09/26/2021    6:00 PM 12/03/2019    9:06 AM 11/27/2018   12:42 PM  Advanced Directives  Does Patient Have a Medical Advance Directive? No No No No No No No  Would patient like information on creating a medical advance directive?    No - Patient declined  No - Patient declined No - Patient declined    Current Medications (verified) Outpatient Encounter Medications as of 01/27/2024  Medication Sig   amLODipine (NORVASC) 5 MG tablet TAKE 1 TABLET BY MOUTH DAILY   atorvastatin (LIPITOR) 20 MG tablet TAKE 1 TABLET BY  MOUTH ONCE  DAILY   cetirizine (ZYRTEC) 10 MG tablet Take 10 mg by mouth daily as needed for allergies. Reported on 01/10/2016   cholecalciferol (VITAMIN D) 1000 UNITS tablet Take 1,000 Units by mouth daily.   cholestyramine (QUESTRAN) 4 g packet Take 1 packet (4 g total) by mouth daily as needed.   hydrochlorothiazide (MICROZIDE) 12.5 MG capsule TAKE 1 CAPSULE BY MOUTH DAILY   ibuprofen (ADVIL) 200 MG tablet Take 200 mg by mouth every 6 (six) hours as needed for mild pain.   omeprazole (PRILOSEC OTC) 20 MG tablet Take 20 mg by mouth daily.   No facility-administered encounter medications on file as of 01/27/2024.    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[facility])    followed by [facility], dx in [place]   Allergy    Arthritis    Asthma    Complication of anesthesia    stays sleepy   Diverticulosis of colon    Gallstones    Hyperlipidemia    Hypertension    Osteoarthritis    Symptomatic mammary hypertrophy    Past Surgical History:  Procedure Laterality Date   Bilateral tubal ligation  2005   BREAST REDUCTION SURGERY Bilateral 06/04/2018   Procedure: MAMMARY REDUCTION  (BREAST);  Surgeon: Peggye Form, DO;  Location: [facility];  Service: Plastics;  Laterality: Bilateral;   Carotid doppler  07/2006  Neg   CESAREAN SECTION  2005   CHOLECYSTECTOMY  1982   DECOMPRESSIVE LUMBAR LAMINECTOMY LEVEL 3  06/10/2013   Procedure: DECOMPRESSIVE LUMBAR LAMINECTOMY LEVEL 3;  Surgeon: Venita Lick, MD;  Location: [facility] OR;  Service: Orthopedics;;  lumbar three to five decompression   MRI brain  2007   acoustic neuroma (check yearly with MRI)   PFT's  12/2006   (-) FEV1/FVC 79%, FEV1 84%   REDUCTION MAMMAPLASTY Bilateral 05/2018   REPLACEMENT TOTAL KNEE BILATERAL  2005   TONSILLECTOMY  2005   TRIGGER FINGER RELEASE Left 09/05/2022    TUBAL LIGATION     Family History  Problem Relation Age of Onset   Cancer Mother        Lung CA   Stroke Mother        CVA   Cancer Father        Prostate   Heart disease Neg Hx    Breast cancer Neg Hx    Social History   Socioeconomic History   Marital status: Widowed    Spouse name: Not on file   Number of children: 4   Years of education: Not on file   Highest education level: Bachelor's degree (e.g., BA, AB, BS)  Occupational History   Occupation: Retired Herbalist - Insurance account manager: RETIRED  Tobacco Use   Smoking status: Former    Current packs/day: 0.00    Types: Cigarettes    Start date: 06/02/1958    Quit date: 06/02/1973    Years since quitting: 50.6   Smokeless tobacco: Never   Tobacco comments:    few cigs/day  Vaping Use   Vaping status: Never Used  Substance and Sexual Activity   Alcohol use: No   Drug use: No   Sexual activity: Not on file  Other Topics Concern   Not on file  Social History Narrative   Regular exercise:  No   Diet:  Fruits and veggies, rare FF, no water    No living will, no HCPOA.   Social Drivers of Corporate investment banker Strain: Low Risk  (01/27/2024)   Overall Financial Resource Strain (CARDIA)    Difficulty of Paying Living Expenses: Not hard at all  Food Insecurity: No Food Insecurity (01/27/2024)   Hunger Vital Sign    Worried About Running Out of Food in the Last Year: Never true    Ran Out of Food in the Last Year: Never true  Transportation Needs: No Transportation Needs (01/27/2024)   PRAPARE - Administrator, Civil Service (Medical): No    Lack of Transportation (Non-Medical): No  Physical Activity: Inactive (01/27/2024)   Exercise Vital Sign    Days of Exercise per Week: 0 days    Minutes of Exercise per Session: 0 min  Stress: No Stress Concern Present (01/27/2024)   [employer] of Occupational Health - Occupational Stress Questionnaire    Feeling of Stress : Not at all  Social  Connections: Moderately Isolated (01/27/2024)   Social Connection and Isolation Panel [NHANES]    Frequency of Communication with Friends and Family: Three times a week    Frequency of Social Gatherings with Friends and Family: Twice a week    Attends Religious Services: More than 4 times per year    Active Member of [employer] or Organizations: No    Attends Banker Meetings: Never    Marital Status: Widowed    Tobacco Counseling Counseling given: Not Answered  Tobacco comments: few cigs/day  Clinical Intake:  Pre-visit preparation completed: Yes  Pain : No/denies pain    BMI - recorded: 31.6 Nutritional Status: BMI > 30  Obese Nutritional Risks: None Diabetes: No  How often do you need to have someone help you when you read instructions, pamphlets, or other written materials from your doctor or pharmacy?: 1 - Never  Interpreter Needed?: No  Comments: granddaughter lives with pt temporary Information entered by :: B.Caelen Higinbotham,LPN   Activities of Daily Living     01/21/2024   10:46 AM  In your present state of health, do you have any difficulty performing the following activities:  Hearing? 0  Vision? 0  Difficulty concentrating or making decisions? 0  Walking or climbing stairs? 1  Dressing or bathing? 0  Doing errands, shopping? 0  Preparing Food and eating ? N  Using the Toilet? N  In the past six months, have you accidently leaked urine? N  Do you have problems with loss of bowel control? N  Managing your Medications? N  Managing your Finances? N  Housekeeping or managing your Housekeeping? N    Patient Care Team: Excell Seltzer, MD as PCP - General Hilaria Ota, MD as Referring Physician (Otolaryngology) Venita Lick, MD as Consulting Physician (Orthopedic Surgery) Dominica Severin, MD as Consulting Physician (Orthopedic Surgery) Durene Romans, MD as Consulting Physician (Orthopedic Surgery) Dillingham, Alena Bills, DO as Attending Physician  (Plastic Surgery) Galen Manila, MD as Referring Physician (Ophthalmology)  Indicate any recent Medical Services you may have received from other than [facility] providers in the past year (date may be approximate).     Assessment:   This is a routine wellness examination for [place].  Hearing/Vision screen Hearing Screening - Comments:: Pt says her hearing is &quot;so-so&quot;; hears well in rt ear but not in lft ear Vision Screening - Comments:: Pt says her vision is &quot;so-so&quot; good vision in rt eye: not so good on lft Dr Druscilla Brownie   Goals Addressed             This Visit's Progress    Patient Stated   On track    Starting 11/27/2018, I will continue to take medications as prescribed.      COMPLETED: Patient Stated   On track    12/03/2019, I will try to work on my mental health and have a more positive attitude.      Patient Stated       01/27/24- I would like to wake up every morning     Patient Stated       I would like to complete ACP this year       Depression Screen     01/27/2024    1:17 PM 07/10/2023   11:21 AM 04/11/2023   11:42 AM 01/21/2023    2:35 PM 01/19/2022    2:07 PM 12/29/2020   10:22 AM 12/13/2020    8:36 AM  PHQ 2/9 Scores  PHQ - 2 Score 0 0 0 0 1 0 0  PHQ- 9 Score  0 2        Fall Risk     01/21/2024   10:46 AM 07/10/2023   11:21 AM 04/11/2023   11:42 AM 01/21/2023    2:40 PM 01/19/2022    2:06 PM  Fall Risk   Falls in the past year? 0 0 0 0 0  Number falls in past yr:  0  0   Injury with Fall?  0  0   Risk for fall due to : No Fall Risks No Fall Risks  No Fall Risks   Follow up Education provided;Falls prevention discussed Falls evaluation completed  Falls prevention discussed;Falls evaluation completed     MEDICARE RISK AT HOME:  Medicare Risk at Home Any stairs in or around the home?: (Patient-Rptd) Yes If so, are there any without handrails?: (Patient-Rptd) No Home free of loose throw rugs in walkways, pet beds, electrical cords, etc?:  (Patient-Rptd) Yes Adequate lighting in your home to reduce risk of falls?: (Patient-Rptd) Yes Life alert?: (Patient-Rptd) No Use of a cane, walker or w/c?: (Patient-Rptd) Yes Grab bars in the bathroom?: (Patient-Rptd) Yes Shower chair or bench in shower?: (Patient-Rptd) Yes Elevated toilet seat or a handicapped toilet?: (Patient-Rptd) No  TIMED UP AND GO:  Was the test performed?  No  Cognitive Function: 6CIT completed    12/03/2019    9:10 AM 11/27/2018   12:31 PM 11/12/2017   10:11 AM  MMSE - Mini Mental State Exam  Orientation to time 5 5 5   Orientation to Place 5 5 5   Registration 3 3 3   Attention/ Calculation 5 0 0  Recall 3 3 3   Language- name 2 objects  0 0  Language- repeat 1 1 1   Language- follow 3 step command  3 2  Language- follow 3 step command-comments   unable to follow 1 step of 3 step command  Language- read & follow direction  0 0  Write a sentence  0 0  Copy design  0 0  Total score  20 19        01/27/2024    1:21 PM 01/21/2023    2:41 PM  6CIT Screen  What Year? 0 points 0 points  What month? 0 points 0 points  What time? 0 points 0 points  Count back from 20 0 points 0 points  Months in reverse 0 points 0 points  Repeat phrase 0 points 0 points  Total Score 0 points 0 points    Immunizations Immunization History  Administered Date(s) Administered   Fluad Quad(high Dose 65+) 07/30/2019, 09/03/2020, 07/25/2021, 08/21/2022   Fluad Trivalent(High Dose 65+) 09/25/2023   H1N1 01/12/2009   Influenza Split 08/28/2011, 08/28/2012   Influenza Whole 09/19/2007, 08/23/2008, 09/07/2009, 08/30/2010   Influenza,inj,Quad PF,6+ Mos 08/04/2013, 08/24/2014, 09/02/2015, 08/28/2016, 09/03/2017, 09/04/2018   Influenza,inj,quad, With Preservative 08/26/2019   Influenza-Unspecified 09/02/2017   PFIZER(Purple Top)SARS-COV-2 Vaccination 12/05/2019, 12/26/2019, 07/18/2020, 03/20/2021   Pneumococcal Conjugate-13 09/30/2014   Pneumococcal Polysaccharide-23 09/21/2008    Td 01/25/2007   Zoster Recombinant(Shingrix) 08/10/2019, 06/26/2020   Zoster, Live 08/20/2013    Screening Tests Health Maintenance  Topic Date Due   DTaP/Tdap/Td (2 - Tdap) 01/24/2017   COVID-19 Vaccine (5 - 2024-25 season) 07/28/2023   MAMMOGRAM  03/19/2024   Medicare Annual Wellness (AWV)  01/26/2025   DEXA SCAN  03/19/2028   Pneumonia Vaccine 90+ Years old  Completed   INFLUENZA VACCINE  Completed   Zoster Vaccines- Shingrix  Completed   HPV VACCINES  Aged Out   Hepatitis C Screening  Discontinued    Health Maintenance  Health Maintenance Due  Topic Date Due   DTaP/Tdap/Td (2 - Tdap) 01/24/2017   COVID-19 Vaccine (5 - 2024-25 season) 07/28/2023   Health Maintenance Items Addressed:none needed  Additional Screening:  Vision Screening: Recommended annual ophthalmology exams for early detection of glaucoma and other disorders of the eye.  Dental Screening: Recommended annual dental exams for proper oral hygiene  Community  Resource Referral / Chronic Care Management: CRR required this visit?  No   CCM required this visit?  No    Plan:     I have personally reviewed and noted the following in the patient's chart:   Medical and social history Use of alcohol, tobacco or illicit drugs  Current medications and supplements including opioid prescriptions. Patient is not currently taking opioid prescriptions. Functional ability and status Nutritional status Physical activity Advanced directives List of other physicians Hospitalizations, surgeries, and ER visits in previous 12 months Vitals Screenings to include cognitive, depression, and falls Referrals and appointments  In addition, I have reviewed and discussed with patient certain preventive protocols, quality metrics, and best practice recommendations. A written personalized care plan for preventive services as well as general preventive health recommendations were provided to patient.    Sue Lush, LPN   12/04/1476   After Visit Summary: (MyChart) Due to this being a telephonic visit, the after visit summary with patients personalized plan was offered to patient via MyChart   Notes: Nothing significant to report at this time.

## 2024-01-27 NOTE — Patient Instructions (Addendum)
 Ms. Berthold , Thank you for taking time to come for your Medicare Wellness Visit. I appreciate your ongoing commitment to your health goals. Please review the following plan we discussed and let me know if I can assist you in the future.   Referrals/Orders/Follow-Ups/Clinician Recommendations: none  This is a list of the screening recommended for you and due dates:  Health Maintenance  Topic Date Due   DTaP/Tdap/Td vaccine (2 - Tdap) 01/24/2017   COVID-19 Vaccine (5 - 2024-25 season) 07/28/2023   Mammogram  03/19/2024   Medicare Annual Wellness Visit  01/26/2025   DEXA scan (bone density measurement)  03/19/2028   Pneumonia Vaccine  Completed   Flu Shot  Completed   Zoster (Shingles) Vaccine  Completed   HPV Vaccine  Aged Out   Hepatitis C Screening  Discontinued    Advanced directives: (Declined) Advance directive discussed with you today. Even though you declined this today, please call our office should you change your mind, and we can give you the proper paperwork for you to fill out.  Next Medicare Annual Wellness Visit scheduled for next year: Yes 01/28/24 @ 1:40pm televisit

## 2024-01-28 ENCOUNTER — Encounter: Payer: Self-pay | Admitting: Family Medicine

## 2024-01-28 ENCOUNTER — Ambulatory Visit (INDEPENDENT_AMBULATORY_CARE_PROVIDER_SITE_OTHER): Payer: Medicare Other | Admitting: Family Medicine

## 2024-01-28 VITALS — BP 134/78 | HR 94 | Temp 98.1°F | Ht 60.5 in | Wt 169.0 lb

## 2024-01-28 DIAGNOSIS — M7989 Other specified soft tissue disorders: Secondary | ICD-10-CM

## 2024-01-28 DIAGNOSIS — E559 Vitamin D deficiency, unspecified: Secondary | ICD-10-CM

## 2024-01-28 DIAGNOSIS — R7303 Prediabetes: Secondary | ICD-10-CM | POA: Diagnosis not present

## 2024-01-28 DIAGNOSIS — I10 Essential (primary) hypertension: Secondary | ICD-10-CM | POA: Diagnosis not present

## 2024-01-28 DIAGNOSIS — K219 Gastro-esophageal reflux disease without esophagitis: Secondary | ICD-10-CM | POA: Diagnosis not present

## 2024-01-28 DIAGNOSIS — M79662 Pain in left lower leg: Secondary | ICD-10-CM | POA: Diagnosis not present

## 2024-01-28 DIAGNOSIS — D333 Benign neoplasm of cranial nerves: Secondary | ICD-10-CM | POA: Diagnosis not present

## 2024-01-28 DIAGNOSIS — E7800 Pure hypercholesterolemia, unspecified: Secondary | ICD-10-CM

## 2024-01-28 DIAGNOSIS — K529 Noninfective gastroenteritis and colitis, unspecified: Secondary | ICD-10-CM

## 2024-01-28 MED ORDER — ALBUTEROL SULFATE HFA 108 (90 BASE) MCG/ACT IN AERS: 2.0000 | INHALATION_SPRAY | Freq: Four times a day (QID) | RESPIRATORY_TRACT | 0 refills | Status: AC | PRN

## 2024-01-28 NOTE — Assessment & Plan Note (Signed)
Stable, chronic.  Continue current medication.    

## 2024-01-28 NOTE — Assessment & Plan Note (Signed)
Stable, chronic.  Continue current medication. ? ? ?Atorvastatin 20 mg daily ?

## 2024-01-28 NOTE — Assessment & Plan Note (Addendum)
 Chronic,   She has tried cholestyramine which helps but she does not like the taste.  It also tends to make her not have a bowel movement for several days. She will consider following up with GI for further recommendations.

## 2024-01-28 NOTE — Assessment & Plan Note (Addendum)
 Chronic,  Try wean off omeprazole over the next few weeks.  Pepcid AC can use twice daily as needed.

## 2024-01-28 NOTE — Progress Notes (Signed)
 Patient ID: Tracy Dixon, female    DOB: 10/03/1941, 83 y.o.   MRN: [id number]  This visit was conducted in person.  BP 134/78 (BP Location: Left Arm, Patient Position: Sitting, Cuff Size: Large)   Pulse 94   Temp 98.1 F (36.7 C) (Temporal)   Ht 5' 0.5" (1.537 m)   Wt 169 lb (76.7 kg)   SpO2 99%   BMI 32.46 kg/m    CC: Chief Complaint  Patient presents with   Annual Exam    Part 2  ([facility] 01/27/24)    Subjective:   HPI: Tracy Dixon is a 83 y.o. female presenting on 01/28/2024 for Annual Exam (Part 2  (M[facility] 01/27/24))  The patient presents for review of chronic health problems. He/She also has the following acute concerns today: none  The patient saw a LPN or RN for medicare wellness visit.01/27/2024  Prevention and wellness was reviewed in detail. Note reviewed and important notes copied below.  Flowsheet Row Clinical Support from 01/27/2024 in [facility]  PHQ-2 Total Score 0       Hypertension:   Good control on amlodipine  , hydrochlorothiazide 12.5 mg daily BP Readings from Last 3 Encounters:  01/28/24 134/78  12/10/23 (!) 144/83  11/03/23 137/80  Using medication without problems or lightheadedness:  Chest pain with exertion: none Edema:yes due to chronic venous insufficiency.. using compression hose Short of breath: none Average home BPs: Other issues:  Chronic diarrhea... she has started cholestyramine .Marland Kitchen It helps but it tastes horrible. Wt Readings from Last 3 Encounters:  01/28/24 169 lb (76.7 kg)  01/27/24 170 lb (77.1 kg)  12/05/23 170 lb (77.1 kg)      Doing home PT, may try to get back to [facility] Readings from Last 3 Encounters:  01/28/24 169 lb (76.7 kg)  01/27/24 170 lb (77.1 kg)  12/05/23 170 lb (77.1 kg)   Body mass index is 32.46 kg/m.   Dr Allegra Lai: GI  chronic diarrhea.. this continues to cause her a lot of problems. She has started cholestyramine .Marland Kitchen It helps but it tastes horrible.   Has  frequent soft stool.    Stool in past showed EColi several times.. treated with antibiotic several times.  Had negative H pylori, pancreatic testing, celiac panel.  Elevated Cholesterol: Well-controlled with LDL at goal on atorvastatin 20 mg daily. Lab Results  Component Value Date   CHOL 124 01/21/2024   HDL 38.40 (L) 01/21/2024   LDLCALC 68 01/21/2024   TRIG 87.0 01/21/2024   CHOLHDL 3 01/21/2024  Using medications without problems: none Muscle aches:  none Diet compliance:  moderate Exercise: minimal Other complaints:   Wt Readings from Last 3 Encounters:  01/28/24 169 lb (76.7 kg)  01/27/24 170 lb (77.1 kg)  12/05/23 170 lb (77.1 kg)    Prediabetes  Lab Results  Component Value Date   HGBA1C 5.9 01/21/2024   Vitamin D deficiency: Resolved on supplementation.   Chronic back pain:  Followed by .. s/p ESI.Marland Kitchen  [facility] Does not want to repeats.  Doing home PT. SE to gabapentin,   Relevant past medical, surgical, family and social history reviewed and updated as indicated. Interim medical history since our last visit reviewed. Allergies and medications reviewed and updated. Outpatient Medications Prior to Visit  Medication Sig Dispense Refill   amLODipine (NORVASC) 5 MG tablet TAKE 1 TABLET BY MOUTH DAILY 90 tablet 0   atorvastatin (LIPITOR) 20 MG tablet TAKE  1 TABLET BY MOUTH ONCE  DAILY 90 tablet 0   cetirizine (ZYRTEC) 10 MG tablet Take 10 mg by mouth daily as needed for allergies. Reported on 01/10/2016     cholecalciferol (VITAMIN D) 1000 UNITS tablet Take 1,000 Units by mouth daily.     cholestyramine (QUESTRAN) 4 g packet Take 1 packet (4 g total) by mouth daily as needed. 60 each 0   hydrochlorothiazide (MICROZIDE) 12.5 MG capsule TAKE 1 CAPSULE BY MOUTH DAILY 90 capsule 0   omeprazole (PRILOSEC OTC) 20 MG tablet Take 20 mg by mouth daily.     ibuprofen (ADVIL) 200 MG tablet Take 200 mg by mouth every 6 (six) hours as needed for mild pain.     No  facility-administered medications prior to visit.     Per HPI unless specifically indicated in ROS section below Review of Systems  Constitutional:  Negative for fatigue and fever.  HENT:  Negative for congestion.   Eyes:  Negative for pain.  Respiratory:  Negative for cough and shortness of breath.   Cardiovascular:  Negative for chest pain, palpitations and leg swelling.  Gastrointestinal:  Negative for abdominal pain.  Genitourinary:  Negative for dysuria and vaginal bleeding.  Musculoskeletal:  Negative for back pain.  Neurological:  Negative for syncope, light-headedness and headaches.  Psychiatric/Behavioral:  Negative for dysphoric mood.    Objective:  BP 134/78 (BP Location: Left Arm, Patient Position: Sitting, Cuff Size: Large)   Pulse 94   Temp 98.1 F (36.7 C) (Temporal)   Ht 5' 0.5" (1.537 m)   Wt 169 lb (76.7 kg)   SpO2 99%   BMI 32.46 kg/m   Wt Readings from Last 3 Encounters:  01/28/24 169 lb (76.7 kg)  01/27/24 170 lb (77.1 kg)  12/05/23 170 lb (77.1 kg)      Physical Exam Constitutional:      General: She is not in acute distress.    Appearance: Normal appearance. She is well-developed. She is obese. She is not ill-appearing or toxic-appearing.  HENT:     Head: Normocephalic.     Right Ear: Hearing, tympanic membrane, ear canal and external ear normal. Tympanic membrane is not erythematous, retracted or bulging.     Left Ear: Hearing, tympanic membrane, ear canal and external ear normal. Tympanic membrane is not erythematous, retracted or bulging.     Nose: No mucosal edema or rhinorrhea.     Right Sinus: No maxillary sinus tenderness or frontal sinus tenderness.     Left Sinus: No maxillary sinus tenderness or frontal sinus tenderness.     Mouth/Throat:     Pharynx: Uvula midline.  Eyes:     General: Lids are normal. Lids are everted, no foreign bodies appreciated.     Conjunctiva/sclera: Conjunctivae normal.     Pupils: Pupils are equal, round, and  reactive to light.  Neck:     Thyroid: No thyroid mass or thyromegaly.     Vascular: No carotid bruit.     Trachea: Trachea normal.  Cardiovascular:     Rate and Rhythm: Normal rate and regular rhythm.     Pulses: Normal pulses.     Heart sounds: Normal heart sounds, S1 normal and S2 normal. No murmur heard.    No friction rub. No gallop.  Pulmonary:     Effort: Pulmonary effort is normal. No tachypnea or respiratory distress.     Breath sounds: Normal breath sounds. No decreased breath sounds, wheezing, rhonchi or rales.  Abdominal:  General: Bowel sounds are normal.     Palpations: Abdomen is soft.     Tenderness: There is no abdominal tenderness.  Musculoskeletal:     Cervical back: Normal range of motion and neck supple.     Right lower leg: No edema.     Left lower leg: 2+ Edema present.  Skin:    General: Skin is warm and dry.     Findings: No rash.  Neurological:     Mental Status: She is alert.  Psychiatric:        Mood and Affect: Mood is not anxious or depressed.        Speech: Speech normal.        Behavior: Behavior normal. Behavior is cooperative.        Thought Content: Thought content normal.        Judgment: Judgment normal.       Results for orders placed or performed in visit on 01/21/24  VITAMIN D 25 Hydroxy (Vit-D Deficiency, Fractures)   Collection Time: 01/21/24  8:48 AM  Result Value Ref Range   VITD 70.32 30.00 - 100.00 ng/mL  Lipid panel   Collection Time: 01/21/24  8:48 AM  Result Value Ref Range   Cholesterol 124 0 - 200 mg/dL   Triglycerides 29.5 0.0 - 149.0 mg/dL   HDL 62.13 (L) >08.65 mg/dL   VLDL 78.4 0.0 - 69.6 mg/dL   LDL Cholesterol 68 0 - 99 mg/dL   Total CHOL/HDL Ratio 3    NonHDL 85.69   Hemoglobin A1c   Collection Time: 01/21/24  8:48 AM  Result Value Ref Range   Hgb A1c MFr Bld 5.9 4.6 - 6.5 %  Comprehensive metabolic panel   Collection Time: 01/21/24  8:48 AM  Result Value Ref Range   Sodium 141 135 - 145 mEq/L    Potassium 3.3 (L) 3.5 - 5.1 mEq/L   Chloride 101 96 - 112 mEq/L   CO2 31 19 - 32 mEq/L   Glucose, Bld 85 70 - 99 mg/dL   BUN 15 6 - 23 mg/dL   Creatinine, Ser 2.95 0.40 - 1.20 mg/dL   Total Bilirubin 0.5 0.2 - 1.2 mg/dL   Alkaline Phosphatase 59 39 - 117 U/L   AST 13 0 - 37 U/L   ALT 8 0 - 35 U/L   Total Protein 7.9 6.0 - 8.3 g/dL   Albumin 3.7 3.5 - 5.2 g/dL   GFR 28.41 >32.44 mL/min   Calcium 9.2 8.4 - 10.5 mg/dL    This visit occurred during the SARS-CoV-2 public health emergency.  Safety protocols were in place, including screening questions prior to the visit, additional usage of staff PPE, and extensive cleaning of exam room while observing appropriate contact time as indicated for disinfecting solutions.   COVID 19 screen:  No recent travel or known exposure to COVID19 The patient denies respiratory symptoms of COVID 19 at this time. The importance of social distancing was discussed today.   Assessment and Plan   The patient's preventative maintenance and recommended screening tests for an annual wellness exam were reviewed in full today. Brought up to date unless services declined.  Counselled on the importance of diet, exercise, and its role in overall health and mortality. The patient's FH and SH was reviewed, including their home life, tobacco status, and drug and alcohol status.   Vaccines: uptodate, refused tdap. Has gotten COVID19 x 4, Shingrix, RSV. Pap/DVE:  No pap/no DVE indicated, No family history of uterine  or ovarian cancer. No vaginal bleeding, no discharge. Mammo:   02/2023. 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 does    Problem List Items Addressed This Visit     Chronic diarrhea    Chronic,   She has tried cholestyramine which helps but she does not like the taste.  It  also tends to make her not have a bowel movement for several days. She will consider following up with GI for further recommendations.       Essential hypertension, benign - Primary (Chronic)   Stable, chronic.  Continue current medication.   Amlodipine 5 mg daily  HCTZ 25 mg daily      Gastroesophageal reflux disease   Chronic,  Try wean off omeprazole over the next few weeks.  Pepcid AC can use twice daily as needed.       High cholesterol (Chronic)   Stable, chronic.  Continue current medication.   Atorvastatin 20 mg daily      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HCC)    Followed by ENT in the past but has not seen in several years.  She is not a candidate for surgery and is having no worsening of already poor hearing in left ear.  She is not interested in following up with ENT.      Pain and swelling of left lower leg    Chronic... S/P sclerotherapy. Likely secondary to venous insufficiency on the left although does have some focal pain at ankle.  Possible arthritis in the ankle... She will return if return to vascular doctor is not helpful.  We can consider looking into other options as cause for left ankle/leg pain       Prediabetes (Chronic)   Stable, chronic.   Good control with diet.         Vitamin D deficiency (Chronic)   Stable, chronic.  Continue current medication.           Kerby Nora, MD

## 2024-01-28 NOTE — Assessment & Plan Note (Addendum)
 Followed by ENT in the past but has not seen in several years.  She is not a candidate for surgery and is having no worsening of already poor hearing in left ear.  She is not interested in following up with ENT.

## 2024-01-28 NOTE — Patient Instructions (Addendum)
 Try wean off omeprazole over the next few weeks.  Pepcid AC can use twice daily as needed.  Try to increase potassium in diet... start multivitamin daily.  Follow up on chronic diarrhea with Gi.  Consider following up with vascular for left leg pain.

## 2024-01-28 NOTE — Assessment & Plan Note (Addendum)
 Chronic... S/P sclerotherapy. Likely secondary to venous insufficiency on the left although does have some focal pain at ankle.  Possible arthritis in the ankle... She will return if return to vascular doctor is not helpful.  We can consider looking into other options as cause for left ankle/leg pain

## 2024-01-28 NOTE — Assessment & Plan Note (Signed)
Stable, chronic.  Continue current medication.   Amlodipine 5 mg daily  HCTZ 25 mg daily 

## 2024-01-28 NOTE — Assessment & Plan Note (Signed)
Stable, chronic.   Good control with diet.    

## 2024-02-10 ENCOUNTER — Ambulatory Visit (INDEPENDENT_AMBULATORY_CARE_PROVIDER_SITE_OTHER): Payer: Medicare Other | Admitting: Podiatry

## 2024-02-10 ENCOUNTER — Encounter: Payer: Self-pay | Admitting: Podiatry

## 2024-02-10 VITALS — Ht 60.5 in | Wt 169.0 lb

## 2024-02-10 DIAGNOSIS — M79675 Pain in left toe(s): Secondary | ICD-10-CM

## 2024-02-10 DIAGNOSIS — M79672 Pain in left foot: Secondary | ICD-10-CM

## 2024-02-10 DIAGNOSIS — Q828 Other specified congenital malformations of skin: Secondary | ICD-10-CM | POA: Diagnosis not present

## 2024-02-10 DIAGNOSIS — M79671 Pain in right foot: Secondary | ICD-10-CM | POA: Diagnosis not present

## 2024-02-10 DIAGNOSIS — M79674 Pain in right toe(s): Secondary | ICD-10-CM | POA: Diagnosis not present

## 2024-02-10 DIAGNOSIS — B351 Tinea unguium: Secondary | ICD-10-CM | POA: Diagnosis not present

## 2024-02-16 ENCOUNTER — Encounter: Payer: Self-pay | Admitting: Podiatry

## 2024-02-16 NOTE — Progress Notes (Signed)
  Subjective:  Patient ID: Tracy Dixon, female    DOB: 1941-01-27,  MRN: [id number]  MIREILLE LACOMBE presents to clinic today for painful porokeratotic lesion(s) b/l feet and painful mycotic toenails that limit ambulation. Painful toenails interfere with ambulation. Aggravating factors include wearing enclosed shoe gear. Pain is relieved with periodic professional debridement. Painful porokeratotic lesions are aggravated when weightbearing with and without shoegear. Pain is relieved with periodic professional debridement.  Chief Complaint  Patient presents with   Nail Problem    Pt is here for [facility] PCP is Dr Ermalene Searing and LOV was earlier this month.   New problem(s): None.   PCP is Excell Seltzer, MD.  Allergies  Allergen Reactions   Contrast Media [Iodinated Contrast Media] Hives   Crab Extract     Other reaction(s): Not available   Crab [Shellfish Allergy] Hives    Review of Systems: Negative except as noted in the HPI.  Objective: No changes noted in today's physical examination. There were no vitals filed for this visit. JERZY CROTTEAU is a pleasant 83 y.o. female WD, WN in NAD. AAO x 3.  Vascular Examination: Capillary refill time to digits immediate b/l. Palpable DP pulse(s) b/l LE. Palpable PT pulse(s) b/l LE. No pain with calf compression b/l. Lower extremity skin temperature gradient within normal limits. Trace edema noted BLE. Varicosities present b/l. No cyanosis or clubbing noted b/l LE.  Neurological Examination: Sensation grossly intact b/l with 10 gram monofilament. Vibratory sensation intact b/l.  Dermatological Examination: Pedal skin with normal turgor, texture and tone b/l. No open wounds nor interdigital macerations noted. Toenails 1-5 b/l thick, discolored, elongated with subungual debris and pain on dorsal palpation.  Porokeratotic lesion(s) plantar midfoot x 2, right great toe, and submet head 2 right foot. No erythema, no edema, no drainage, no  fluctuance.  Musculoskeletal Examination: Muscle strength 5/5 to b/l LE.  No pain, crepitus noted b/l. No gross pedal deformities. Patient ambulates independently without assistive aids.   Radiographs: None  Assessment/Plan: 1. Pain due to onychomycosis of toenails of both feet   2. Porokeratosis   3. Foot pain, bilateral     Medicare ABN on file. Patient was evaluated and treated. All patient's and/or POA's questions/concerns addressed on today's visit. Toenails 1-5 debrided in length and girth without incident. Porokeratotic lesion(s)  x 2 plantar midfoot RLE, R hallux, and submet head 2 right foot pared with sharp debridement without incident. Continue soft, supportive shoe gear daily. Report any pedal injuries to medical professional. Call office if there are any questions/concerns. Return in about 9 weeks (around 04/13/2024).  Freddie Breech, DPM      [place] LOCATION: [address] [place] [address]                   Office [phone]   [facility] LOCATION: [address] [place], [place] [address] Office [phone]

## 2024-02-18 ENCOUNTER — Other Ambulatory Visit: Payer: Self-pay | Admitting: Family Medicine

## 2024-02-19 DIAGNOSIS — M48061 Spinal stenosis, lumbar region without neurogenic claudication: Secondary | ICD-10-CM | POA: Diagnosis not present

## 2024-02-19 DIAGNOSIS — M5416 Radiculopathy, lumbar region: Secondary | ICD-10-CM | POA: Diagnosis not present

## 2024-03-03 ENCOUNTER — Other Ambulatory Visit: Payer: Self-pay | Admitting: Family Medicine

## 2024-03-03 DIAGNOSIS — Z1231 Encounter for screening mammogram for malignant neoplasm of breast: Secondary | ICD-10-CM

## 2024-03-09 ENCOUNTER — Other Ambulatory Visit: Payer: Self-pay | Admitting: Family Medicine

## 2024-03-10 ENCOUNTER — Other Ambulatory Visit (INDEPENDENT_AMBULATORY_CARE_PROVIDER_SITE_OTHER): Payer: Self-pay | Admitting: Nurse Practitioner

## 2024-03-10 DIAGNOSIS — I83819 Varicose veins of unspecified lower extremities with pain: Secondary | ICD-10-CM

## 2024-03-11 DIAGNOSIS — M5416 Radiculopathy, lumbar region: Secondary | ICD-10-CM | POA: Diagnosis not present

## 2024-03-16 ENCOUNTER — Ambulatory Visit (INDEPENDENT_AMBULATORY_CARE_PROVIDER_SITE_OTHER)

## 2024-03-16 ENCOUNTER — Encounter (INDEPENDENT_AMBULATORY_CARE_PROVIDER_SITE_OTHER): Payer: Self-pay | Admitting: Vascular Surgery

## 2024-03-16 ENCOUNTER — Ambulatory Visit (INDEPENDENT_AMBULATORY_CARE_PROVIDER_SITE_OTHER): Admitting: Vascular Surgery

## 2024-03-16 VITALS — BP 144/83 | HR 84 | Resp 18 | Ht 61.0 in | Wt 168.0 lb

## 2024-03-16 DIAGNOSIS — I10 Essential (primary) hypertension: Secondary | ICD-10-CM | POA: Diagnosis not present

## 2024-03-16 DIAGNOSIS — I83819 Varicose veins of unspecified lower extremities with pain: Secondary | ICD-10-CM | POA: Diagnosis not present

## 2024-03-16 DIAGNOSIS — I872 Venous insufficiency (chronic) (peripheral): Secondary | ICD-10-CM | POA: Diagnosis not present

## 2024-03-16 DIAGNOSIS — J4520 Mild intermittent asthma, uncomplicated: Secondary | ICD-10-CM

## 2024-03-16 DIAGNOSIS — E7800 Pure hypercholesterolemia, unspecified: Secondary | ICD-10-CM

## 2024-03-16 NOTE — Progress Notes (Signed)
 MRN : [id number]  KATYANA TROLINGER is a 83 y.o. (March 23, 1941) female who presents with chief complaint of legs hurt and swell.  History of Present Illness:  The patient returns to the office for followup status post laser ablation of the left great saphenous vein on 03/16/2021.  She returns today secondary to increased swelling as well as an increase in pain in her left lower extremity.  This has been a progression over the past year or so.  She denies acute or abrupt change.  The patient continues to wear graduated compression stockings on a daily basis but these are not eliminating the pain and discomfort.    The patient has otherwise done well and there have been no complications related to the laser procedure or interval changes in the patient's overall    Post laser ultrasound showed successful ablation of the left great saphenous vein    Duplex ultrasound of the left lower extremity venous system obtained today demonstrates normal deep venous system bilaterally.  There is no evidence of reflux in the right superficial system.  On the left there is successful ablation of the great saphenous vein.  There is a varicose vein in the proximal thigh which does demonstrate reflux.  No outpatient medications have been marked as taking for the 03/16/24 encounter (Appointment) with Prescilla Brod, Ninette Basque, MD.    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[facility])    followed by [facility], dx in [place]    Allergy    Arthritis    Asthma    Complication of anesthesia    stays sleepy   Diverticulosis of colon    Gallstones    Hyperlipidemia    Hypertension    Osteoarthritis    Symptomatic mammary hypertrophy     Past Surgical History:  Procedure Laterality Date   Bilateral tubal ligation  2005   BREAST REDUCTION SURGERY Bilateral 06/04/2018   Procedure: MAMMARY REDUCTION  (BREAST);  Surgeon: Thornell Flirt, DO;  Location: [facility];  Service:  Plastics;  Laterality: Bilateral;   Carotid doppler  07/2006   Neg   CESAREAN SECTION  2005   CHOLECYSTECTOMY  1982   DECOMPRESSIVE LUMBAR LAMINECTOMY LEVEL 3  06/10/2013   Procedure: DECOMPRESSIVE LUMBAR LAMINECTOMY LEVEL 3;  Surgeon: Mort Ards, MD;  Location: [facility] OR;  Service: Orthopedics;;  lumbar three to five decompression   MRI brain  2007   acoustic neuroma (check yearly with MRI)   PFT's  12/2006   (-) FEV1/FVC 79%, FEV1 84%   REDUCTION MAMMAPLASTY Bilateral 05/2018   REPLACEMENT TOTAL KNEE BILATERAL  2005   TONSILLECTOMY  2005   TRIGGER FINGER RELEASE Left 09/05/2022   TUBAL LIGATION      Social History Social History   Tobacco Use   Smoking status: Former    Current packs/day: 0.00    Types: Cigarettes    Start date: 06/02/1958    Quit date: 06/02/1973    Years since quitting: 50.8   Smokeless tobacco: Never   Tobacco comments:    few cigs/day  Vaping Use   Vaping status: Never Used  Substance Use Topics   Alcohol use: No   Drug use: No    Family History Family History  Problem Relation Age of Onset   Cancer Mother        Lung CA   Stroke Mother        CVA   Cancer Father  Prostate   Heart disease Neg Hx    Breast cancer Neg Hx     Allergies  Allergen Reactions   Contrast Media [Iodinated Contrast Media] Hives   Crab Extract     Other reaction(s): Not available   Crab [Shellfish Allergy] Hives     REVIEW OF SYSTEMS (Negative unless checked)  Constitutional: [] Weight loss  [] Fever  [] Chills Cardiac: [] Chest pain   [] Chest pressure   [] Palpitations   [] Shortness of breath when laying flat   [] Shortness of breath with exertion. Vascular:  [] Pain in legs with walking   [x] Pain in legs at rest  [] History of DVT   [] Phlebitis   [x] Swelling in legs   [x] Varicose veins   [] Non-healing ulcers Pulmonary:   [] Uses home oxygen   [] Productive cough   [] Hemoptysis   [] Wheeze  [] COPD   [] Asthma Neurologic:  [] Dizziness   [] Seizures   [] History of  stroke   [] History of TIA  [] Aphasia   [] Vissual changes   [x] Weakness or numbness in arm   [x] Weakness or numbness in leg Musculoskeletal:   [] Joint swelling   [x] Joint pain   [] Low back pain Hematologic:  [] Easy bruising  [] Easy bleeding   [] Hypercoagulable state   [] Anemic Gastrointestinal:  [] Diarrhea   [] Vomiting  [x] Gastroesophageal reflux/heartburn   [] Difficulty swallowing. Genitourinary:  [] Chronic kidney disease   [] Difficult urination  [] Frequent urination   [] Blood in urine Skin:  [] Rashes   [] Ulcers  Psychological:  [] History of anxiety   []  History of major depression.  Physical Examination  There were no vitals filed for this visit. There is no height or weight on file to calculate BMI. Gen: WD/WN, NAD Head: [place]/AT, No temporalis wasting.  Ear/Nose/Throat: Hearing grossly intact, nares w/o erythema or drainage, pinna without lesions Eyes: PER, EOMI, sclera nonicteric.  Neck: Supple, no gross masses.  No JVD.  Pulmonary:  Good air movement, no audible wheezing, no use of accessory muscles.  Cardiac: RRR, precordium not hyperdynamic. Vascular:  scattered varicosities present bilaterally.  Mild venous stasis changes to the legs bilaterally.  2+ soft pitting edema. CEAP C4sEpAsPr   Vessel Right Left  Radial Palpable Palpable  Gastrointestinal: soft, non-distended. No guarding/no peritoneal signs.  Musculoskeletal: M/S 5/5 throughout.  No deformity.  Neurologic: CN 2-12 intact. Pain and light touch intact in extremities.  Symmetrical.  Speech is fluent. Motor exam as listed above. Psychiatric: Judgment intact, Mood & affect appropriate for pt's clinical situation. Dermatologic: Venous rashes no ulcers noted.  No changes consistent with cellulitis. Lymph : No lichenification or skin changes of chronic lymphedema.  CBC Lab Results  Component Value Date   WBC 7.2 04/20/2019   HGB 13.6 04/20/2019   HCT 42.6 04/20/2019   MCV 78.5 (L) 04/20/2019   PLT 231 04/20/2019     BMET    Component Value Date/Time   NA 141 01/21/2024 0848   K 3.3 (L) 01/21/2024 0848   CL 101 01/21/2024 0848   CO2 31 01/21/2024 0848   GLUCOSE 85 01/21/2024 0848   BUN 15 01/21/2024 0848   CREATININE 0.84 01/21/2024 0848   CALCIUM  9.2 01/21/2024 0848   GFRNONAA >60 04/20/2019 1054   GFRAA >60 04/20/2019 1054   CrCl cannot be calculated (Patient's most recent lab result is older than the maximum 21 days allowed.).  COAG Lab Results  Component Value Date   INR 1.1 ratio (H) 03/30/2009    Radiology No results found.   Assessment/Plan 1. Varicose veins with pain (Primary) Recommend:  The  patient is complaining of varicose veins.    I have had a long discussion with the patient regarding  varicose veins and why they cause symptoms.  Patient will begin wearing graduated compression stockings on a daily basis, beginning first thing in the morning and removing them in the evening. The patient is instructed specifically not to sleep in the stockings.    The patient  will also begin using over-the-counter analgesics such as Motrin  600 mg po TID to help control the symptoms as needed.    In addition, behavioral modification including elevation during the day will be initiated, utilizing a recliner was recommended.  The patient is also instructed to continue exercising such as walking 4-5 times per week.  At this time the patient wishes to continue conservative therapy and is not interested in more invasive treatments such as laser ablation and sclerotherapy.  2. Chronic venous insufficiency Recommend:  No surgery or intervention at this point in time.   The Patient is CEAP C4sEpAsPr.  The patient has been wearing compression for more than 12 weeks with no or little benefit.  The patient has been exercising daily for more than 12 weeks. The patient has been elevating and taking OTC pain medications for more than 12 weeks.  None of these have have eliminated the pain  related to the lymphedema or the discomfort regarding excessive swelling and venous congestion.    I have reviewed my discussion with the patient regarding lymphedema and why it  causes symptoms.  Patient will continue wearing graduated compression on a daily basis. The patient should put the compression on first thing in the morning and removing them in the evening. The patient should not sleep in the compression.   In addition, behavioral modification throughout the day will be continued.  This will include frequent elevation (such as in a recliner), use of over the counter pain medications as needed and exercise such as walking.  The systemic causes for chronic edema such as liver, kidney and cardiac etiologies do not appear to have significant changed over the past year.    The patient has chronic , severe lymphedema with hyperpigmentation of the skin and has done MLD, skin care, medication, diet, exercise, elevation and compression for 4 weeks with no improvement,  I am recommending a lymphedema pump.  The patient still has stage 3 lymphedema and therefore, I believe that a lymph pump is needed to improve the control of the patient's lymphedema and improve the quality of life.  Additionally, a lymph pump is warranted because it will reduce the risk of cellulitis and ulceration in the future.  Patient should follow-up in six months   3. Essential hypertension, benign Continue antihypertensive medications as already ordered, these medications have been reviewed and there are no changes at this time.  4. Mild intermittent asthma without complication Continue pulmonary medications and aerosols as already ordered, these medications have been reviewed and there are no changes at this time.   5. High cholesterol Continue statin as ordered and reviewed, no changes at this time    Devon Fogo, MD  03/16/2024 3:31 PM

## 2024-03-20 ENCOUNTER — Ambulatory Visit
Admission: RE | Admit: 2024-03-20 | Discharge: 2024-03-20 | Disposition: A | Discharge status: Home / Self Care | Source: Ambulatory Visit | Attending: Family Medicine | Admitting: Family Medicine

## 2024-03-20 DIAGNOSIS — Z1231 Encounter for screening mammogram for malignant neoplasm of breast: Secondary | ICD-10-CM | POA: Diagnosis not present

## 2024-04-02 ENCOUNTER — Other Ambulatory Visit: Payer: Self-pay

## 2024-04-02 DIAGNOSIS — M5416 Radiculopathy, lumbar region: Secondary | ICD-10-CM | POA: Diagnosis not present

## 2024-04-02 DIAGNOSIS — M48061 Spinal stenosis, lumbar region without neurogenic claudication: Secondary | ICD-10-CM | POA: Diagnosis not present

## 2024-04-06 ENCOUNTER — Ambulatory Visit: Admitting: Gastroenterology

## 2024-04-08 ENCOUNTER — Ambulatory Visit (INDEPENDENT_AMBULATORY_CARE_PROVIDER_SITE_OTHER): Admitting: Gastroenterology

## 2024-04-08 VITALS — BP 138/80 | HR 112 | Temp 98.0°F | Wt 169.0 lb

## 2024-04-08 DIAGNOSIS — K219 Gastro-esophageal reflux disease without esophagitis: Secondary | ICD-10-CM

## 2024-04-08 DIAGNOSIS — R195 Other fecal abnormalities: Secondary | ICD-10-CM

## 2024-04-08 DIAGNOSIS — K529 Noninfective gastroenteritis and colitis, unspecified: Secondary | ICD-10-CM | POA: Diagnosis not present

## 2024-04-08 MED ORDER — COLESTIPOL HCL 1 G PO TABS
1.0000 g | ORAL_TABLET | Freq: Two times a day (BID) | ORAL | 0 refills | Status: AC
Start: 2024-04-08 — End: ?

## 2024-04-08 NOTE — Progress Notes (Signed)
 Tracy Oz, MD [address]  [address]  [place], [place] [address]  Main: [phone]  Fax: [phone]    Gastroenterology Consultation  Referring Provider:     Judithann Novas, MD Primary Care Physician:  Judithann Novas, MD Primary Gastroenterologist:  Dr. Karma Dixon Reason for Consultation:     Chronic diarrhea, chronic GERD        HPI:   Tracy Dixon is a 83 y.o. female referred by Dr. Judithann Novas, MD  for consultation & management of chronic diarrhea.  Patient has been experiencing 2-3 nonbloody bowel movements daily, predominantly postprandial associated with abdominal cramps.  This has been ongoing for approximately 1 year.  She denies any abdominal bloating, rectal bleeding, weight loss, loss of appetite.  She is retired, works Armed forces operational officer for [employer].  She had anxiety during the pandemic.  She lives with her husband.  She reports last week she had mac & cheese, next day she had 2-3 soft bowel movements.  Patient is functionally independent, does not feel like she is 83 year old  She does not smoke or drink alcohol  Follow-up visit 05/16/2021 Patient underwent stool studies for work-up of chronic diarrhea, found to have worsening enterocolitis infection.  She was treated with 5 days course of ciprofloxacin .  Pancreatic fecal elastase levels were normal, H. pylori stool antigen was normal.  Celiac disease panel revealed normal TTG IgA levels.  Patient's diarrhea has resolved since then.  She is very pleased with this improvement.  She is currently having 1 soft bowel movement daily.  Follow-up visit 04/08/2024 [name] is here to discuss about flareup of GERD symptoms and watery bowel movements.  She drinks coffee with creamer daily and has intermittent flareup of acid reflux.  Most recently when she had a flareup, she increased omeprazole to 40 mg and took Pepcid at night which relieved her symptoms.  She decreased her omeprazole back to 20 mg and  taking Pepcid.  She is also concerned about loose bowel movements although not several bowel movements daily.  She generally has 1-2 soft to pudding-like bowel movements usually triggered after a meal and depending on what she eats.  She denies any abdominal bloating, her weight has been stable.  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[facility])    followed by [facility], dx in [place]    Allergy    Arthritis    Asthma    Complication of anesthesia    stays sleepy   Diverticulosis of colon    Gallstones    Hyperlipidemia    Hypertension    Osteoarthritis    Symptomatic mammary hypertrophy     Past Surgical History:  Procedure Laterality Date   Bilateral tubal ligation  2005   BREAST REDUCTION SURGERY Bilateral 06/04/2018   Procedure: MAMMARY REDUCTION  (BREAST);  Surgeon: Thornell Flirt, DO;  Location: [facility];  Service: Plastics;  Laterality: Bilateral;   Carotid doppler  07/2006   Neg   CESAREAN SECTION  2005   CHOLECYSTECTOMY  1982   DECOMPRESSIVE LUMBAR LAMINECTOMY LEVEL 3  06/10/2013   Procedure: DECOMPRESSIVE LUMBAR LAMINECTOMY LEVEL 3;  Surgeon: Mort Ards, MD;  Location: [facility] OR;  Service: Orthopedics;;  lumbar three to five decompression   MRI  brain  2007   acoustic neuroma (check yearly with MRI)   PFT's  12/2006   (-) FEV1/FVC 79%, FEV1 84%   REDUCTION MAMMAPLASTY Bilateral 05/2018   REPLACEMENT TOTAL KNEE BILATERAL  2005   TONSILLECTOMY  2005   TRIGGER FINGER RELEASE Left 09/05/2022   TUBAL LIGATION      Current Outpatient Medications:    albuterol  (VENTOLIN  HFA) 108 (90 Base) MCG/ACT inhaler, Inhale 2 puffs into the lungs every 6 (six) hours as needed for wheezing or shortness of breath., Disp: 8 g, Rfl:  0   amLODipine  (NORVASC ) 5 MG tablet, TAKE 1 TABLET BY MOUTH DAILY, Disp: 90 tablet, Rfl: 3   atorvastatin  (LIPITOR) 20 MG tablet, TAKE 1 TABLET BY MOUTH ONCE  DAILY, Disp: 90 tablet, Rfl: 3   cetirizine (ZYRTEC) 10 MG tablet, Take 10 mg by mouth daily as needed for allergies. Reported on 01/10/2016, Disp: , Rfl:    cholecalciferol (VITAMIN D ) 1000 UNITS tablet, Take 1,000 Units by mouth daily., Disp: , Rfl:    colestipol (COLESTID) 1 g tablet, Take 1 tablet (1 g total) by mouth 2 (two) times daily. As needed, Disp: 30 tablet, Rfl: 0   hydrochlorothiazide  (MICROZIDE ) 12.5 MG capsule, TAKE 1 CAPSULE BY MOUTH DAILY, Disp: 90 capsule, Rfl: 3   omeprazole (PRILOSEC OTC) 20 MG tablet, Take 20 mg by mouth daily., Disp: , Rfl:    Family History  Problem Relation Age of Onset   Cancer Mother        Lung CA   Stroke Mother        CVA   Cancer Father        Prostate   Heart disease Neg Hx    Breast cancer Neg Hx      Social History   Tobacco Use   Smoking status: Former    Current packs/day: 0.00    Types: Cigarettes    Start date: 06/02/1958    Quit date: 06/02/1973    Years since quitting: 50.8   Smokeless tobacco: Never   Tobacco comments:    few cigs/day  Vaping Use   Vaping status: Never Used  Substance Use Topics   Alcohol use: No   Drug use: No    Allergies as of 04/08/2024 - Review Complete 04/08/2024  Allergen Reaction Noted   Contrast media [iodinated contrast media] Hives 05/26/2013   Crab extract  08/24/2022   Crab [shellfish allergy] Hives 05/26/2013    Review of Systems:    All systems reviewed and negative except where noted in HPI.   Physical Exam:  BP 138/80 (BP Location: Left Arm, Patient Position: Sitting, Cuff Size: Large)   Pulse (!) 112   Temp 98 F (36.7 C) (Oral)   Wt 169 lb (76.7 kg)   BMI 31.93 kg/m  No LMP recorded. Patient is postmenopausal.  General:   Alert,  Well-developed, well-nourished, pleasant and cooperative in NAD Head:   Normocephalic and atraumatic. Eyes:  Sclera clear, no icterus.   Conjunctiva pink. Ears:  Normal auditory acuity. Nose:  No deformity, discharge, or lesions. Mouth:  No deformity or lesions,oropharynx pink & moist. Neck:  Supple; no masses or thyromegaly. Lungs:  Respirations even and unlabored.  Clear throughout to auscultation.   No wheezes, crackles, or rhonchi. No acute distress. Heart:  Regular rate and rhythm; no murmurs, clicks, rubs, or gallops. Abdomen:  Normal bowel sounds. Soft, non-tender and non-distended without masses, hepatosplenomegaly or hernias noted.  No guarding or rebound tenderness.   Rectal:  Not performed Msk:  Symmetrical without gross deformities. Good, equal movement & strength bilaterally. Pulses:  Normal pulses noted. Extremities:  No clubbing or edema.  No cyanosis. Neurologic:  Alert and oriented x3;  grossly normal neurologically. Skin:  Intact without significant lesions or rashes. No jaundice. Psych:  Alert and cooperative. Normal mood and affect.  Imaging Studies: Reviewed  Assessment and Plan:   MARIBETH BELTZ is a 83 y.o. pleasant female with no significant past medical history is seen in for follow-up of chronic loose bowel movements.  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  Patient's diarrhea has currently resolved No evidence of anemia, TSH negative, celiac panel, H. pylori stool antigen, pancreatic fecal elastase levels are normal.  Check fecal calprotectin levels, food allergy profile, alpha gal panel Trial of colestipol tablets I have discussed with her regarding colonoscopy to rule out microscopic colitis which she deferred  Follow up as needed or contact via MyChart as needed   Tracy Oz, MD

## 2024-04-14 ENCOUNTER — Encounter (INDEPENDENT_AMBULATORY_CARE_PROVIDER_SITE_OTHER): Payer: Self-pay

## 2024-04-15 DIAGNOSIS — K529 Noninfective gastroenteritis and colitis, unspecified: Secondary | ICD-10-CM | POA: Diagnosis not present

## 2024-04-17 LAB — ALPHA-GAL PANEL
Allergen Lamb IgE: <0.1 kU/L
Beef IgE: <0.1 kU/L
IgE (Immunoglobulin E), Serum: 89 [IU]/mL (ref 6–495)
O215-IgE Alpha-Gal: <0.1 kU/L
Pork IgE: <0.1 kU/L

## 2024-04-17 LAB — FOOD ALLERGY PROFILE
Allergen Corn, IgE: <0.1 kU/L
Clam IgE: <0.1 kU/L
Codfish IgE: <0.1 kU/L
Egg White IgE: <0.1 kU/L
Milk IgE: <0.1 kU/L
Peanut IgE: <0.1 kU/L
Scallop IgE: <0.1 kU/L
Sesame Seed IgE: <0.1 kU/L
Shrimp IgE: <0.1 kU/L
Soybean IgE: <0.1 kU/L
Walnut IgE: <0.1 kU/L
Wheat IgE: <0.1 kU/L

## 2024-04-18 LAB — CALPROTECTIN, FECAL: Calprotectin, Fecal: 133 ug/g — ABNORMAL HIGH (ref 0–120)

## 2024-04-20 ENCOUNTER — Ambulatory Visit: Payer: Self-pay | Admitting: Gastroenterology

## 2024-04-22 NOTE — Addendum Note (Signed)
 Addended by: Lovie Rudder on: 04/22/2024 11:11 AM   Modules accepted: Orders

## 2024-04-23 ENCOUNTER — Encounter: Payer: Self-pay | Admitting: Family Medicine

## 2024-04-23 NOTE — Telephone Encounter (Signed)
 Please forward to [facility] triage for triage ASAP 04/25/2019 5 AM.  I would have forwarded it but I cannot find the correct pool

## 2024-04-24 ENCOUNTER — Ambulatory Visit: Payer: Self-pay | Admitting: *Deleted

## 2024-04-24 ENCOUNTER — Other Ambulatory Visit: Payer: Self-pay

## 2024-04-24 ENCOUNTER — Ambulatory Visit: Payer: Self-pay

## 2024-04-24 ENCOUNTER — Emergency Department
Admission: EM | Admit: 2024-04-24 | Discharge: 2024-04-24 | Disposition: A | Discharge status: Home / Self Care | Attending: Emergency Medicine | Admitting: Emergency Medicine

## 2024-04-24 DIAGNOSIS — E876 Hypokalemia: Secondary | ICD-10-CM | POA: Insufficient documentation

## 2024-04-24 DIAGNOSIS — R531 Weakness: Secondary | ICD-10-CM | POA: Diagnosis present

## 2024-04-24 DIAGNOSIS — R195 Other fecal abnormalities: Secondary | ICD-10-CM | POA: Diagnosis not present

## 2024-04-24 DIAGNOSIS — I10 Essential (primary) hypertension: Secondary | ICD-10-CM | POA: Insufficient documentation

## 2024-04-24 DIAGNOSIS — F419 Anxiety disorder, unspecified: Secondary | ICD-10-CM | POA: Diagnosis not present

## 2024-04-24 LAB — CBC
HCT: 38.8 % (ref 36.0–46.0)
Hemoglobin: 12.7 g/dL (ref 12.0–15.0)
MCH: 25.6 pg — ABNORMAL LOW (ref 26.0–34.0)
MCHC: 32.7 g/dL (ref 30.0–36.0)
MCV: 78.1 fL — ABNORMAL LOW (ref 80.0–100.0)
Platelets: 281 10*3/uL (ref 150–400)
RBC: 4.97 MIL/uL (ref 3.87–5.11)
RDW: 17.2 % — ABNORMAL HIGH (ref 11.5–15.5)
WBC: 7.5 10*3/uL (ref 4.0–10.5)
nRBC: 0 % (ref 0.0–0.2)

## 2024-04-24 LAB — COMPREHENSIVE METABOLIC PANEL WITH GFR
ALT: 14 U/L (ref 0–44)
AST: 17 U/L (ref 15–41)
Albumin: 3.3 g/dL — ABNORMAL LOW (ref 3.5–5.0)
Alkaline Phosphatase: 55 U/L (ref 38–126)
Anion gap: 6 (ref 5–15)
BUN: 18 mg/dL (ref 8–23)
CO2: 32 mmol/L (ref 22–32)
Calcium: 9.1 mg/dL (ref 8.9–10.3)
Chloride: 102 mmol/L (ref 98–111)
Creatinine, Ser: 0.75 mg/dL (ref 0.44–1.00)
GFR, Estimated: >60 mL/min (ref >60)
Glucose, Bld: 94 mg/dL (ref 70–99)
Potassium: 2.8 mmol/L — ABNORMAL LOW (ref 3.5–5.1)
Sodium: 140 mmol/L (ref 135–145)
Total Bilirubin: 0.7 mg/dL (ref 0.0–1.2)
Total Protein: 7.5 g/dL (ref 6.5–8.1)

## 2024-04-24 MED ORDER — POTASSIUM CHLORIDE CRYS ER 20 MEQ PO TBCR: 20.0000 meq | EXTENDED_RELEASE_TABLET | Freq: Two times a day (BID) | ORAL | 0 refills | Status: DC

## 2024-04-24 NOTE — Telephone Encounter (Signed)
 I spoke with pt; on 06/23/24 pt was standing at bathroom sink and pt said lt leg felt weak and then pt could not feel legs at all and pt fell to the floor.pt said she laid on the floor for a few mins trying to think what she could do. Pt crawled to her bed and pulled up. The rest of the day pt did not feel right in her legs but she could walk. Pt never had this happen before. Hx of sciatica. No problem with speech,no CP,SOB,H/A dizziness or vision changes.,04/24/24 pt does not have numbness in legs but does have a tight feeling in left leg this morning. No available appts at [facility] or [facility]. Pt is going to [facility] ED which is near her home for eval. Sending note to Dr Cherlyn Cornet and [place] pool.
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
## 2024-04-24 NOTE — Telephone Encounter (Signed)
 Patient returned a VM from NT. Per chart, patient has been advised to go to ED for symptoms. This RN advised ED. Patient verbalized understanding and stated she would go.   Reason for Disposition  Health Information question, no triage required and triager able to answer question  Protocols used: Information Only Call - No Triage-A-AH

## 2024-04-24 NOTE — ED Triage Notes (Signed)
 Pt to ED via POV from home. Pt ambulatory to triage with cane and provided wheelchair. Pt reports yesterday she was brushing her teeth and both of her legs gave out and she slid to the floor. Pt denies LOC or head trauma. Pt reports she crawled to the bed and assister herself up. Pt with hx of neuropathy. Pt A&Ox4. Pt denies SOB, HA, CP or N/V/D.

## 2024-04-24 NOTE — Telephone Encounter (Signed)
 Agree.  With spinal stenosis history and severity of symptoms I agree with ER disposition.

## 2024-04-24 NOTE — Telephone Encounter (Signed)
 Chief Complaint: bilateral LE weakness yesterday and lost control of legs but reports no sx today . Symptoms: denies weakness in LEs today. Had episode 04/23/24 standing at sink and left leg felt weak and then both legs weak and lowered self to the floor. Crawled to bed. Mobility returned to legs but reports used cane trough out day due to possible weakness returning. Reports hx sciatica  Frequency: yesterday did not report how long weakness lasted  Pertinent Negatives: Patient denies weakness in LEs no weakness either side of body no headache no blurred vision reported no N/T no issues with balance or dizziness.  Disposition: [x] ED /[] Urgent Dixon (no appt availability in office) / [] Appointment(In office/virtual)/ []  Tracy Dixon/ [] Home Dixon/ [] Refused Recommended Disposition /[] [facility]/ []  Follow-up with PCP Additional Notes:    Due to no sx now scheduled patient appt with PCP for f/u 04/28/24. Recommended if episode happens again or weakness returns go to ED.  In review of chart noted at 9:21 am PCP recommended patient go to ED today . Called patient back to report PCP message to go to ED now.        Copied from CRM [id number]. Topic: Clinical - Red Word Triage >> Apr 24, 2024  9:13 AM Oddis Bench wrote: Red Word that prompted transfer to Nurse Triage: Patient is returning call did not find notes for call but did  find Dr note :Tracy Novas, MD    04/23/24  5:42 PM Note Please forward to [facility] triage for triage ASAP 04/25/2019 5 AM.  I would have forwarded it but I cannot find the correct pool This is the original note from patient: Good morning, when I woke up this morning my leg felt a little weak. I went to use the bathroom, then to the sink to brush teeth. While doing so I felt weaker and eventually didn't feel my legs and fell to the floor. I laid there for 2 or 3 minutes crawled to bed pulled myself up and felt ok. I walked to kitchen took medicine,  sat for ten minutes. I made oatmeal. I still feel Ok it is now 11:15 the incident happened at 8:45.  I'm concerned because it never happened before.  I don't know what cause me to completely loose all feeling. Do you have any advice? Thanks Tracy Dixon Reason for Disposition  [1] MODERATE weakness (i.e., interferes with work, school, normal activities) AND [2] persists > 3 days  Answer Assessment - Initial Assessment Questions 1. DESCRIPTION: "Describe how you are feeling."     No weakness in extremities today  but had episode of legs becoming weak yesterday and lowered self to the ground . 2. SEVERITY: "How bad is it?"  "Can you stand and walk?"   - MILD (0-3): Feels weak or tired, but does not interfere with work, school or normal activities.   - MODERATE (4-7): Able to stand and walk; weakness interferes with work, school, or normal activities.   - SEVERE (8-10): Unable to stand or walk; unable to do usual activities.     Can stand and walk now using cane  3. ONSET: "When did these symptoms begin?" (e.g., hours, days, weeks, months)     Yesterday morning 4. CAUSE: "What do you think is causing the weakness or fatigue?" (e.g., not drinking enough fluids, medical problem, trouble sleeping)     Not sure  5. NEW MEDICINES:  "Have you started on any new medicines recently?" (e.g., opioid  pain medicines, benzodiazepines, muscle relaxants, antidepressants, antihistamines, neuroleptics, beta blockers)     na 6. OTHER SYMPTOMS: "Do you have any other symptoms?" (e.g., chest pain, fever, cough, SOB, vomiting, diarrhea, bleeding, other areas of pain)     Denies any sx now. See report of episode in chart from 04/23/24. 7. PREGNANCY: "Is there any chance you are pregnant?" "When was your last menstrual period?"     na  Protocols used: Weakness (Generalized) and Fatigue-A-AH

## 2024-04-26 LAB — GI PROFILE, STOOL, PCR

## 2024-04-28 ENCOUNTER — Ambulatory Visit: Admitting: Family Medicine

## 2024-04-28 ENCOUNTER — Encounter: Payer: Self-pay | Admitting: Family Medicine

## 2024-04-28 ENCOUNTER — Ambulatory Visit (INDEPENDENT_AMBULATORY_CARE_PROVIDER_SITE_OTHER): Admitting: Family Medicine

## 2024-04-28 VITALS — BP 120/76 | HR 103 | Temp 98.0°F | Ht 60.5 in | Wt 170.5 lb

## 2024-04-28 DIAGNOSIS — R000 Tachycardia, unspecified: Secondary | ICD-10-CM | POA: Diagnosis not present

## 2024-04-28 DIAGNOSIS — E876 Hypokalemia: Secondary | ICD-10-CM | POA: Insufficient documentation

## 2024-04-28 DIAGNOSIS — R29898 Other symptoms and signs involving the musculoskeletal system: Secondary | ICD-10-CM | POA: Diagnosis not present

## 2024-04-28 LAB — BASIC METABOLIC PANEL WITH GFR
BUN: 18 mg/dL (ref 6–23)
CO2: 32 meq/L (ref 19–32)
Calcium: 9.3 mg/dL (ref 8.4–10.5)
Chloride: 102 meq/L (ref 96–112)
Creatinine, Ser: 0.79 mg/dL (ref 0.40–1.20)
GFR: 69.31 mL/min (ref >60.00)
Glucose, Bld: 88 mg/dL (ref 70–99)
Potassium: 3.9 meq/L (ref 3.5–5.1)
Sodium: 140 meq/L (ref 135–145)

## 2024-04-28 LAB — C DIFFICILE TOXINS A+B W/RFLX: C difficile Toxins A+B, EIA: NEGATIVE

## 2024-04-28 LAB — C DIFFICILE, CYTOTOXIN B

## 2024-04-28 LAB — TSH: TSH: 2.55 u[IU]/mL (ref 0.35–5.50)

## 2024-04-28 NOTE — Assessment & Plan Note (Signed)
 Acute, unclear cause.  May be related to history of spinal stenosis versus hypokalemia versus deconditioning. Weakness has resolved.  I encouraged her to do chair exercise to strengthen legs.

## 2024-04-28 NOTE — Assessment & Plan Note (Addendum)
 Chronic intermittent.  She has not taken her amlodipine  yet today so this may be why her heart rate is higher today.  She is asymptomatic.  Heart exam shows regular sinus. I did note that at her GI office visit it was noted at 117 heart rate. EKG in the ER was unremarkable although tachycardia. Will check be met and TSH.  CBC was normal in ER. No sign of dehydration.  She drinks minimal caffeine.  She will follow her heart rate at home and let me know what measurements are over the next week.  If her blood pressure could tolerate we can consider increasing amlodipine .

## 2024-04-28 NOTE — Patient Instructions (Addendum)
 Stop hydrochlorothiazide .  I will let you know if you need to continue potassium supplement.  Skin lesions are seborrheic keratosis  Do home PT for leg strengthening.   Follow HR and BP at home...  call with measurement in week.

## 2024-04-28 NOTE — Assessment & Plan Note (Signed)
 She has had this intermittently in the past but it was significantly lower in the ED.  She has been on 4 to 5 days of 20 mEq potassium.  We will recheck it in the office today. I will have her stop hydrochlorothiazide  as this is minimally helpful for her peripheral swelling and is likely the cause of her low potassium.  She does have diarrhea like stool although only once in the morning daily which could be contributing to loss of potassium.  Once labs return we will make a decision on whether she needs to continue potassium.  She will follow her blood pressure at home and let me know in a week if her blood pressure measurements remain at goal off the hydrochlorothiazide .

## 2024-04-28 NOTE — Progress Notes (Signed)
 Patient ID: Tracy Dixon, female    DOB: [birth year], 83 y.o.   MRN: [id number]  This visit was conducted in person.  BP 120/76   Pulse (!) 103   Temp 98 F (36.7 C) (Temporal)   Ht 5' 0.5" (1.537 m)   Wt 170 lb 8 oz (77.3 kg)   SpO2 98%   BMI 32.75 kg/m    CC:  Chief Complaint  Patient presents with   Follow-up    ER 04/24/2024    Subjective:   HPI: Tracy Dixon is a 83 y.o. female presenting on 04/28/2024 for Follow-up (ER 04/24/2024)  Recent ED visit 04/24/2024 reviewed. Presented with Bilateral leg weakness.. slid to the ground.. felt fine later and since. Went to ED for evaluation.  Lab workup revealed hypokalemia 2.8.  Neuroexam was normal. Symptoms had resolved completely so she returned home  No syncope, no LOC, no head injury.    Hx of spinal stenosis and sciatica.  Last EPI 02/2024 did not help.  No current weakness in lags.  Has history of hypokalemia, but never this low... on hydrochlorothiazide .. does not help with chronic swelling. May also be due to diarrhea chronic.  Started on Kdur.     No chest pain, no palpations, no SOB.   Increase HR here and at last GI OV  and in ER.. EKG unremarkable. Inly 1 cup coffee a day  On amlodipine .. has not taken yet today.. usually takes in AM.  Relevant past medical, surgical, family and social history reviewed and updated as indicated. Interim medical history since our last visit reviewed. Allergies and medications reviewed and updated. Outpatient Medications Prior to Visit  Medication Sig Dispense Refill   albuterol  (VENTOLIN  HFA) 108 (90 Base) MCG/ACT inhaler Inhale 2 puffs into the lungs every 6 (six) hours as needed for wheezing or shortness of breath. 8 g 0   amLODipine  (NORVASC ) 5 MG tablet TAKE 1 TABLET BY MOUTH DAILY 90 tablet 3   atorvastatin  (LIPITOR) 20 MG tablet TAKE 1 TABLET BY MOUTH ONCE  DAILY 90 tablet 3   cetirizine (ZYRTEC) 10 MG tablet Take 10 mg by mouth daily as needed for allergies.  Reported on 01/10/2016     cholecalciferol (VITAMIN D ) 1000 UNITS tablet Take 1,000 Units by mouth daily.     colestipol  (COLESTID ) 1 g tablet Take 1 tablet (1 g total) by mouth 2 (two) times daily. As needed 30 tablet 0   omeprazole (PRILOSEC OTC) 20 MG tablet Take 20 mg by mouth daily.     potassium chloride  SA (KLOR-CON  M) 20 MEQ tablet Take 1 tablet (20 mEq total) by mouth 2 (two) times daily. 20 tablet 0   hydrochlorothiazide  (MICROZIDE ) 12.5 MG capsule TAKE 1 CAPSULE BY MOUTH DAILY 90 capsule 3   No facility-administered medications prior to visit.     Per HPI unless specifically indicated in ROS section below Review of Systems  Constitutional:  Negative for fatigue and fever.  HENT:  Negative for congestion.   Eyes:  Negative for pain.  Respiratory:  Negative for cough and shortness of breath.   Cardiovascular:  Negative for chest pain, palpitations and leg swelling.  Gastrointestinal:  Negative for abdominal pain.  Genitourinary:  Negative for dysuria and vaginal bleeding.  Musculoskeletal:  Negative for back pain.  Neurological:  Negative for syncope, light-headedness and headaches.  Psychiatric/Behavioral:  Negative for dysphoric mood.    Objective:  BP 120/76   Pulse (!) 103   Temp 98 F (  36.7 C) (Temporal)   Ht 5' 0.5" (1.537 m)   Wt 170 lb 8 oz (77.3 kg)   SpO2 98%   BMI 32.75 kg/m   Wt Readings from Last 3 Encounters:  04/28/24 170 lb 8 oz (77.3 kg)  04/08/24 169 lb (76.7 kg)  03/16/24 168 lb (76.2 kg)      Physical Exam Constitutional:      General: She is not in acute distress.    Appearance: Normal appearance. She is well-developed. She is not ill-appearing or toxic-appearing.  HENT:     Head: Normocephalic.     Right Ear: Hearing, tympanic membrane, ear canal and external ear normal. Tympanic membrane is not erythematous, retracted or bulging.     Left Ear: Hearing, tympanic membrane, ear canal and external ear normal. Tympanic membrane is not  erythematous, retracted or bulging.     Nose: No mucosal edema or rhinorrhea.     Right Sinus: No maxillary sinus tenderness or frontal sinus tenderness.     Left Sinus: No maxillary sinus tenderness or frontal sinus tenderness.     Mouth/Throat:     Pharynx: Uvula midline.  Eyes:     General: Lids are normal. Lids are everted, no foreign bodies appreciated.     Conjunctiva/sclera: Conjunctivae normal.     Pupils: Pupils are equal, round, and reactive to light.  Neck:     Thyroid : No thyroid  mass or thyromegaly.     Vascular: No carotid bruit.     Trachea: Trachea normal.  Cardiovascular:     Rate and Rhythm: Regular rhythm. Tachycardia present.     Pulses: Normal pulses.     Heart sounds: Normal heart sounds, S1 normal and S2 normal. No murmur heard.    No friction rub. No gallop.  Pulmonary:     Effort: Pulmonary effort is normal. No tachypnea or respiratory distress.     Breath sounds: Normal breath sounds. No decreased breath sounds, wheezing, rhonchi or rales.  Abdominal:     General: Bowel sounds are normal.     Palpations: Abdomen is soft.     Tenderness: There is no abdominal tenderness.  Musculoskeletal:     Cervical back: Normal range of motion and neck supple.     Right lower leg: No edema.     Left lower leg: No edema.  Skin:    General: Skin is warm and dry.     Findings: No rash.  Neurological:     Mental Status: She is alert.  Psychiatric:        Mood and Affect: Mood is not anxious or depressed.        Speech: Speech normal.        Behavior: Behavior normal. Behavior is cooperative.        Thought Content: Thought content normal.        Judgment: Judgment normal.       Results for orders placed or performed during the hospital encounter of 04/24/24  Comprehensive metabolic panel   Collection Time: 04/24/24 11:12 AM  Result Value Ref Range   Sodium 140 135 - 145 mmol/L   Potassium 2.8 (L) 3.5 - 5.1 mmol/L   Chloride 102 98 - 111 mmol/L   CO2 32 22 -  32 mmol/L   Glucose, Bld 94 70 - 99 mg/dL   BUN 18 8 - 23 mg/dL   Creatinine, Ser 1.61 0.44 - 1.00 mg/dL   Calcium  9.1 8.9 - 10.3 mg/dL   Total Protein 7.5 6.5 -  8.1 g/dL   Albumin 3.3 (L) 3.5 - 5.0 g/dL   AST 17 15 - 41 U/L   ALT 14 0 - 44 U/L   Alkaline Phosphatase 55 38 - 126 U/L   Total Bilirubin 0.7 0.0 - 1.2 mg/dL   GFR, Estimated >69 >62 mL/min   Anion gap 6 5 - 15  CBC   Collection Time: 04/24/24 11:12 AM  Result Value Ref Range   WBC 7.5 4.0 - 10.5 K/uL   RBC 4.97 3.87 - 5.11 MIL/uL   Hemoglobin 12.7 12.0 - 15.0 g/dL   HCT 95.2 84.1 - 32.4 %   MCV 78.1 (L) 80.0 - 100.0 fL   MCH 25.6 (L) 26.0 - 34.0 pg   MCHC 32.7 30.0 - 36.0 g/dL   RDW 40.1 (H) 02.7 - 25.3 %   Platelets 281 150 - 400 K/uL   nRBC 0.0 0.0 - 0.2 %    Assessment and Plan  Bilateral leg weakness Assessment & Plan: Acute, unclear cause.  May be related to history of spinal stenosis versus hypokalemia versus deconditioning. Weakness has resolved.  I encouraged her to do chair exercise to strengthen legs.   Hypokalemia Assessment & Plan: She has had this intermittently in the past but it was significantly lower in the ED.  She has been on 4 to 5 days of 20 mEq potassium.  We will recheck it in the office today. I will have her stop hydrochlorothiazide  as this is minimally helpful for her peripheral swelling and is likely the cause of her low potassium.  She does have diarrhea like stool although only once in the morning daily which could be contributing to loss of potassium.  Once labs return we will make a decision on whether she needs to continue potassium.  She will follow her blood pressure at home and let me know in a week if her blood pressure measurements remain at goal off the hydrochlorothiazide .  Orders: -     Basic metabolic panel with GFR  Tachycardia Assessment & Plan: Chronic intermittent.  She has not taken her amlodipine  yet today so this may be why her heart rate is higher today.  She  is asymptomatic.  Heart exam shows regular sinus. I did note that at her GI office visit it was noted at 117 heart rate. EKG in the ER was unremarkable although tachycardia. Will check be met and TSH.  CBC was normal in ER. No sign of dehydration.  She drinks minimal caffeine.  She will follow her heart rate at home and let me know what measurements are over the next week.  If her blood pressure could tolerate we can consider increasing amlodipine .  Orders: -     TSH    No follow-ups on file.   Herby Lolling, MD

## 2024-04-29 ENCOUNTER — Ambulatory Visit: Payer: Self-pay | Admitting: Family Medicine

## 2024-05-11 ENCOUNTER — Ambulatory Visit (INDEPENDENT_AMBULATORY_CARE_PROVIDER_SITE_OTHER): Admitting: Podiatry

## 2024-05-11 ENCOUNTER — Encounter: Payer: Self-pay | Admitting: Podiatry

## 2024-05-11 VITALS — Ht 60.5 in | Wt 170.5 lb

## 2024-05-11 DIAGNOSIS — M79674 Pain in right toe(s): Secondary | ICD-10-CM

## 2024-05-11 DIAGNOSIS — M79672 Pain in left foot: Secondary | ICD-10-CM | POA: Diagnosis not present

## 2024-05-11 DIAGNOSIS — Q828 Other specified congenital malformations of skin: Secondary | ICD-10-CM

## 2024-05-11 DIAGNOSIS — M79671 Pain in right foot: Secondary | ICD-10-CM

## 2024-05-11 DIAGNOSIS — M79675 Pain in left toe(s): Secondary | ICD-10-CM | POA: Diagnosis not present

## 2024-05-11 DIAGNOSIS — B351 Tinea unguium: Secondary | ICD-10-CM | POA: Diagnosis not present

## 2024-05-15 ENCOUNTER — Ambulatory Visit
Admission: EM | Admit: 2024-05-15 | Discharge: 2024-05-15 | Disposition: A | Discharge status: Home / Self Care | Attending: Emergency Medicine | Admitting: Emergency Medicine

## 2024-05-15 DIAGNOSIS — R350 Frequency of micturition: Secondary | ICD-10-CM | POA: Diagnosis not present

## 2024-05-15 DIAGNOSIS — N3000 Acute cystitis without hematuria: Secondary | ICD-10-CM | POA: Insufficient documentation

## 2024-05-15 LAB — POCT URINALYSIS DIP (MANUAL ENTRY)
Bilirubin, UA: NEGATIVE
Glucose, UA: NEGATIVE mg/dL
Ketones, POC UA: NEGATIVE mg/dL
Nitrite, UA: POSITIVE — AB
Protein Ur, POC: 100 mg/dL — AB
Spec Grav, UA: 1.02 (ref 1.010–1.025)
Urobilinogen, UA: 0.2 U/dL
pH, UA: 5.5 (ref 5.0–8.0)

## 2024-05-15 MED ORDER — NITROFURANTOIN MONOHYD MACRO 100 MG PO CAPS
100.0000 mg | ORAL_CAPSULE | Freq: Two times a day (BID) | ORAL | 0 refills | Status: DC
Start: 2024-05-15 — End: 2024-05-26

## 2024-05-15 NOTE — Discharge Instructions (Addendum)
Your urinalysis shows Trev Boley blood cells and nitrates which are indicative of infection, your urine will be sent to the lab to determine exactly which bacteria is present, if any changes need to be made to your medications you will be notified  Begin use of Macrobid twice daily for 5 days   You may use over-the-counter Azo to help minimize your symptoms until antibiotic removes bacteria, this medication will turn your urine orange  Increase your fluid intake through use of water  As always practice good hygiene, wiping front to back and avoidance of scented vaginal products to prevent further irritation  If symptoms continue to persist after use of medication or recur please follow-up with urgent care or your primary doctor as needed  

## 2024-05-15 NOTE — ED Triage Notes (Signed)
 Pt being seen in UC for urinary frequency for approximately 5 days. Pt denies burning with urination. Pt denies fevers.  Pt reports taking otc medication with no relief.

## 2024-05-15 NOTE — ED Provider Notes (Signed)
 Tracy Dixon    CSN: [id number] Arrival date & time: 05/15/24  1024      History   Chief Complaint Chief Complaint  Patient presents with   Urinary Frequency    HPI Tracy Dixon is a 83 y.o. female.   Patient presents for evaluation of urinary presenting for 5 days.  Initially experiencing dysuria but has been taking Azo which has been helpful.  Denies fever, abdominal pain, flank pain or hematuria, vaginal symptoms.  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[facility])    followed by [facility], dx in [place]    Allergy     Arthritis    Asthma    Complication of anesthesia    stays sleepy   Diverticulosis of colon    Gallstones    Hyperlipidemia    Hypertension    Osteoarthritis    Symptomatic mammary hypertrophy     Patient Active Problem List   Diagnosis Date Noted   Bilateral leg weakness 04/28/2024   Hypokalemia 04/28/2024   Tachycardia 04/28/2024   Chronic diarrhea 01/22/2023   Pain in joint of left knee 12/28/2022   Arthritis of hand 03/19/2022   Cervical radiculopathy 07/26/2021   Acquired trigger finger of left middle finger 07/06/2021   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   Gastroesophageal reflux disease 12/13/2020   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   Pain and swelling of left lower leg 03/24/2020   Vitamin D  deficiency 01/21/2020   Prediabetes 01/21/2020   History of bilateral breast reduction surgery 08/27/2018   Chronic venous insufficiency 05/12/2018   Lymphedema 05/12/2018   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   Metabolic syndrome 09/30/2014   Spinal stenosis of lumbar region 03/17/2013   Acute left-sided low back pain with left-sided sciatica 09/28/2011   Osteopenia 05/23/2009   Sciatica of left side associated with disorder of lumbar spine 09/25/2007   High cholesterol 09/22/2007   OBESITY 09/22/2007   Essential hypertension, benign 09/22/2007   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   Allergic rhinitis 09/22/2007   Mild intermittent asthma 09/22/2007   Diverticulosis of colon 09/22/2007   DJD (degenerative joint disease) 09/22/2007    Past Surgical History:  Procedure Laterality Date   Bilateral tubal ligation  2005   BREAST REDUCTION SURGERY Bilateral 06/04/2018   Procedure: MAMMARY REDUCTION  (BREAST);  Surgeon: Thornell Flirt, DO;  Location: [facility];  Service: Plastics;  Laterality: Bilateral;   Carotid doppler  07/2006   Neg   CESAREAN SECTION  2005   CHOLECYSTECTOMY  1982   DECOMPRESSIVE LUMBAR LAMINECTOMY LEVEL 3  06/10/2013   Procedure: DECOMPRESSIVE LUMBAR LAMINECTOMY LEVEL 3;  Surgeon: Mort Ards, MD;  Location: [facility] OR;  Service: Orthopedics;;  lumbar three to five decompression   MRI brain  2007   acoustic neuroma (check yearly with MRI)   PFT's  12/2006   (-) FEV1/FVC 79%, FEV1 84%   REDUCTION MAMMAPLASTY Bilateral 05/2018   REPLACEMENT TOTAL KNEE BILATERAL  2005   TONSILLECTOMY  2005   TRIGGER FINGER RELEASE Left 09/05/2022   TUBAL LIGATION      OB History   No obstetric history on file.      Home Medications    Prior to Admission medications   Medication Sig Start Date End Date Taking? Authorizing Provider  nitrofurantoin , macrocrystal-monohydrate, (MACROBID ) 100 MG capsule Take 1 capsule (100 mg total) by mouth 2 (two) times daily. 05/15/24  Yes Jaquelin Meaney, Maybelle Spatz,  NP  albuterol  (VENTOLIN  HFA) 108 (90 Base) MCG/ACT inhaler Inhale 2 puffs into the lungs every 6 (six) hours as needed for wheezing or shortness of breath. 01/28/24   Bedsole, Amy E, MD  amLODipine  (NORVASC ) 5 MG tablet TAKE 1 TABLET BY MOUTH DAILY Patient taking differently: Take 10 mg by mouth daily. 03/10/24   Bedsole, Amy E, MD  atorvastatin  (LIPITOR) 20 MG tablet TAKE 1 TABLET BY MOUTH ONCE  DAILY 02/18/24   Bedsole, Amy E, MD  cetirizine (ZYRTEC) 10 MG tablet Take 10 mg by mouth daily as needed for allergies. Reported on  01/10/2016    [provider]  cholecalciferol (VITAMIN D ) 1000 UNITS tablet Take 1,000 Units by mouth daily.    [provider]  colestipol  (COLESTID ) 1 g tablet Take 1 tablet (1 g total) by mouth 2 (two) times daily. As needed 04/08/24   Vanga, Rohini Reddy, MD  omeprazole (PRILOSEC OTC) 20 MG tablet Take 20 mg by mouth daily.    [provider]    Family History Family History  Problem Relation Age of Onset   Cancer Mother        Lung CA   Stroke Mother        CVA   Cancer Father        Prostate   Heart disease Neg Hx    Breast cancer Neg Hx     Social History Social History   Tobacco Use   Smoking status: Former    Current packs/day: 0.00    Types: Cigarettes    Start date: 06/02/1958    Quit date: 06/02/1973    Years since quitting: 50.9   Smokeless tobacco: Never   Tobacco comments:    few cigs/day  Vaping Use   Vaping status: Never Used  Substance Use Topics   Alcohol use: No   Drug use: No     Allergies   Contrast media [iodinated contrast media], Crab extract, and Crab [shellfish allergy ]   Review of Systems Review of Systems   Physical Exam Triage Vital Signs ED Triage Vitals  Encounter Vitals Group     BP 05/15/24 1045 134/71     Girls Systolic BP Percentile --      Girls Diastolic BP Percentile --      Boys Systolic BP Percentile --      Boys Diastolic BP Percentile --      Pulse Rate 05/15/24 1045 98     Resp 05/15/24 1045 18     Temp 05/15/24 1045 97.8 F (36.6 C)     Temp Source 05/15/24 1045 Oral     SpO2 05/15/24 1045 94 %     Weight --      Height --      Head Circumference --      Peak Flow --      Pain Score 05/15/24 1046 0     Pain Loc --      Pain Education --      Exclude from Growth Chart --    No data found.  Updated Vital Signs BP 134/71 (BP Location: Right Arm)   Pulse 98   Temp 97.8 F (36.6 C) (Oral)   Resp 18   SpO2 94%   Visual Acuity Right Eye Distance:   Left Eye Distance:    Bilateral Distance:    Right Eye Near:   Left Eye Near:    Bilateral Near:     Physical Exam Constitutional:  Appearance: Normal appearance.   Eyes:     Extraocular Movements: Extraocular movements intact.   Abdominal:     Tenderness: There is no abdominal tenderness. There is no right CVA tenderness, left CVA tenderness or guarding.   Neurological:     Mental Status: She is alert and oriented to person, place, and time.      UC Treatments / Results  Labs (all labs ordered are listed, but only abnormal results are displayed) Labs Reviewed  POCT URINALYSIS DIP (MANUAL ENTRY) - Abnormal; Notable for the following components:      Result Value   Clarity, UA cloudy (*)    Blood, UA moderate (*)    Protein Ur, POC =100 (*)    Nitrite, UA Positive (*)    Leukocytes, UA Large (3+) (*)    All other components within normal limits  URINE CULTURE    EKG   Radiology No results found.  Procedures Procedures (including critical care time)  Medications Ordered in UC Medications - No data to display  Initial Impression / Assessment and Plan / UC Course  I have reviewed the triage vital signs and the nursing notes.  Pertinent labs & imaging results that were available during my care of the patient were reviewed by me and considered in my medical decision making (see chart for details).  Acute cystitis without hematuria, urinary frequency  Urinalysis showing leukocytes and nitrates, sent for culture, per chart review E. coli typically present, antibiotic chosen based on this, prescribed Macrobid , has had success within the past, recommended supportive care with follow-up as needed Final Clinical Impressions(s) / UC Diagnoses   Final diagnoses:  Urinary frequency  Acute cystitis without hematuria     Discharge Instructions      Your urinalysis shows Muneeb Veras blood cells and nitrates which are indicative of infection, your urine will be sent to the lab to determine  exactly which bacteria is present, if any changes need to be made to your medications you will be notified  Begin use of Macrobid  twice daily for 5 days  You may use over-the-counter Azo to help minimize your symptoms until antibiotic removes bacteria, this medication will turn your urine orange  Increase your fluid intake through use of water  As always practice good hygiene, wiping front to back and avoidance of scented vaginal products to prevent further irritation  If symptoms continue to persist after use of medication or recur please follow-up with urgent care or your primary doctor as needed    ED Prescriptions     Medication Sig Dispense Auth. Provider   nitrofurantoin , macrocrystal-monohydrate, (MACROBID ) 100 MG capsule Take 1 capsule (100 mg total) by mouth 2 (two) times daily. 10 capsule Trevious Rampey R, NP      PDMP not reviewed this encounter.   Reena Canning, NP 05/15/24 1116

## 2024-05-17 ENCOUNTER — Encounter: Payer: Self-pay | Admitting: Family Medicine

## 2024-05-17 LAB — URINE CULTURE: Culture: >=100000 — AB

## 2024-05-17 NOTE — Progress Notes (Signed)
 Subjective:  Patient ID: Tracy Dixon, female    DOB: 1941/06/26,  MRN: [id number]  Tracy Dixon presents to clinic today for painful porokeratotic lesion(s) of both feet and painful mycotic toenails that limit ambulation. Painful toenails interfere with ambulation. Aggravating factors include wearing enclosed shoe gear. Pain is relieved with periodic professional debridement. Painful porokeratotic lesions are aggravated when weightbearing with and without shoegear. Pain is relieved with periodic professional debridement.  Chief Complaint  Patient presents with   Nail Problem    Pt is here for [facility] PCP is Dr Avelina and LOV was in early June.   New problem(s): None.   PCP is Avelina Greig BRAVO, MD.  Allergies  Allergen Reactions   Contrast Media [Iodinated Contrast Media] Hives   Crab Extract     Other reaction(s): Not available   Crab [Shellfish Allergy ] Hives    Review of Systems: Negative except as noted in the HPI.  Objective: No changes noted in today's physical examination. There were no vitals filed for this visit. Tracy Dixon is a pleasant 83 y.o. female WD, WN in NAD. AAO x 3.  Vascular Examination: Capillary refill time immediate b/l. Palpable pedal pulses. Pedal hair present b/l. No pain with calf compression b/l. Skin temperature gradient WNL b/l. No cyanosis or clubbing b/l. No ischemia or gangrene noted b/l. Trace edema noted BLE. Varicosities present b/l.  Neurological Examination: Sensation grossly intact b/l with 10 gram monofilament. Vibratory sensation intact b/l.   Dermatological Examination: Pedal skin with normal turgor, texture and tone b/l.  No open wounds. No interdigital macerations.   Toenails 1-5 b/l thick, discolored, elongated with subungual debris and pain on dorsal palpation.   Porokeratotic lesion(s) IPJ of right great toe, submet head 2 right foot, and plantarlateral aspect of midfoot right foot. No erythema, no edema, no drainage, no  fluctuance.  Musculoskeletal Examination: Muscle strength 5/5 to all LE muscle groups of b/l lower extremities. No pain, crepitus or joint limitation noted with ROM bilateral LE. No gross bony deformities bilaterally.  Radiographs: None  Last A1c:      Latest Ref Rng & Units 01/21/2024    8:48 AM  Hemoglobin A1C  Hemoglobin-A1c 4.6 - 6.5 % 5.9     Assessment/Plan: 1. Pain due to onychomycosis of toenails of both feet   2. Porokeratosis   3. Foot pain, bilateral   Patient was evaluated and treated. All patient's and/or POA's questions/concerns addressed on today's visit. Mycotic toenails 1-5 debrided in length and girth without incident. Porokeratotic lesion(s) IPJ of right great toe, submet head 2 right foot, and plantarlateral aspect of midfoot right foot pared with sharp debridement without incident. Continue soft, supportive shoe gear daily. Report any pedal injuries to medical professional. Call office if there are any questions/concerns. -Patient/POA to call should there be question/concern in the interim.   Return in about 9 weeks (around 07/13/2024).  Delon LITTIE Merlin, DPM      [place] LOCATION: [address], [place] [address]                   Office [phone]   [facility] LOCATION: [address] [place], [place] [address] Office [phone]

## 2024-05-18 ENCOUNTER — Ambulatory Visit (HOSPITAL_COMMUNITY): Payer: Self-pay

## 2024-05-26 ENCOUNTER — Ambulatory Visit (INDEPENDENT_AMBULATORY_CARE_PROVIDER_SITE_OTHER): Admitting: Family Medicine

## 2024-05-26 ENCOUNTER — Encounter: Payer: Self-pay | Admitting: Family Medicine

## 2024-05-26 VITALS — BP 122/60 | HR 100 | Temp 99.5°F | Ht 60.5 in | Wt 175.1 lb

## 2024-05-26 DIAGNOSIS — R000 Tachycardia, unspecified: Secondary | ICD-10-CM

## 2024-05-26 DIAGNOSIS — R600 Localized edema: Secondary | ICD-10-CM | POA: Insufficient documentation

## 2024-05-26 DIAGNOSIS — I10 Essential (primary) hypertension: Secondary | ICD-10-CM | POA: Diagnosis not present

## 2024-05-26 MED ORDER — FUROSEMIDE 20 MG PO TABS: 20.0000 mg | ORAL_TABLET | Freq: Every day | ORAL | 3 refills | Status: DC | PRN

## 2024-05-26 MED ORDER — POTASSIUM CHLORIDE CRYS ER 20 MEQ PO TBCR: 20.0000 meq | EXTENDED_RELEASE_TABLET | Freq: Every day | ORAL | 3 refills | Status: DC | PRN

## 2024-05-26 NOTE — Assessment & Plan Note (Signed)
 Chronic, asymptomatic.  Rate well-controlled at home on calcium  channel blocker. Negative CBC, thyroid  and EKG at recent testing and in ER.

## 2024-05-26 NOTE — Assessment & Plan Note (Signed)
 Chronic, well-controlled on amlodipine  10 mg p.o. daily.  If peripheral edema continuing despite Lasix may need to stop amlodipine  and change to a different medication although this did not make much of a difference in the past.

## 2024-05-26 NOTE — Assessment & Plan Note (Signed)
 Acute worsening of chronic issue likely secondary to stopping hydrochlorothiazide  given hypokalemia. Also may be due to increasing amlodipine  to 10 mg daily. She does also have venous insufficiency.  Will have her start Lasix 20 mg daily along with 20 mill equivalents K-Dur each time taking Lasix.  She will use the Lasix as needed for peripheral swelling. Recent thyroid  liver, kidney and EKG within normal limits. Will evaluate with echocardiogram.  No evidence of pulmonary edema.  Return and ER precautions provided.

## 2024-05-26 NOTE — Progress Notes (Signed)
 Patient ID: Tracy Dixon, female    DOB: 1941-08-21, 83 y.o.   MRN: [id number]  This visit was conducted in person.  BP 122/60   Pulse 100   Temp 99.5 F (37.5 C) (Temporal)   Ht 5' 0.5 (1.537 m)   Wt 175 lb 2 oz (79.4 kg)   SpO2 96%   BMI 33.64 kg/m    CC:  Chief Complaint  Patient presents with   Foot Swelling    Bilateral    Subjective:   HPI: Tracy Dixon is a 83 y.o. female presenting on 05/26/2024 for Foot Swelling (Bilateral)  Bilateral peripheral edema: Chronic issue with recent worsening She reports gradaully worseing swelling ni bilateral feet. She is on amlodipine  10 mg daily now in the last month given increased BPs.. see phone note 04/29/2024  Stopped hydrochlorothiazide  6/3 given hypokalemia.  History of chronic venous insufficiency and varicose veins, followed by vascular status post sclerotherapy BP Readings from Last 3 Encounters:  05/26/24 122/60  05/15/24 134/71  04/28/24 120/76   Wt Readings from Last 3 Encounters:  05/26/24 175 lb 2 oz (79.4 kg)  05/11/24 170 lb 8 oz (77.3 kg)  04/28/24 170 lb 8 oz (77.3 kg)   Tachycardia: Noted at last OV  04/28/2024 and GI OV:  She is asymptomatic.  I did note that at her GI office visit it was noted at 117 heart rate. EKG in the ER was unremarkable although tachycardia. BMET and TSH nml.  CBC was normal in ER. No sign of dehydration.  She drinks minimal caffeine. Increased amlodipine  at that time to 10 mg daily      No CP, no SOB. HR at home lately 90-93. Relevant past medical, surgical, family and social history reviewed and updated as indicated. Interim medical history since our last visit reviewed. Allergies and medications reviewed and updated. Outpatient Medications Prior to Visit  Medication Sig Dispense Refill   albuterol  (VENTOLIN  HFA) 108 (90 Base) MCG/ACT inhaler Inhale 2 puffs into the lungs every 6 (six) hours as needed for wheezing or shortness of breath. 8 g 0   amLODipine  (NORVASC ) 5 MG  tablet Take 10 mg by mouth daily.     atorvastatin  (LIPITOR) 20 MG tablet TAKE 1 TABLET BY MOUTH ONCE  DAILY 90 tablet 3   cetirizine (ZYRTEC) 10 MG tablet Take 10 mg by mouth daily as needed for allergies. Reported on 01/10/2016     cholecalciferol (VITAMIN D ) 1000 UNITS tablet Take 1,000 Units by mouth daily.     colestipol  (COLESTID ) 1 g tablet Take 1 tablet (1 g total) by mouth 2 (two) times daily. As needed 30 tablet 0   omeprazole (PRILOSEC OTC) 20 MG tablet Take 20 mg by mouth daily.     amLODipine  (NORVASC ) 5 MG tablet TAKE 1 TABLET BY MOUTH DAILY (Patient taking differently: Take 10 mg by mouth daily.) 90 tablet 3   nitrofurantoin , macrocrystal-monohydrate, (MACROBID ) 100 MG capsule Take 1 capsule (100 mg total) by mouth 2 (two) times daily. 10 capsule 0   No facility-administered medications prior to visit.     Per HPI unless specifically indicated in ROS section below Review of Systems  Constitutional:  Negative for fatigue and fever.  HENT:  Negative for congestion.   Eyes:  Negative for pain.  Respiratory:  Negative for cough and shortness of breath.   Cardiovascular:  Positive for leg swelling. Negative for chest pain and palpitations.  Gastrointestinal:  Negative for abdominal pain.  Genitourinary:  Negative for dysuria and vaginal bleeding.  Musculoskeletal:  Negative for back pain.  Neurological:  Negative for syncope, light-headedness and headaches.  Psychiatric/Behavioral:  Negative for dysphoric mood.    Objective:  BP 122/60   Pulse 100   Temp 99.5 F (37.5 C) (Temporal)   Ht 5' 0.5 (1.537 m)   Wt 175 lb 2 oz (79.4 kg)   SpO2 96%   BMI 33.64 kg/m   Wt Readings from Last 3 Encounters:  05/26/24 175 lb 2 oz (79.4 kg)  05/11/24 170 lb 8 oz (77.3 kg)  04/28/24 170 lb 8 oz (77.3 kg)      Physical Exam Constitutional:      General: She is not in acute distress.    Appearance: Normal appearance. She is well-developed. She is not ill-appearing or  toxic-appearing.  HENT:     Head: Normocephalic.     Right Ear: Hearing, tympanic membrane, ear canal and external ear normal. Tympanic membrane is not erythematous, retracted or bulging.     Left Ear: Hearing, tympanic membrane, ear canal and external ear normal. Tympanic membrane is not erythematous, retracted or bulging.     Nose: Mucosal edema present. No rhinorrhea.     Right Sinus: No maxillary sinus tenderness or frontal sinus tenderness.     Left Sinus: No maxillary sinus tenderness or frontal sinus tenderness.     Mouth/Throat:     Pharynx: Uvula midline.   Eyes:     General: Lids are normal. Lids are everted, no foreign bodies appreciated.     Conjunctiva/sclera: Conjunctivae normal.     Pupils: Pupils are equal, round, and reactive to light.   Neck:     Thyroid : No thyroid  mass or thyromegaly.     Vascular: No carotid bruit.     Trachea: Trachea normal.   Cardiovascular:     Rate and Rhythm: Normal rate and regular rhythm.     Pulses: Normal pulses.     Heart sounds: Normal heart sounds, S1 normal and S2 normal. No murmur heard.    No friction rub. No gallop.  Pulmonary:     Effort: Pulmonary effort is normal. No tachypnea or respiratory distress.     Breath sounds: Normal breath sounds. No decreased breath sounds, wheezing, rhonchi or rales.  Abdominal:     General: Bowel sounds are normal.     Palpations: Abdomen is soft.     Tenderness: There is no abdominal tenderness.   Musculoskeletal:     Cervical back: Normal range of motion and neck supple.     Right lower leg: 2+ Edema present.     Left lower leg: 2+ Edema present.   Skin:    General: Skin is warm and dry.     Findings: No rash.   Neurological:     Mental Status: She is alert.   Psychiatric:        Mood and Affect: Mood is not anxious or depressed.        Speech: Speech normal.        Behavior: Behavior normal. Behavior is cooperative.        Thought Content: Thought content normal.         Judgment: Judgment normal.       Results for orders placed or performed during the hospital encounter of 05/15/24  Urine Culture   Collection Time: 05/15/24 10:57 AM   Specimen: Urine, Clean Catch  Result Value Ref Range   Specimen Description URINE, CLEAN CATCH  Special Requests      NONE Performed at [facility], [address] [place], [place] [address]    Culture >=100,000 COLONIES/mL ESCHERICHIA COLI (A)    Report Status 05/17/2024 FINAL    Organism ID, Bacteria ESCHERICHIA COLI (A)       Susceptibility   Escherichia coli - MIC*    AMPICILLIN >=32 RESISTANT Resistant     CEFAZOLIN  <=4 SENSITIVE Sensitive     CEFEPIME <=0.12 SENSITIVE Sensitive     CEFTRIAXONE <=0.25 SENSITIVE Sensitive     CIPROFLOXACIN  <=0.25 SENSITIVE Sensitive     GENTAMICIN >=16 RESISTANT Resistant     IMIPENEM <=0.25 SENSITIVE Sensitive     NITROFURANTOIN  <=16 SENSITIVE Sensitive     TRIMETH /SULFA  <=20 SENSITIVE Sensitive     AMPICILLIN/SULBACTAM >=32 RESISTANT Resistant     PIP/TAZO <=4 SENSITIVE Sensitive ug/mL    * >=100,000 COLONIES/mL ESCHERICHIA COLI  POCT urinalysis dipstick   Collection Time: 05/15/24 10:57 AM  Result Value Ref Range   Color, UA yellow yellow   Clarity, UA cloudy (A) clear   Glucose, UA negative negative mg/dL   Bilirubin, UA negative negative   Ketones, POC UA negative negative mg/dL   Spec Grav, UA 8.979 8.989 - 1.025   Blood, UA moderate (A) negative   pH, UA 5.5 5.0 - 8.0   Protein Ur, POC =100 (A) negative mg/dL   Urobilinogen, UA 0.2 0.2 or 1.0 E.U./dL   Nitrite, UA Positive (A) Negative   Leukocytes, UA Large (3+) (A) Negative    Assessment and Plan  Peripheral edema Assessment & Plan: Acute worsening of chronic issue likely secondary to stopping hydrochlorothiazide  given hypokalemia. Also may be due to increasing amlodipine  to 10 mg daily. She does also have venous insufficiency.  Will have her start Lasix 20 mg daily along with 20 mill  equivalents K-Dur each time taking Lasix.  She will use the Lasix as needed for peripheral swelling. Recent thyroid  liver, kidney and EKG within normal limits. Will evaluate with echocardiogram.  No evidence of pulmonary edema.  Return and ER precautions provided.  Orders: -     ECHOCARDIOGRAM COMPLETE; Future  Essential hypertension, benign Assessment & Plan: Chronic, well-controlled on amlodipine  10 mg p.o. daily.  If peripheral edema continuing despite Lasix may need to stop amlodipine  and change to a different medication although this did not make much of a difference in the past.   Tachycardia Assessment & Plan: Chronic, asymptomatic.  Rate well-controlled at home on calcium  channel blocker. Negative CBC, thyroid  and EKG at recent testing and in ER.   Other orders -     Furosemide; Take 1 tablet (20 mg total) by mouth daily as needed for edema or fluid (take with potassium).  Dispense: 30 tablet; Refill: 3 -     Potassium Chloride  Crys ER; Take 1 tablet (20 mEq total) by mouth daily as needed (take only when taking lasix).  Dispense: 30 tablet; Refill: 3    No follow-ups on file.   Greig Ring, MD

## 2024-06-04 DIAGNOSIS — M48061 Spinal stenosis, lumbar region without neurogenic claudication: Secondary | ICD-10-CM | POA: Diagnosis not present

## 2024-06-04 DIAGNOSIS — M5416 Radiculopathy, lumbar region: Secondary | ICD-10-CM | POA: Diagnosis not present

## 2024-06-05 ENCOUNTER — Encounter: Payer: Self-pay | Admitting: Family Medicine

## 2024-06-08 ENCOUNTER — Other Ambulatory Visit: Payer: Self-pay | Admitting: Neurosurgery

## 2024-06-08 DIAGNOSIS — M48061 Spinal stenosis, lumbar region without neurogenic claudication: Secondary | ICD-10-CM

## 2024-06-11 ENCOUNTER — Ambulatory Visit
Admission: RE | Admit: 2024-06-11 | Discharge: 2024-06-11 | Disposition: A | Discharge status: Home / Self Care | Source: Ambulatory Visit | Attending: Neurosurgery | Admitting: Neurosurgery

## 2024-06-11 DIAGNOSIS — M48061 Spinal stenosis, lumbar region without neurogenic claudication: Secondary | ICD-10-CM | POA: Insufficient documentation

## 2024-06-17 ENCOUNTER — Encounter: Payer: Self-pay | Admitting: Family Medicine

## 2024-06-19 ENCOUNTER — Encounter: Payer: Self-pay | Admitting: *Deleted

## 2024-06-24 ENCOUNTER — Other Ambulatory Visit: Payer: Self-pay | Admitting: Family Medicine

## 2024-06-24 MED ORDER — LOSARTAN POTASSIUM 50 MG PO TABS: 50.0000 mg | ORAL_TABLET | Freq: Every day | ORAL | 11 refills | Status: DC

## 2024-07-01 ENCOUNTER — Encounter: Payer: Self-pay | Admitting: Gastroenterology

## 2024-07-01 ENCOUNTER — Other Ambulatory Visit: Payer: Self-pay

## 2024-07-01 ENCOUNTER — Ambulatory Visit
Admission: RE | Admit: 2024-07-01 | Discharge: 2024-07-01 | Disposition: A | Discharge status: Home / Self Care | Attending: Gastroenterology | Admitting: Gastroenterology

## 2024-07-01 ENCOUNTER — Encounter
Admission: RE | Disposition: A | Discharge status: Home / Self Care | Payer: Self-pay | Source: Home / Self Care | Attending: Gastroenterology

## 2024-07-01 ENCOUNTER — Ambulatory Visit: Admitting: General Practice

## 2024-07-01 DIAGNOSIS — K5730 Diverticulosis of large intestine without perforation or abscess without bleeding: Secondary | ICD-10-CM | POA: Diagnosis not present

## 2024-07-01 DIAGNOSIS — R197 Diarrhea, unspecified: Secondary | ICD-10-CM | POA: Diagnosis not present

## 2024-07-01 DIAGNOSIS — I10 Essential (primary) hypertension: Secondary | ICD-10-CM | POA: Diagnosis not present

## 2024-07-01 DIAGNOSIS — Z87891 Personal history of nicotine dependence: Secondary | ICD-10-CM | POA: Insufficient documentation

## 2024-07-01 DIAGNOSIS — R195 Other fecal abnormalities: Secondary | ICD-10-CM | POA: Insufficient documentation

## 2024-07-01 DIAGNOSIS — K529 Noninfective gastroenteritis and colitis, unspecified: Secondary | ICD-10-CM | POA: Diagnosis not present

## 2024-07-01 DIAGNOSIS — K219 Gastro-esophageal reflux disease without esophagitis: Secondary | ICD-10-CM | POA: Diagnosis not present

## 2024-07-01 HISTORY — PX: COLONOSCOPY: SHX5424

## 2024-07-01 SURGERY — COLONOSCOPY
Anesthesia: General

## 2024-07-01 MED ORDER — SODIUM CHLORIDE 0.9 % IV SOLN: INTRAVENOUS | Status: DC

## 2024-07-01 MED ORDER — PROPOFOL 10 MG/ML IV BOLUS
INTRAVENOUS | Status: DC | PRN
  Administered 2024-07-01: 50 mg via INTRAVENOUS

## 2024-07-01 MED ORDER — PROPOFOL 500 MG/50ML IV EMUL
INTRAVENOUS | Status: DC | PRN
  Administered 2024-07-01: 150 ug/kg/min via INTRAVENOUS

## 2024-07-01 MED ORDER — LIDOCAINE HCL (CARDIAC) PF 100 MG/5ML IV SOSY
PREFILLED_SYRINGE | INTRAVENOUS | Status: DC | PRN
  Administered 2024-07-01: 50 mg via INTRAVENOUS

## 2024-07-01 NOTE — H&P (Signed)
 Tracy JONELLE Brooklyn, MD [facility], [address]  [place], [place] [address]  Main: [phone] Fax:  [phone] Pager: [phone]   Primary Care Physician:  Avelina Greig BRAVO, MD Primary Gastroenterologist:  Dr. Corinn JONELLE Dixon  Pre-Procedure History & Physical: HPI:  Tracy Dixon is a 83 y.o. female is here for an colonoscopy.   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[facility])    followed by [facility], dx in [place]    Allergy     Arthritis    Asthma    Complication of anesthesia    stays sleepy   Diverticulosis of colon    Gallstones    Hyperlipidemia    Hypertension    Osteoarthritis    Symptomatic mammary hypertrophy     Past Surgical History:  Procedure Laterality Date   Bilateral tubal ligation  2005   BREAST REDUCTION SURGERY Bilateral 06/04/2018   Procedure: MAMMARY REDUCTION  (BREAST);  Surgeon: Lowery Estefana RAMAN, DO;  Location: [facility];  Service: Plastics;  Laterality: Bilateral;   Carotid doppler  07/2006   Neg   CESAREAN SECTION  2005   CHOLECYSTECTOMY  1982   DECOMPRESSIVE LUMBAR LAMINECTOMY LEVEL 3  06/10/2013   Procedure: DECOMPRESSIVE LUMBAR LAMINECTOMY LEVEL 3;  Surgeon: Donaciano Sprang, MD;  Location: [facility] OR;  Service: Orthopedics;;  lumbar three to five decompression   JOINT REPLACEMENT     MRI brain  2007   acoustic neuroma (check yearly with MRI)   PFT's  12/2006   (-) FEV1/FVC 79%, FEV1 84%   REDUCTION MAMMAPLASTY Bilateral 05/2018   REPLACEMENT TOTAL KNEE BILATERAL  2005   TONSILLECTOMY  2005   TRIGGER FINGER RELEASE Left 09/05/2022   TUBAL LIGATION      Prior to Admission medications   Medication Sig Start Date End Date Taking? Authorizing Provider  albuterol  (VENTOLIN  HFA) 108 (90 Base) MCG/ACT inhaler Inhale 2 puffs into the lungs every 6 (six) hours as needed for wheezing or shortness of breath. 01/28/24   Bedsole, Amy E, MD  atorvastatin  (LIPITOR) 20 MG tablet TAKE  1 TABLET BY MOUTH ONCE  DAILY 02/18/24   Bedsole, Amy E, MD  cetirizine (ZYRTEC) 10 MG tablet Take 10 mg by mouth daily as needed for allergies. Reported on 01/10/2016    [provider]  cholecalciferol (VITAMIN D ) 1000 UNITS tablet Take 1,000 Units by mouth daily.    [provider]  colestipol  (COLESTID ) 1 g tablet Take 1 tablet (1 g total) by mouth 2 (two) times daily. As needed 04/08/24   Lovie Zarling Reddy, MD  furosemide  (LASIX ) 20 MG tablet Take 1 tablet (20 mg total) by mouth daily as needed for edema or fluid (take with potassium). 05/26/24   Bedsole, Amy E, MD  losartan  (COZAAR ) 50 MG tablet Take 1 tablet (50 mg total) by mouth daily. 06/24/24   Bedsole, Amy E, MD  omeprazole (PRILOSEC OTC) 20 MG tablet Take 20 mg by mouth daily.    [provider]  potassium chloride  SA (KLOR-CON  M) 20 MEQ tablet Take 1 tablet (20 mEq total) by mouth daily as needed (take only when taking lasix ). 05/26/24   Avelina Greig BRAVO, MD    Allergies as of 06/05/2024 - Review Complete 05/26/2024  Allergen Reaction Noted   Contrast media [iodinated contrast media] Hives 05/26/2013   Crab extract  08/24/2022   Crab [shellfish allergy ] Hives 05/26/2013    Family History  Problem Relation Age  of Onset   Cancer Mother        Lung CA   Stroke Mother        CVA   Cancer Father        Prostate   Heart disease Neg Hx    Breast cancer Neg Hx     Social History   Socioeconomic History   Marital status: Widowed    Spouse name: Not on file   Number of children: 4   Years of education: Not on file   Highest education level: Bachelor's degree (e.g., BA, AB, BS)  Occupational History   Occupation: Retired Herbalist - Insurance account manager: RETIRED  Tobacco Use   Smoking status: Former    Current packs/day: 0.00    Types: Cigarettes    Start date: 06/02/1958    Quit date: 06/02/1973    Years since quitting: 51.1   Smokeless tobacco: Never   Tobacco comments:    few cigs/day   Vaping Use   Vaping status: Never Used  Substance and Sexual Activity   Alcohol use: No   Drug use: No   Sexual activity: Not on file  Other Topics Concern   Not on file  Social History Narrative   Regular exercise:  No   Diet:  Fruits and veggies, rare FF, no water    No living will, no HCPOA.   Social Drivers of Corporate investment banker Strain: Low Risk  (05/25/2024)   Overall Financial Resource Strain (CARDIA)    Difficulty of Paying Living Expenses: Not very hard  Food Insecurity: No Food Insecurity (05/25/2024)   Hunger Vital Sign    Worried About Running Out of Food in the Last Year: Never true    Ran Out of Food in the Last Year: Never true  Transportation Needs: No Transportation Needs (05/25/2024)   PRAPARE - Administrator, Civil Service (Medical): No    Lack of Transportation (Non-Medical): No  Physical Activity: Insufficiently Active (05/25/2024)   Exercise Vital Sign    Days of Exercise per Week: 1 day    Minutes of Exercise per Session: 20 min  Stress: No Stress Concern Present (05/25/2024)   [employer] of Occupational Health - Occupational Stress Questionnaire    Feeling of Stress: Not at all  Social Connections: Moderately Isolated (05/25/2024)   Social Connection and Isolation Panel    Frequency of Communication with Friends and Family: More than three times a week    Frequency of Social Gatherings with Friends and Family: Once a week    Attends Religious Services: 1 to 4 times per year    Active Member of [employer] or Organizations: No    Attends Banker Meetings: Not on file    Marital Status: Widowed  Intimate Partner Violence: Not At Risk (01/27/2024)   Humiliation, Afraid, Rape, and Kick questionnaire    Fear of Current or Ex-Partner: No    Emotionally Abused: No    Physically Abused: No    Sexually Abused: No    Review of Systems: See HPI, otherwise negative ROS  Physical Exam: BP (!) 143/60   Pulse 90   Temp (!)  95.6 F (35.3 C) (Temporal)   Resp 17   Ht 5' 1 (1.549 m)   Wt 75 kg   SpO2 100%   BMI 31.25 kg/m  General:   Alert,  pleasant and cooperative in NAD Head:  Normocephalic and atraumatic. Neck:  Supple; no masses  or thyromegaly. Lungs:  Clear throughout to auscultation.    Heart:  Regular rate and rhythm. Abdomen:  Soft, nontender and nondistended. Normal bowel sounds, without guarding, and without rebound.   Neurologic:  Alert and  oriented x4;  grossly normal neurologically.  Impression/Plan: Tracy Dixon is here for an colonoscopy to be performed for chronic diarrhea  Risks, benefits, limitations, and alternatives regarding  colonoscopy have been reviewed with the patient.  Questions have been answered.  All parties agreeable.   Tracy Brooklyn, MD  07/01/2024, 10:25 AM

## 2024-07-01 NOTE — Transfer of Care (Signed)
 Immediate Anesthesia Transfer of Care Note  Patient: Tracy Dixon  Procedure(s) Performed: COLONOSCOPY  Patient Location: Endoscopy Unit  Anesthesia Type:General  Level of Consciousness: drowsy and patient cooperative  Airway & Oxygen Therapy: Patient Spontanous Breathing and Patient connected to nasal cannula oxygen  Post-op Assessment: Report given to RN, Post -op Vital signs reviewed and stable, and Patient moving all extremities X 4  Post vital signs: Reviewed and stable  Last Vitals:  Vitals Value Taken Time  BP 91/49 07/01/24 10:52  Temp    Pulse 80 07/01/24 10:52  Resp 20 07/01/24 10:52  SpO2 98 % 07/01/24 10:52  Vitals shown include unfiled device data.  Last Pain:  Vitals:   07/01/24 0939  TempSrc: Temporal  PainSc: 0-No pain         Complications: No notable events documented.

## 2024-07-01 NOTE — Anesthesia Preprocedure Evaluation (Signed)
 Anesthesia Evaluation  Patient identified by MRN, date of birth, ID band Patient awake    Reviewed: Allergy  & Precautions, NPO status , Patient's Chart, lab work & pertinent test results  Airway Mallampati: II  TM Distance: >3 FB Neck ROM: Full    Dental no notable dental hx. (+) Chipped   Pulmonary asthma , former smoker   Pulmonary exam normal breath sounds clear to auscultation       Cardiovascular hypertension, Normal cardiovascular exam Rhythm:Regular Rate:Normal     Neuro/Psych negative neurological ROS  negative psych ROS   GI/Hepatic Neg liver ROS,GERD  Medicated,,  Endo/Other  negative endocrine ROS    Renal/GU negative Renal ROS  negative genitourinary   Musculoskeletal   Abdominal   Peds  Hematology negative hematology ROS (+)   Anesthesia Other Findings Past Medical History: No date: Acoustic neuroma (HCC)     Comment:  followed by [facility], dx in [place]  No date: Allergy  No date: Arthritis No date: Asthma No date: Complication of anesthesia     Comment:  stays sleepy No date: Diverticulosis of colon No date: Gallstones No date: Hyperlipidemia No date: Hypertension No date: Osteoarthritis No date: Symptomatic mammary hypertrophy  Past Surgical History: 2005: Bilateral tubal ligation 06/04/2018: BREAST REDUCTION SURGERY; Bilateral     Comment:  Procedure: MAMMARY REDUCTION  (BREAST);  Surgeon:               Lowery Estefana RAMAN, DO;  Location: [facility];  Service: Plastics;  Laterality: Bilateral; 07/2006: Carotid doppler     Comment:  Neg 2005: CESAREAN SECTION 1982: CHOLECYSTECTOMY 06/10/2013: DECOMPRESSIVE LUMBAR LAMINECTOMY LEVEL 3     Comment:  Procedure: DECOMPRESSIVE LUMBAR LAMINECTOMY LEVEL 3;                Surgeon: Donaciano Sprang, MD;  Location: [facility];  Service:               Orthopedics;;  lumbar three to five decompression No date: JOINT  REPLACEMENT 2007: MRI brain     Comment:  acoustic neuroma (check yearly with MRI) 12/2006: PFT's     Comment:  (-) FEV1/FVC 79%, FEV1 84% 05/2018: REDUCTION MAMMAPLASTY; Bilateral 2005: REPLACEMENT TOTAL KNEE BILATERAL 2005: TONSILLECTOMY 09/05/2022: TRIGGER FINGER RELEASE; Left No date: TUBAL LIGATION  BMI    Body Mass Index: 31.25 kg/m      Reproductive/Obstetrics negative OB ROS                              Anesthesia Physical Anesthesia Plan  ASA: 2  Anesthesia Plan: General   Post-op Pain Management: Minimal or no pain anticipated   Induction: Intravenous  PONV Risk Score and Plan: 2 and Propofol  infusion and TIVA  Airway Management Planned: Nasal Cannula  Additional Equipment: None  Intra-op Plan:   Post-operative Plan:   Informed Consent: I have reviewed the patients History and Physical, chart, labs and discussed the procedure including the risks, benefits and alternatives for the proposed anesthesia with the patient or authorized representative who has indicated his/her understanding and acceptance.     Dental advisory given  Plan Discussed with: CRNA and Surgeon  Anesthesia Plan Comments: (Discussed risks of anesthesia with patient, including possibility of difficulty with spontaneous ventilation under anesthesia necessitating airway intervention, PONV, and rare risks such as cardiac or respiratory or neurological events, and  allergic reactions. Discussed the role of CRNA in patient's perioperative care. Patient understands.)        Anesthesia Quick Evaluation

## 2024-07-01 NOTE — Op Note (Signed)
 [facility] Gastroenterology Patient Name: Cleva Camero Procedure Date: 07/01/2024 10:15 AM MRN: [id number] Account #: [id number] Date of Birth: 07/04/1941 Admit Type: Outpatient Age: 83 Room: [facility] ENDO ROOM 4 Gender: Female Note Status: Finalized Instrument Name: Peds Colonoscope [id number] Procedure:             Colonoscopy Indications:           Chronic diarrhea, elevated fecal calprotectin levels Providers:             Corinn Jess Brooklyn MD, MD Referring MD:          Greig CHARLENA Ring MD, MD (Referring MD) Medicines:             General Anesthesia Complications:         No immediate complications. Estimated blood loss: None. Procedure:             Pre-Anesthesia Assessment:                        - Prior to the procedure, a History and Physical was                         performed, and patient medications and allergies were                         reviewed. The patient is competent. The risks and                         benefits of the procedure and the sedation options and                         risks were discussed with the patient. All questions                         were answered and informed consent was obtained.                         Patient identification and proposed procedure were                         verified by the physician, the nurse, the                         anesthesiologist, the anesthetist and the technician                         in the pre-procedure area in the procedure room in the                         endoscopy suite. Mental Status Examination: alert and                         oriented. Airway Examination: normal oropharyngeal                         airway and neck mobility. Respiratory Examination:                         clear to auscultation. CV Examination: normal.  Prophylactic Antibiotics: The patient does not require                         prophylactic antibiotics. Prior Anticoagulants: The                          patient has taken no anticoagulant or antiplatelet                         agents. ASA Grade Assessment: II - A patient with mild                         systemic disease. After reviewing the risks and                         benefits, the patient was deemed in satisfactory                         condition to undergo the procedure. The anesthesia                         plan was to use general anesthesia. Immediately prior                         to administration of medications, the patient was                         re-assessed for adequacy to receive sedatives. The                         heart rate, respiratory rate, oxygen saturations,                         blood pressure, adequacy of pulmonary ventilation, and                         response to care were monitored throughout the                         procedure. The physical status of the patient was                         re-assessed after the procedure.                        After obtaining informed consent, the colonoscope was                         passed under direct vision. Throughout the procedure,                         the patient's blood pressure, pulse, and oxygen                         saturations were monitored continuously. The                         Colonoscope was introduced through the anus and  advanced to the the cecum, identified by appendiceal                         orifice and ileocecal valve. The colonoscopy was                         performed without difficulty. The patient tolerated                         the procedure well. The quality of the bowel                         preparation was good. The terminal ileum, ileocecal                         valve, appendiceal orifice, and rectum were                         photographed. Findings:      The perianal and digital rectal examinations were normal. Pertinent       negatives include normal sphincter tone  and no palpable rectal lesions.      The terminal ileum appeared normal.      Multiple diverticula were found in the recto-sigmoid colon and sigmoid       colon.      Normal mucosa was found in the entire colon. Biopsies were taken with a       cold forceps for histology.      The retroflexed view of the distal rectum and anal verge was normal and       showed no anal or rectal abnormalities. Impression:            - The examined portion of the ileum was normal.                        - Diverticulosis in the recto-sigmoid colon and in the                         sigmoid colon.                        - Normal mucosa in the entire examined colon. Biopsied.                        - The distal rectum and anal verge are normal on                         retroflexion view. Recommendation:        - Discharge patient to home (with escort).                        - Resume previous diet today.                        - Continue present medications.                        - Await pathology results.                        -  Return to my office as previously scheduled. Procedure Code(s):     --- Professional ---                        [phone], Colonoscopy, flexible; with biopsy, single or                         multiple Diagnosis Code(s):     --- Professional ---                        K52.9, Noninfective gastroenteritis and colitis,                         unspecified                        K57.30, Diverticulosis of large intestine without                         perforation or abscess without bleeding CPT copyright 2022 American Medical Association. All rights reserved. The codes documented in this report are preliminary and upon coder review may  be revised to meet current compliance requirements. Dr. Corinn Brooklyn Corinn Jess Brooklyn MD, MD 07/01/2024 10:51:50 AM This report has been signed electronically. Number of Addenda: 0 Note Initiated On: 07/01/2024 10:15 AM Scope Withdrawal Time: 0 hours  13 minutes 23 seconds  Total Procedure Duration: 0 hours 15 minutes 40 seconds  Estimated Blood Loss:  Estimated blood loss: none.      [facility]

## 2024-07-02 LAB — SURGICAL PATHOLOGY

## 2024-07-02 NOTE — Anesthesia Postprocedure Evaluation (Signed)
 Anesthesia Post Note  Patient: Tracy Dixon  Procedure(s) Performed: COLONOSCOPY  Patient location during evaluation: Endoscopy Anesthesia Type: General Level of consciousness: awake and alert Pain management: pain level controlled Vital Signs Assessment: post-procedure vital signs reviewed and stable Respiratory status: spontaneous breathing, nonlabored ventilation, respiratory function stable and patient connected to nasal cannula oxygen Cardiovascular status: blood pressure returned to baseline and stable Postop Assessment: no apparent nausea or vomiting Anesthetic complications: no   No notable events documented.   Last Vitals:  Vitals:   07/01/24 1102 07/01/24 1112  BP: 128/72   Pulse: 76   Resp: 16 18  Temp:    SpO2: 100%     Last Pain:  Vitals:   07/01/24 1102  TempSrc:   PainSc: 0-No pain                 Debby Mines

## 2024-07-07 ENCOUNTER — Encounter: Payer: Self-pay | Admitting: Family Medicine

## 2024-07-07 ENCOUNTER — Ambulatory Visit (INDEPENDENT_AMBULATORY_CARE_PROVIDER_SITE_OTHER): Admitting: Family Medicine

## 2024-07-07 ENCOUNTER — Ambulatory Visit: Payer: Self-pay | Admitting: Family Medicine

## 2024-07-07 ENCOUNTER — Ambulatory Visit: Payer: Self-pay | Admitting: Gastroenterology

## 2024-07-07 VITALS — BP 136/70 | HR 94 | Temp 98.8°F | Ht 60.5 in | Wt 166.0 lb

## 2024-07-07 DIAGNOSIS — R600 Localized edema: Secondary | ICD-10-CM

## 2024-07-07 DIAGNOSIS — R000 Tachycardia, unspecified: Secondary | ICD-10-CM

## 2024-07-07 DIAGNOSIS — I10 Essential (primary) hypertension: Secondary | ICD-10-CM

## 2024-07-07 LAB — BASIC METABOLIC PANEL WITH GFR
BUN: 17 mg/dL (ref 6–23)
CO2: 30 meq/L (ref 19–32)
Calcium: 9.3 mg/dL (ref 8.4–10.5)
Chloride: 100 meq/L (ref 96–112)
Creatinine, Ser: 0.81 mg/dL (ref 0.40–1.20)
GFR: 67.17 mL/min (ref >60.00)
Glucose, Bld: 83 mg/dL (ref 70–99)
Potassium: 3.5 meq/L (ref 3.5–5.1)
Sodium: 138 meq/L (ref 135–145)

## 2024-07-07 NOTE — Progress Notes (Signed)
 Pathology results from colonoscopy came back normal, will see her for follow up as scheduled  RV

## 2024-07-07 NOTE — Progress Notes (Signed)
 Patient ID: Tracy Dixon, female    DOB: September 19, 1941, 83 y.o.   MRN: [id number]  This visit was conducted in person.  BP 136/70   Pulse 94   Temp 98.8 F (37.1 C) (Temporal)   Ht 5' 0.5 (1.537 m)   Wt 166 lb (75.3 kg)   SpO2 98%   BMI 31.89 kg/m    CC:  Chief Complaint  Patient presents with   Peripheral edema    Follow up from last office visit   Hypertension   Tachycardia    Subjective:   HPI: Tracy Dixon is a 83 y.o. female presenting on 07/07/2024 for Peripheral edema (Follow up from last office visit), Hypertension, and Tachycardia  Bilateral peripheral edema: Chronic issue with recent worsening She reports gradaully worseing swelling ni bilateral feet. She is on amlodipine  10 mg daily now in the last month given increased BPs.. see phone note 04/29/2024  Stopped hydrochlorothiazide  6/3 given hypokalemia. At last OV started lasix  20 mg daily with Kdur. Minimal improvement.  Stopped amlodipine  and changed to losartan  50 mg daily.  History of chronic venous insufficiency and varicose veins, followed by vascular status post sclerotherapy   Tachycardia: Noted at last OV  04/28/2024 and GI OV:  She is asymptomatic.  I did note that at her GI office visit it was noted at 117 heart rate. EKG in the ER was unremarkable although tachycardia. BMET and TSH nml.  CBC was normal in ER. No sign of dehydration.  She drinks minimal caffeine. Increased amlodipine  at that time to 10 mg daily    Today   She reports  significant improvement in swelling off amlodipine .  HR only slightly  above goal. BP at home good.  Using lasix  20 mg daily and Kdur with it. BP Readings from Last 3 Encounters:  07/07/24 136/70  07/01/24 128/72  05/26/24 122/60   ECHO ordered and  scheduled 08/20/2024   Relevant past medical, surgical, family and social history reviewed and updated as indicated. Interim medical history since our last visit reviewed. Allergies and medications reviewed and  updated. Outpatient Medications Prior to Visit  Medication Sig Dispense Refill   albuterol  (VENTOLIN  HFA) 108 (90 Base) MCG/ACT inhaler Inhale 2 puffs into the lungs every 6 (six) hours as needed for wheezing or shortness of breath. 8 g 0   atorvastatin  (LIPITOR) 20 MG tablet TAKE 1 TABLET BY MOUTH ONCE  DAILY 90 tablet 3   cetirizine (ZYRTEC) 10 MG tablet Take 10 mg by mouth daily as needed for allergies. Reported on 01/10/2016     cholecalciferol (VITAMIN D ) 1000 UNITS tablet Take 1,000 Units by mouth daily.     colestipol  (COLESTID ) 1 g tablet Take 1 tablet (1 g total) by mouth 2 (two) times daily. As needed 30 tablet 0   furosemide  (LASIX ) 20 MG tablet Take 1 tablet (20 mg total) by mouth daily as needed for edema or fluid (take with potassium). 30 tablet 3   losartan  (COZAAR ) 50 MG tablet Take 1 tablet (50 mg total) by mouth daily. 30 tablet 11   omeprazole (PRILOSEC OTC) 20 MG tablet Take 20 mg by mouth daily.     potassium chloride  SA (KLOR-CON  M) 20 MEQ tablet Take 1 tablet (20 mEq total) by mouth daily as needed (take only when taking lasix ). 30 tablet 3   No facility-administered medications prior to visit.     Per HPI unless specifically indicated in ROS section below Review of Systems  Constitutional:  Negative for fatigue and fever.  HENT:  Negative for congestion.   Eyes:  Negative for pain.  Respiratory:  Negative for cough and shortness of breath.   Cardiovascular:  Positive for leg swelling. Negative for chest pain and palpitations.  Gastrointestinal:  Negative for abdominal pain.  Genitourinary:  Negative for dysuria and vaginal bleeding.  Musculoskeletal:  Negative for back pain.  Neurological:  Negative for syncope, light-headedness and headaches.  Psychiatric/Behavioral:  Negative for dysphoric mood.    Objective:  BP 136/70   Pulse 94   Temp 98.8 F (37.1 C) (Temporal)   Ht 5' 0.5 (1.537 m)   Wt 166 lb (75.3 kg)   SpO2 98%   BMI 31.89 kg/m   Wt Readings  from Last 3 Encounters:  07/07/24 166 lb (75.3 kg)  07/01/24 165 lb 6.4 oz (75 kg)  05/26/24 175 lb 2 oz (79.4 kg)      Physical Exam Constitutional:      General: She is not in acute distress.    Appearance: Normal appearance. She is well-developed. She is not ill-appearing or toxic-appearing.  HENT:     Head: Normocephalic.     Right Ear: Hearing, tympanic membrane, ear canal and external ear normal. Tympanic membrane is not erythematous, retracted or bulging.     Left Ear: Hearing, tympanic membrane, ear canal and external ear normal. Tympanic membrane is not erythematous, retracted or bulging.     Nose: Mucosal edema present. No rhinorrhea.     Right Sinus: No maxillary sinus tenderness or frontal sinus tenderness.     Left Sinus: No maxillary sinus tenderness or frontal sinus tenderness.     Mouth/Throat:     Pharynx: Uvula midline.  Eyes:     General: Lids are normal. Lids are everted, no foreign bodies appreciated.     Conjunctiva/sclera: Conjunctivae normal.     Pupils: Pupils are equal, round, and reactive to light.  Neck:     Thyroid : No thyroid  mass or thyromegaly.     Vascular: No carotid bruit.     Trachea: Trachea normal.  Cardiovascular:     Rate and Rhythm: Normal rate and regular rhythm.     Pulses: Normal pulses.     Heart sounds: Normal heart sounds, S1 normal and S2 normal. No murmur heard.    No friction rub. No gallop.     Comments: Less than 1+ swelling bilateral lower legs, nonpitting, bilateral varicose veins. Pulmonary:     Effort: Pulmonary effort is normal. No tachypnea or respiratory distress.     Breath sounds: Normal breath sounds. No decreased breath sounds, wheezing, rhonchi or rales.  Abdominal:     General: Bowel sounds are normal.     Palpations: Abdomen is soft.     Tenderness: There is no abdominal tenderness.  Musculoskeletal:     Cervical back: Normal range of motion and neck supple.     Right lower leg: 1+ Edema present.     Left  lower leg: 1+ Edema present.  Skin:    General: Skin is warm and dry.     Findings: No rash.  Neurological:     Mental Status: She is alert.  Psychiatric:        Mood and Affect: Mood is not anxious or depressed.        Speech: Speech normal.        Behavior: Behavior normal. Behavior is cooperative.        Thought Content: Thought content normal.  Judgment: Judgment normal.       Results for orders placed or performed in visit on 07/07/24  Basic Metabolic Panel   Collection Time: 07/07/24  1:18 PM  Result Value Ref Range   Sodium 138 135 - 145 mEq/L   Potassium 3.5 3.5 - 5.1 mEq/L   Chloride 100 96 - 112 mEq/L   CO2 30 19 - 32 mEq/L   Glucose, Bld 83 70 - 99 mg/dL   BUN 17 6 - 23 mg/dL   Creatinine, Ser 9.18 0.40 - 1.20 mg/dL   GFR 32.82 >39.99 mL/min   Calcium  9.3 8.4 - 10.5 mg/dL    Assessment and Plan  Essential hypertension, benign Assessment & Plan: Stable control despite stopping amlodipine  and changing to losartan  50 mg daily. Due for repeat evaluation of basic metabolic panel on new ARB.  Orders: -     Basic metabolic panel with GFR  Tachycardia Assessment & Plan: Chronic, asymptomatic only slightly above goal despite being off calcium  channel blocker.  Will continue to follow.   Peripheral edema Assessment & Plan: Chronic, in part due to venous insufficiency, lymphadenopathy but significantly better off of calcium  channel blocker. Encouraged patient to try lymphedema pumps as recommended by vascular.      No follow-ups on file.   Greig Ring, MD

## 2024-07-08 NOTE — Assessment & Plan Note (Addendum)
 Chronic, in part due to venous insufficiency, lymphadenopathy but significantly better off of calcium  channel blocker. Encouraged patient to try lymphedema pumps as recommended by vascular.

## 2024-07-08 NOTE — Assessment & Plan Note (Signed)
 Chronic, asymptomatic only slightly above goal despite being off calcium  channel blocker.  Will continue to follow.

## 2024-07-08 NOTE — Assessment & Plan Note (Signed)
 Stable control despite stopping amlodipine  and changing to losartan  50 mg daily. Due for repeat evaluation of basic metabolic panel on new ARB.

## 2024-07-09 DIAGNOSIS — M48061 Spinal stenosis, lumbar region without neurogenic claudication: Secondary | ICD-10-CM | POA: Diagnosis not present

## 2024-07-09 DIAGNOSIS — M5416 Radiculopathy, lumbar region: Secondary | ICD-10-CM | POA: Diagnosis not present

## 2024-07-13 ENCOUNTER — Ambulatory Visit (INDEPENDENT_AMBULATORY_CARE_PROVIDER_SITE_OTHER): Admitting: Podiatry

## 2024-07-13 ENCOUNTER — Encounter: Payer: Self-pay | Admitting: Podiatry

## 2024-07-13 DIAGNOSIS — Q828 Other specified congenital malformations of skin: Secondary | ICD-10-CM | POA: Diagnosis not present

## 2024-07-13 DIAGNOSIS — M79671 Pain in right foot: Secondary | ICD-10-CM

## 2024-07-13 DIAGNOSIS — B351 Tinea unguium: Secondary | ICD-10-CM | POA: Diagnosis not present

## 2024-07-13 DIAGNOSIS — M79674 Pain in right toe(s): Secondary | ICD-10-CM

## 2024-07-13 DIAGNOSIS — M79675 Pain in left toe(s): Secondary | ICD-10-CM

## 2024-07-13 DIAGNOSIS — M79672 Pain in left foot: Secondary | ICD-10-CM

## 2024-07-17 DIAGNOSIS — M5416 Radiculopathy, lumbar region: Secondary | ICD-10-CM | POA: Diagnosis not present

## 2024-07-18 ENCOUNTER — Encounter: Payer: Self-pay | Admitting: Podiatry

## 2024-07-18 NOTE — Progress Notes (Signed)
  Subjective:  Patient ID: Tracy Dixon, female    DOB: 16-Jan-1941,  MRN: [id number]  NERY KALISZ presents to clinic today for painful porokeratotic lesion(s) b/l feet and painful mycotic toenails that limit ambulation. Painful toenails interfere with ambulation. Aggravating factors include wearing enclosed shoe gear. Pain is relieved with periodic professional debridement. Painful porokeratotic lesions are aggravated when weightbearing with and without shoegear. Pain is relieved with periodic professional debridement.  Chief Complaint  Patient presents with   RFC     RFC Non diabetic toenail trim. LOV with PCP 07/07/24.   New problem(s): None.   PCP is Avelina Greig BRAVO, MD.  Allergies  Allergen Reactions   Contrast Media [Iodinated Contrast Media] Hives   Crab Extract     Other reaction(s): Not available   Crab [Shellfish Allergy ] Hives    Review of Systems: Negative except as noted in the HPI.  Objective: No changes noted in today's physical examination. There were no vitals filed for this visit. Tracy Dixon is a pleasant 83 y.o. female WD, WN in NAD. AAO x 3.  Vascular Examination: Capillary refill time immediate b/l. Palpable pedal pulses. Pedal hair present b/l. No pain with calf compression b/l. Skin temperature gradient WNL b/l. No cyanosis or clubbing b/l. No ischemia or gangrene noted b/l. Trace edema noted BLE. Varicosities present b/l.  Neurological Examination: Sensation grossly intact b/l with 10 gram monofilament. Vibratory sensation intact b/l.   Dermatological Examination: Pedal skin with normal turgor, texture and tone b/l.  No open wounds. No interdigital macerations.   Toenails 1-5 b/l thick, discolored, elongated with subungual debris and pain on dorsal palpation.   Porokeratotic lesion(s) IPJ of right great toe, submet head 2 right foot, and plantarlateral aspect of midfoot right foot. No erythema, no edema, no drainage, no  fluctuance.  Musculoskeletal Examination: Muscle strength 5/5 to all LE muscle groups of b/l lower extremities. No pain, crepitus or joint limitation noted with ROM bilateral LE. No gross bony deformities bilaterally.  Radiographs: None  Assessment/Plan: 1. Pain due to onychomycosis of toenails of both feet   2. Porokeratosis   3. Foot pain, bilateral   Consent given for treatment. Patient examined. All patient's and/or POA's questions/concerns addressed on today's visit. Toenails 1-5 debrided in length and girth without incident. Porokeratotic lesion(s) IPJ of R hallux, submet head 2 right foot, and plantarlateral aspect of midfoot right foot pared and enucleated with sharp debridement without incident. Continue foot and shoe inspections daily. Monitor blood glucose per PCP/Endocrinologist's recommendations.Continue soft, supportive shoe gear daily. Report any pedal injuries to medical professional. Call office if there are any questions/concerns.  Return in about 3 months (around 10/13/2024).  Delon LITTIE Merlin, DPM      [place] LOCATION: [address] [place] [address]                   Office [phone]   [facility] LOCATION: [address] [place], [place] [address] Office [phone]

## 2024-07-23 DIAGNOSIS — K529 Noninfective gastroenteritis and colitis, unspecified: Secondary | ICD-10-CM | POA: Diagnosis not present

## 2024-07-28 ENCOUNTER — Telehealth: Payer: Self-pay

## 2024-07-28 NOTE — Telephone Encounter (Signed)
 SABRA

## 2024-07-28 NOTE — Telephone Encounter (Signed)
 Noted

## 2024-07-28 NOTE — Telephone Encounter (Signed)
 I spoke with pt and pt did not go to UC. I offered pt an appt at [facility] and pt said a doctor had already sent in Paxlovid for her and pt fever was down and pt felt better. Pt did not want to give name of provider (pt has doctors in her familY) UC & ED precautions given and pt voiced understanding. Sending note to Dr KATHEE sloan.

## 2024-08-12 ENCOUNTER — Encounter: Payer: Self-pay | Admitting: Family Medicine

## 2024-08-12 ENCOUNTER — Other Ambulatory Visit: Payer: Self-pay | Admitting: Family Medicine

## 2024-08-12 MED ORDER — LOSARTAN POTASSIUM 50 MG PO TABS: 50.0000 mg | ORAL_TABLET | Freq: Every day | ORAL | 3 refills | Status: AC

## 2024-08-20 ENCOUNTER — Ambulatory Visit
Admission: RE | Admit: 2024-08-20 | Discharge: 2024-08-20 | Disposition: A | Discharge status: Home / Self Care | Source: Ambulatory Visit | Attending: Family Medicine | Admitting: Family Medicine

## 2024-08-20 ENCOUNTER — Ambulatory Visit: Payer: Self-pay | Admitting: Family Medicine

## 2024-08-20 DIAGNOSIS — I119 Hypertensive heart disease without heart failure: Secondary | ICD-10-CM | POA: Insufficient documentation

## 2024-08-20 DIAGNOSIS — R600 Localized edema: Secondary | ICD-10-CM | POA: Insufficient documentation

## 2024-08-20 DIAGNOSIS — E785 Hyperlipidemia, unspecified: Secondary | ICD-10-CM | POA: Insufficient documentation

## 2024-08-20 LAB — ECHOCARDIOGRAM COMPLETE
AR max vel: 2.38 cm2
AV Area VTI: 2.63 cm2
AV Area mean vel: 2.23 cm2
AV Mean grad: 3 mmHg
AV Peak grad: 4.7 mmHg
Ao pk vel: 1.09 m/s
Area-P 1/2: 4.86 cm2
MV VTI: 3.22 cm2
S' Lateral: 2.6 cm

## 2024-08-20 NOTE — Progress Notes (Signed)
*  PRELIMINARY RESULTS* Echocardiogram 2D Echocardiogram has been performed.  Floydene Harder 08/20/2024, 11:56 AM

## 2024-08-25 ENCOUNTER — Ambulatory Visit (INDEPENDENT_AMBULATORY_CARE_PROVIDER_SITE_OTHER)

## 2024-08-25 DIAGNOSIS — Z23 Encounter for immunization: Secondary | ICD-10-CM | POA: Diagnosis not present

## 2024-09-03 DIAGNOSIS — M25551 Pain in right hip: Secondary | ICD-10-CM | POA: Diagnosis not present

## 2024-09-03 DIAGNOSIS — M48061 Spinal stenosis, lumbar region without neurogenic claudication: Secondary | ICD-10-CM | POA: Diagnosis not present

## 2024-09-03 DIAGNOSIS — M25552 Pain in left hip: Secondary | ICD-10-CM | POA: Diagnosis not present

## 2024-09-03 DIAGNOSIS — M5416 Radiculopathy, lumbar region: Secondary | ICD-10-CM | POA: Diagnosis not present

## 2024-09-03 DIAGNOSIS — M160 Bilateral primary osteoarthritis of hip: Secondary | ICD-10-CM | POA: Diagnosis not present

## 2024-09-14 ENCOUNTER — Encounter: Payer: Self-pay | Admitting: Podiatry

## 2024-09-14 ENCOUNTER — Ambulatory Visit (INDEPENDENT_AMBULATORY_CARE_PROVIDER_SITE_OTHER): Admitting: Vascular Surgery

## 2024-09-14 ENCOUNTER — Ambulatory Visit (INDEPENDENT_AMBULATORY_CARE_PROVIDER_SITE_OTHER): Admitting: Podiatry

## 2024-09-14 DIAGNOSIS — Q828 Other specified congenital malformations of skin: Secondary | ICD-10-CM

## 2024-09-14 DIAGNOSIS — M79671 Pain in right foot: Secondary | ICD-10-CM | POA: Diagnosis not present

## 2024-09-14 DIAGNOSIS — M79674 Pain in right toe(s): Secondary | ICD-10-CM | POA: Diagnosis not present

## 2024-09-14 DIAGNOSIS — M79675 Pain in left toe(s): Secondary | ICD-10-CM

## 2024-09-14 DIAGNOSIS — B351 Tinea unguium: Secondary | ICD-10-CM | POA: Diagnosis not present

## 2024-09-16 DIAGNOSIS — M25552 Pain in left hip: Secondary | ICD-10-CM | POA: Diagnosis not present

## 2024-09-17 ENCOUNTER — Ambulatory Visit (INDEPENDENT_AMBULATORY_CARE_PROVIDER_SITE_OTHER): Admitting: Vascular Surgery

## 2024-09-17 ENCOUNTER — Telehealth (INDEPENDENT_AMBULATORY_CARE_PROVIDER_SITE_OTHER): Payer: Self-pay

## 2024-09-17 ENCOUNTER — Encounter (INDEPENDENT_AMBULATORY_CARE_PROVIDER_SITE_OTHER): Payer: Self-pay | Admitting: Vascular Surgery

## 2024-09-17 VITALS — BP 159/87 | HR 98 | Resp 16 | Ht 61.0 in | Wt 155.4 lb

## 2024-09-17 DIAGNOSIS — I10 Essential (primary) hypertension: Secondary | ICD-10-CM

## 2024-09-17 DIAGNOSIS — I872 Venous insufficiency (chronic) (peripheral): Secondary | ICD-10-CM | POA: Diagnosis not present

## 2024-09-17 DIAGNOSIS — J4520 Mild intermittent asthma, uncomplicated: Secondary | ICD-10-CM

## 2024-09-17 DIAGNOSIS — I890 Lymphedema, not elsewhere classified: Secondary | ICD-10-CM | POA: Diagnosis not present

## 2024-09-17 NOTE — Telephone Encounter (Signed)
 Patient had an appt today and was questioning the amount that she had to pay regarding her lymphedema pump from [employer]. I reached out to Adina Collum and he will contact the patient to again go over all aspects of the paperwork that was signed and cost just as was done in August.

## 2024-09-20 NOTE — Progress Notes (Signed)
  Subjective:  Patient ID: Tracy Dixon, female    DOB: 1941-09-17,  MRN: [id number]  Tracy Dixon presents to clinic today for painful porokeratotic lesion(s) right lower extremity and painful mycotic toenails that limit ambulation. Painful toenails interfere with ambulation. Aggravating factors include wearing enclosed shoe gear. Pain is relieved with periodic professional debridement. Painful porokeratotic lesions are aggravated when weightbearing with and without shoegear. Pain is relieved with periodic professional debridement.  Chief Complaint  Patient presents with   Toe Pain    Dr. Avelina is her PCP. Her last visit was in August. She denies being diabetic   New problem(s): None.   PCP is Avelina, Amy E, MD.  Allergies  Allergen Reactions   Contrast Media [Iodinated Contrast Media] Hives   Crab Extract     Other reaction(s): Not available   Shellfish Allergy  Hives    Shellfish (substance)    Review of Systems: Negative except as noted in the HPI.  Objective: No changes noted in today's physical examination. There were no vitals filed for this visit. Tracy Dixon is a pleasant 83 y.o. female WD, WN in NAD. AAO x 3.  Neurovascular status intact b/l and symmetrically. Trace edema noted BLE. Varicosities present b/l.  Dermatological Examination: Pedal skin with normal turgor, texture and tone b/l.  No open wounds. No interdigital macerations.   Toenails 1-5 b/l thick, discolored, elongated with subungual debris and pain on dorsal palpation.   Porokeratotic lesion(s) IPJ of right great toe, submet head 2 right foot, and plantarlateral aspect of midfoot right foot. No erythema, no edema, no drainage, no fluctuance.  Musculoskeletal Examination: Muscle strength 5/5 to all LE muscle groups of b/l lower extremities. No pain, crepitus or joint limitation noted with ROM bilateral LE. No gross bony deformities bilaterally.  Radiographs: None  Assessment/Plan: 1. Pain  due to onychomycosis of toenails of both feet   2. Porokeratosis   3. Right foot pain   Patient was evaluated and treated. All patient's and/or POA's questions/concerns addressed on today's visit. Mycotic toenails 1-5 b/l debrided in length and girth without incident. Porokeratotic lesion(s) medial IPJ of right great toe, submet head 2 right foot, and plantarlateral aspect of midfoot right foot pared with sharp debridement without incident. Continue soft, supportive shoe gear daily. Report any pedal injuries to medical professional. Call office if there are any questions/concerns.  Return in about 9 weeks (around 11/16/2024).  Delon LITTIE Merlin, DPM      [place] LOCATION: [address], [place] [address]                   Office [phone]   [facility] LOCATION: [address] [place], [place] [address] Office [phone]

## 2024-09-27 ENCOUNTER — Encounter (INDEPENDENT_AMBULATORY_CARE_PROVIDER_SITE_OTHER): Payer: Self-pay | Admitting: Vascular Surgery

## 2024-09-27 NOTE — Progress Notes (Signed)
 MRN : [id number]  Tracy Dixon is a 83 y.o. (1941-06-21) female who presents with chief complaint of legs swell.  History of Present Illness:   The patient returns to the office for followup status post laser ablation of the left great saphenous vein on 03/16/2021.     The swelling has persisted but with the lymph pump the patient states the swelling is much better controlled.  She is disappointed at the price that she was required to pay for the pump.  She does not feel that this was necessarily adequately explained to her.  The pain associated with swelling is essentially eliminated. There have not been any interval development of a ulcerations or wounds.  No episodes of cellulitis or infection over the past 12 months  The patient denies problems with the pump, noting it is working well and the leggings are in good condition.  Since the previous visit the patient has been wearing graduated compression stockings and using the lymph pump on a routine basis and  has noted significant improvement in the lymphedema.   Patient stated the lymph pump has been helpful in treating the lymphedema.   Post laser ultrasound showed successful ablation of the left great saphenous vein    Duplex ultrasound of the left lower extremity venous system obtained today demonstrates normal deep venous system bilaterally.  There is no evidence of reflux in the right superficial system.  On the left there is successful ablation of the great saphenous vein.  There is a varicose vein in the proximal thigh which does demonstrate reflux.    Current Meds  Medication Sig   albuterol  (VENTOLIN  HFA) 108 (90 Base) MCG/ACT inhaler Inhale 2 puffs into the lungs every 6 (six) hours as needed for wheezing or shortness of breath.   atorvastatin  (LIPITOR) 20 MG tablet TAKE 1 TABLET BY MOUTH ONCE  DAILY   cetirizine (ZYRTEC) 10 MG tablet Take 10 mg by mouth daily as needed for allergies. Reported  on 01/10/2016   cholecalciferol (VITAMIN D ) 1000 UNITS tablet Take 1,000 Units by mouth daily.   colestipol  (COLESTID ) 1 g tablet Take 1 tablet (1 g total) by mouth 2 (two) times daily. As needed   losartan  (COZAAR ) 50 MG tablet Take 1 tablet (50 mg total) by mouth daily.   omeprazole (PRILOSEC OTC) 20 MG tablet Take 20 mg by mouth daily.    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[facility])    followed by [facility], dx in [place]    Allergy     Arthritis    Asthma    Complication of anesthesia    stays sleepy   Diverticulosis of colon    Gallstones    Hyperlipidemia    Hypertension    Osteoarthritis    Symptomatic mammary hypertrophy     Past Surgical History:  Procedure Laterality Date   Bilateral tubal ligation  2005   BREAST REDUCTION SURGERY Bilateral 06/04/2018   Procedure: MAMMARY REDUCTION  (BREAST);  Surgeon: Lowery Estefana RAMAN, DO;  Location: [facility];  Service: Plastics;  Laterality: Bilateral;   Carotid doppler  07/2006   Neg   CESAREAN SECTION  2005   CHOLECYSTECTOMY  1982   COLONOSCOPY N/A 07/01/2024   Procedure: COLONOSCOPY;  Surgeon: Unk Corinn Skiff, MD;  Location: [facility];  Service: Gastroenterology;  Laterality: N/A;   DECOMPRESSIVE LUMBAR LAMINECTOMY LEVEL 3  06/10/2013   Procedure: DECOMPRESSIVE LUMBAR LAMINECTOMY LEVEL 3;  Surgeon: Donaciano Sprang, MD;  Location: [facility] OR;  Service: Orthopedics;;  lumbar three to five decompression   JOINT REPLACEMENT     MRI brain  2007   acoustic neuroma (check yearly with MRI)   PFT's  12/2006   (-) FEV1/FVC 79%, FEV1 84%   REDUCTION MAMMAPLASTY Bilateral 05/2018   REPLACEMENT TOTAL KNEE BILATERAL  2005   TONSILLECTOMY  2005   TRIGGER FINGER RELEASE Left 09/05/2022   TUBAL LIGATION      Social History Social History   Tobacco Use   Smoking status: Former    Current packs/day: 0.00    Types: Cigarettes    Start date: 06/02/1958    Quit date: 06/02/1973    Years since  quitting: 51.3   Smokeless tobacco: Never   Tobacco comments:    few cigs/day  Vaping Use   Vaping status: Never Used  Substance Use Topics   Alcohol use: No   Drug use: No    Family History Family History  Problem Relation Age of Onset   Cancer Mother        Lung CA   Stroke Mother        CVA   Cancer Father        Prostate   Heart disease Neg Hx    Breast cancer Neg Hx     Allergies  Allergen Reactions   Contrast Media [Iodinated Contrast Media] Hives   Crab Extract     Other reaction(s): Not available   Shellfish Allergy  Hives    Shellfish (substance)     REVIEW OF SYSTEMS (Negative unless checked)  Constitutional: [] Weight loss  [] Fever  [] Chills Cardiac: [] Chest pain   [] Chest pressure   [] Palpitations   [] Shortness of breath when laying flat   [] Shortness of breath with exertion. Vascular:  [] Pain in legs with walking   [x] Pain in legs with standing  [] History of DVT   [] Phlebitis   [x] Swelling in legs   [] Varicose veins   [] Non-healing ulcers Pulmonary:   [] Uses home oxygen   [] Productive cough   [] Hemoptysis   [] Wheeze  [] COPD   [] Asthma Neurologic:  [] Dizziness   [] Seizures   [] History of stroke   [] History of TIA  [] Aphasia   [] Vissual changes   [] Weakness or numbness in arm   [] Weakness or numbness in leg Musculoskeletal:   [] Joint swelling   [] Joint pain   [] Low back pain Hematologic:  [] Easy bruising  [] Easy bleeding   [] Hypercoagulable state   [] Anemic Gastrointestinal:  [] Diarrhea   [] Vomiting  [] Gastroesophageal reflux/heartburn   [] Difficulty swallowing. Genitourinary:  [] Chronic kidney disease   [] Difficult urination  [] Frequent urination   [] Blood in urine Skin:  [] Rashes   [] Ulcers  Psychological:  [] History of anxiety   []  History of major depression.  Physical Examination  Vitals:   09/17/24 1152  BP: (!) 159/87  Pulse: 98  Resp: 16  Weight: 155 lb 6.4 oz (70.5 kg)  Height: 5' 1 (1.549 m)   Body mass index is 29.36 kg/m. Gen: WD/WN,  NAD Head: [place]/AT, No temporalis wasting.  Ear/Nose/Throat: Hearing grossly intact, nares w/o erythema or drainage, pinna without lesions Eyes: PER, EOMI, sclera nonicteric.  Neck: Supple, no gross masses.  No JVD.  Pulmonary:  Good air movement, no audible wheezing, no use of accessory muscles.  Cardiac: RRR, precordium not hyperdynamic. Vascular:  scattered  varicosities present bilaterally.  Mild venous stasis changes to the legs bilaterally.  2+ soft pitting edema, CEAP [id number]  Vessel Right Left  Radial Palpable Palpable  Gastrointestinal: soft, non-distended. No guarding/no peritoneal signs.  Musculoskeletal: M/S 5/5 throughout.  No deformity.  Neurologic: CN 2-12 intact. Pain and light touch intact in extremities.  Symmetrical.  Speech is fluent. Motor exam as listed above. Psychiatric: Judgment intact, Mood & affect appropriate for pt's clinical situation. Dermatologic: Venous rashes no ulcers noted.  No changes consistent with cellulitis. Lymph : No lichenification or skin changes of chronic lymphedema.  CBC Lab Results  Component Value Date   WBC 7.5 04/24/2024   HGB 12.7 04/24/2024   HCT 38.8 04/24/2024   MCV 78.1 (L) 04/24/2024   PLT 281 04/24/2024    BMET    Component Value Date/Time   NA 138 07/07/2024 1318   K 3.5 07/07/2024 1318   CL 100 07/07/2024 1318   CO2 30 07/07/2024 1318   GLUCOSE 83 07/07/2024 1318   BUN 17 07/07/2024 1318   CREATININE 0.81 07/07/2024 1318   CALCIUM  9.3 07/07/2024 1318   GFRNONAA >60 04/24/2024 1112   GFRAA >60 04/20/2019 1054   CrCl cannot be calculated (Patient's most recent lab result is older than the maximum 21 days allowed.).  COAG Lab Results  Component Value Date   INR 1.1 ratio (H) 03/30/2009    Radiology No results found.   Assessment/Plan 1. Lymphedema (Primary) Recommend:  No surgery or intervention at this point in time.    I have reviewed my discussion with the patient regarding lymphedema and why it   causes symptoms.  Patient will continue wearing graduated compression on a daily basis. The patient should put the compression on first thing in the morning and removing them in the evening. The patient should not sleep in the compression.   In addition, behavioral modification throughout the day will be continued.  This will include frequent elevation (such as in a recliner), use of over the counter pain medications as needed and exercise such as walking.  The systemic causes for chronic edema such as liver, kidney and cardiac etiologies does not appear to have significant changed over the past year.    The patient will continue aggressive use of the  lymph pump.  This will continue to improve the edema control and prevent sequela such as ulcers and infections.   The patient will follow-up with me on an annual basis.   2. Chronic venous insufficiency Recommend:  No surgery or intervention at this point in time.    I have reviewed my discussion with the patient regarding lymphedema and why it  causes symptoms.  Patient will continue wearing graduated compression on a daily basis. The patient should put the compression on first thing in the morning and removing them in the evening. The patient should not sleep in the compression.   In addition, behavioral modification throughout the day will be continued.  This will include frequent elevation (such as in a recliner), use of over the counter pain medications as needed and exercise such as walking.  The systemic causes for chronic edema such as liver, kidney and cardiac etiologies does not appear to have significant changed over the past year.    The patient will continue aggressive use of the  lymph pump.  This will continue to improve the edema control and prevent sequela such as ulcers and infections.   The patient will follow-up with me on an annual  basis.   3. Essential hypertension, benign Continue antihypertensive medications as already  ordered, these medications have been reviewed and there are no changes at this time.  4. Mild intermittent asthma without complication Continue pulmonary medications and aerosols as already ordered, these medications have been reviewed and there are no changes at this time.     Cordella Shawl, MD  09/27/2024 2:02 PM

## 2024-10-08 DIAGNOSIS — Z23 Encounter for immunization: Secondary | ICD-10-CM | POA: Diagnosis not present

## 2024-10-09 ENCOUNTER — Encounter: Payer: Self-pay | Admitting: Family Medicine

## 2024-10-14 ENCOUNTER — Other Ambulatory Visit: Payer: Self-pay | Admitting: Family Medicine

## 2024-10-14 MED ORDER — IMIPRAMINE HCL 10 MG PO TABS: 10.0000 mg | ORAL_TABLET | Freq: Every day | ORAL | 3 refills | Status: DC

## 2024-10-28 ENCOUNTER — Encounter: Payer: Self-pay | Admitting: Family Medicine

## 2024-11-04 DIAGNOSIS — H18593 Other hereditary corneal dystrophies, bilateral: Secondary | ICD-10-CM | POA: Diagnosis not present

## 2024-11-04 DIAGNOSIS — M5416 Radiculopathy, lumbar region: Secondary | ICD-10-CM | POA: Diagnosis not present

## 2024-11-04 DIAGNOSIS — H2511 Age-related nuclear cataract, right eye: Secondary | ICD-10-CM | POA: Diagnosis not present

## 2024-11-04 DIAGNOSIS — H31002 Unspecified chorioretinal scars, left eye: Secondary | ICD-10-CM | POA: Diagnosis not present

## 2024-11-04 DIAGNOSIS — M48061 Spinal stenosis, lumbar region without neurogenic claudication: Secondary | ICD-10-CM | POA: Diagnosis not present

## 2024-11-04 DIAGNOSIS — M160 Bilateral primary osteoarthritis of hip: Secondary | ICD-10-CM | POA: Diagnosis not present

## 2024-11-04 DIAGNOSIS — H15102 Unspecified episcleritis, left eye: Secondary | ICD-10-CM | POA: Diagnosis not present

## 2024-11-04 MED ORDER — IMIPRAMINE HCL 10 MG PO TABS: 20.0000 mg | ORAL_TABLET | Freq: Every day | ORAL | 0 refills | Status: DC

## 2024-11-16 ENCOUNTER — Ambulatory Visit (INDEPENDENT_AMBULATORY_CARE_PROVIDER_SITE_OTHER): Admitting: Podiatry

## 2024-11-16 ENCOUNTER — Encounter: Payer: Self-pay | Admitting: Podiatry

## 2024-11-16 DIAGNOSIS — M79674 Pain in right toe(s): Secondary | ICD-10-CM | POA: Diagnosis not present

## 2024-11-16 DIAGNOSIS — M79671 Pain in right foot: Secondary | ICD-10-CM

## 2024-11-16 DIAGNOSIS — Q828 Other specified congenital malformations of skin: Secondary | ICD-10-CM

## 2024-11-16 DIAGNOSIS — B351 Tinea unguium: Secondary | ICD-10-CM | POA: Diagnosis not present

## 2024-11-16 DIAGNOSIS — M79675 Pain in left toe(s): Secondary | ICD-10-CM | POA: Diagnosis not present

## 2024-11-16 NOTE — Progress Notes (Unsigned)
"  °  Subjective:  Patient ID: Tracy Dixon, female    DOB: [birth year],  MRN: [id number]  [name] presents to clinic today for painful porokeratotic lesion(s) right foot and painful mycotic toenails that limit ambulation. Painful toenails interfere with ambulation. Aggravating factors include wearing enclosed shoe gear. Pain is relieved with periodic professional debridement. Painful porokeratotic lesions are aggravated when weightbearing with and without shoegear. Pain is relieved with periodic professional debridement.  Chief Complaint  Patient presents with   Nail Problem    She saw her PCP this month.    New problem(s): None.   PCP is Bedsole, Amy E, MD.  Allergies[1]  Review of Systems: Negative except as noted in the HPI.  Objective: No changes noted in today's physical examination. There were no vitals filed for this visit. Tracy Dixon is a pleasant 83 y.o. female WD, WN in NAD. AAO x 3.  Neurovascular status intact b/l and symmetrically. Trace edema noted BLE. Varicosities present b/l.  Dermatological Examination: Pedal skin with normal turgor, texture and tone b/l.  No open wounds. No interdigital macerations.   Toenails 1-5 b/l thick, discolored, elongated with subungual debris and pain on dorsal palpation.   Porokeratotic lesion(s) IPJ of right great toe, submet head 2 right foot, and plantarlateral aspect of midfoot right foot. No erythema, no edema, no drainage, no fluctuance.  Musculoskeletal Examination: Muscle strength 5/5 to all LE muscle groups of b/l lower extremities. No pain, crepitus or joint limitation noted with ROM bilateral LE. No gross bony deformities bilaterally.  Radiographs: None  Assessment/Plan: 1. Pain due to onychomycosis of toenails of both feet   2. Porokeratosis   3. Right foot pain    {Jgplan:[id number]::-Patient/POA to call should there be question/concern in the interim.}   Return in about 3 months (around  02/14/2025).  Delon LITTIE Merlin, DPM      [place] LOCATION: [address], [place] [address]                   Office [phone]   [facility] LOCATION: [address] [place], [place] [address] Office [phone]     [1]  Allergies Allergen Reactions   Contrast Media [Iodinated Contrast Media] Hives   Crab Extract     Other reaction(s): Not available   Shellfish Allergy  Hives    Shellfish (substance)   "

## 2024-12-04 ENCOUNTER — Encounter: Payer: Self-pay | Admitting: Family Medicine

## 2024-12-10 ENCOUNTER — Other Ambulatory Visit: Payer: Self-pay | Admitting: Family Medicine

## 2024-12-10 MED ORDER — IMIPRAMINE HCL 25 MG PO TABS: 25.0000 mg | ORAL_TABLET | Freq: Every day | ORAL | 11 refills | Status: AC

## 2024-12-15 ENCOUNTER — Ambulatory Visit: Admitting: Family Medicine

## 2024-12-15 ENCOUNTER — Encounter: Payer: Self-pay | Admitting: Family Medicine

## 2024-12-15 VITALS — BP 130/90 | HR 106 | Temp 98.2°F | Ht 60.5 in | Wt 159.4 lb

## 2024-12-15 DIAGNOSIS — I7300 Raynaud's syndrome without gangrene: Secondary | ICD-10-CM | POA: Diagnosis not present

## 2024-12-15 DIAGNOSIS — G8929 Other chronic pain: Secondary | ICD-10-CM

## 2024-12-15 DIAGNOSIS — R0989 Other specified symptoms and signs involving the circulatory and respiratory systems: Secondary | ICD-10-CM | POA: Insufficient documentation

## 2024-12-15 DIAGNOSIS — M5441 Lumbago with sciatica, right side: Secondary | ICD-10-CM

## 2024-12-15 DIAGNOSIS — M48061 Spinal stenosis, lumbar region without neurogenic claudication: Secondary | ICD-10-CM

## 2024-12-15 DIAGNOSIS — M5442 Lumbago with sciatica, left side: Secondary | ICD-10-CM | POA: Diagnosis not present

## 2024-12-15 NOTE — Progress Notes (Signed)
 "   Patient ID: Tracy Dixon, female    DOB: 11-05-1941, 84 y.o.   MRN: [id number]  This visit was conducted in person.  BP (!) 130/90   Pulse (!) 106   Temp 98.2 F (36.8 C) (Temporal)   Ht 5' 0.5 (1.537 m)   Wt 159 lb 6 oz (72.3 kg)   SpO2 99%   BMI 30.61 kg/m    CC:  Chief Complaint  Patient presents with   Medication Management    Wants to discuss Imipramine    Nasal Congestion    Runny Nose all the time Also has had a couple of nose bleed    Nail Problem    States Finger Nails turn blue every now and then    Subjective:   HPI: Tracy Dixon is a 84 y.o. female presenting on 12/15/2024 for Medication Management (Wants to discuss Imipramine ), Nasal Congestion (Runny Nose all the time/Also has had a couple of nose bleed/), and Nail Problem (States Finger Nails turn blue every now and then)   Chronic runny nose.. using nasonex 2 sprays per nostril... started 2 days ago.   Occ seeing finger nails turing blue  Chronic lumbar spinal stenosis, DDD followed by [facility] Dr. Etta, Dr. [facility].  Recent steroid injection December 2025 In the last several months she has started imipramine  for chronic pain. Now on 25 mg at bedtime.  Currently no pain in legs from nerves.. back pain is much better.  Hard to tell if improvement is from [facility] or medication.   SE of dry mouth. BMs now regualr, no longer diarrhea. No oversedati  Occ nose bleeds.on in Am.   Lyrica  cause swelling in past.  Relevant past medical, surgical, family and social history reviewed and updated as indicated. Interim medical history since our last visit reviewed. Allergies, medications reviewed and updated. Outpatient Medications Prior to Visit  Medication Sig Dispense Refill   albuterol  (VENTOLIN  HFA) 108 (90 Base) MCG/ACT inhaler Inhale 2 puffs into the lungs every 6 (six) hours as needed for wheezing or shortness of breath. 8 g 0   atorvastatin  (LIPITOR) 20 MG tablet TAKE 1 TABLET BY MOUTH ONCE   DAILY 90 tablet 3   cetirizine (ZYRTEC) 10 MG tablet Take 10 mg by mouth daily as needed for allergies. Reported on 01/10/2016     cholecalciferol (VITAMIN D ) 1000 UNITS tablet Take 1,000 Units by mouth daily.     colestipol  (COLESTID ) 1 g tablet Take 1 tablet (1 g total) by mouth 2 (two) times daily. As needed 30 tablet 0   imipramine  (TOFRANIL ) 25 MG tablet Take 1 tablet (25 mg total) by mouth at bedtime. 30 tablet 11   losartan  (COZAAR ) 50 MG tablet Take 1 tablet (50 mg total) by mouth daily. 90 tablet 3   omeprazole (PRILOSEC OTC) 20 MG tablet Take 20 mg by mouth daily.     furosemide  (LASIX ) 20 MG tablet Take 1 tablet (20 mg total) by mouth daily as needed for edema or fluid (take with potassium). 30 tablet 3   potassium chloride  SA (KLOR-CON  M) 20 MEQ tablet Take 1 tablet (20 mEq total) by mouth daily as needed (take only when taking lasix ). 30 tablet 3   No facility-administered medications prior to visit.     Per HPI unless specifically indicated in ROS section below Review of Systems  Constitutional:  Negative for fatigue and fever.  HENT:  Positive for rhinorrhea. Negative for congestion.   Eyes:  Negative for pain.  Respiratory:  Negative for cough and shortness of breath.   Cardiovascular:  Negative for chest pain, palpitations and leg swelling.  Gastrointestinal:  Negative for abdominal pain.  Genitourinary:  Negative for dysuria and vaginal bleeding.  Musculoskeletal:  Negative for back pain.  Neurological:  Negative for syncope, light-headedness and headaches.  Psychiatric/Behavioral:  Negative for dysphoric mood.    Objective:  BP (!) 130/90   Pulse (!) 106   Temp 98.2 F (36.8 C) (Temporal)   Ht 5' 0.5 (1.537 m)   Wt 159 lb 6 oz (72.3 kg)   SpO2 99%   BMI 30.61 kg/m   Wt Readings from Last 3 Encounters:  12/15/24 159 lb 6 oz (72.3 kg)  09/17/24 155 lb 6.4 oz (70.5 kg)  07/07/24 166 lb (75.3 kg)      Physical Exam Constitutional:      General: She is not in  acute distress.    Appearance: Normal appearance. She is well-developed. She is not ill-appearing or toxic-appearing.  HENT:     Head: Normocephalic.     Right Ear: Hearing, tympanic membrane, ear canal and external ear normal. Tympanic membrane is not erythematous, retracted or bulging.     Left Ear: Hearing, tympanic membrane, ear canal and external ear normal. Tympanic membrane is not erythematous, retracted or bulging.     Nose: No mucosal edema or rhinorrhea.     Right Sinus: No maxillary sinus tenderness or frontal sinus tenderness.     Left Sinus: No maxillary sinus tenderness or frontal sinus tenderness.     Mouth/Throat:     Pharynx: Uvula midline.  Eyes:     General: Lids are normal. Lids are everted, no foreign bodies appreciated.     Conjunctiva/sclera: Conjunctivae normal.     Pupils: Pupils are equal, round, and reactive to light.  Neck:     Thyroid : No thyroid  mass or thyromegaly.     Vascular: No carotid bruit.     Trachea: Trachea normal.  Cardiovascular:     Rate and Rhythm: Normal rate and regular rhythm.     Pulses: Normal pulses.          Radial pulses are 2+ on the right side and 2+ on the left side.     Heart sounds: Normal heart sounds, S1 normal and S2 normal. No murmur heard.    No friction rub. No gallop.  Pulmonary:     Effort: Pulmonary effort is normal. No tachypnea or respiratory distress.     Breath sounds: Normal breath sounds. No decreased breath sounds, wheezing, rhonchi or rales.  Abdominal:     General: Bowel sounds are normal.     Palpations: Abdomen is soft.     Tenderness: There is no abdominal tenderness.  Musculoskeletal:     Cervical back: Normal range of motion and neck supple.     Right lower leg: No edema.     Left lower leg: No edema.  Skin:    General: Skin is warm and dry.     Findings: No rash.  Neurological:     Mental Status: She is alert.  Psychiatric:        Mood and Affect: Mood is not anxious or depressed.         Speech: Speech normal.        Behavior: Behavior normal. Behavior is cooperative.        Thought Content: Thought content normal.        Judgment: Judgment normal.  Results for orders placed or performed during the hospital encounter of 08/20/24  ECHOCARDIOGRAM COMPLETE   Collection Time: 08/20/24 11:56 AM  Result Value Ref Range   Ao pk vel 1.09 m/s   AV Area VTI 2.63 cm2   AR max vel 2.38 cm2   AV Mean grad 3.0 mmHg   AV Peak grad 4.7 mmHg   S' Lateral 2.60 cm   AV Area mean vel 2.23 cm2   Area-P 1/2 4.86 cm2   MV VTI 3.22 cm2   Est EF 55 - 60%     Assessment and Plan  There are no diagnoses linked to this encounter.  No follow-ups on file.   Greig Ring, MD  "

## 2024-12-15 NOTE — Assessment & Plan Note (Signed)
 Chronic, symptoms most consistent with Raynaud's syndrome.  Recommended patient keeping hands warm, wearing gloves.  If symptoms worsen can consider calcium  channel blocker as treatment but she declined at this time.

## 2024-12-15 NOTE — Assessment & Plan Note (Signed)
 Chronic, possibly related to allergies.  She has started nasal steroid spray.  Will allow this 2 to 4 weeks for trial. Continue Claritin 10 mg daily

## 2024-12-15 NOTE — Assessment & Plan Note (Addendum)
"   Secondary to known spinal stenosis  Significant improvement in leg symptoms on the left likely secondary to recent double epidural spinal injections plus or minus benefit from imipramine . Discussed possibly weaning off imipramine  but given she is doing very well she wishes to remain on it.  Continue imipramine  25 mg p.o. nightly. Continue to follow-up with PMNR Dr. Etta "

## 2024-12-24 ENCOUNTER — Encounter: Payer: Self-pay | Admitting: Family Medicine

## 2025-01-20 ENCOUNTER — Other Ambulatory Visit

## 2025-01-27 ENCOUNTER — Ambulatory Visit

## 2025-01-28 ENCOUNTER — Encounter: Admitting: Family Medicine

## 2025-02-15 ENCOUNTER — Ambulatory Visit: Admitting: Podiatry

## 2025-09-16 ENCOUNTER — Ambulatory Visit (INDEPENDENT_AMBULATORY_CARE_PROVIDER_SITE_OTHER): Admitting: Vascular Surgery
